# Patient Record
Sex: Female | Born: 1940
Health system: Southern US, Community
[De-identification: ages and names within clinical notes are randomized; demographics above are authoritative.]

## PROBLEM LIST (undated history)

## (undated) DIAGNOSIS — Z87440 Personal history of urinary (tract) infections: Secondary | ICD-10-CM

## (undated) DIAGNOSIS — T8859XA Other complications of anesthesia, initial encounter: Secondary | ICD-10-CM

## (undated) DIAGNOSIS — J189 Pneumonia, unspecified organism: Secondary | ICD-10-CM

## (undated) DIAGNOSIS — M810 Age-related osteoporosis without current pathological fracture: Secondary | ICD-10-CM

## (undated) DIAGNOSIS — K649 Unspecified hemorrhoids: Secondary | ICD-10-CM

## (undated) DIAGNOSIS — I10 Essential (primary) hypertension: Secondary | ICD-10-CM

## (undated) DIAGNOSIS — K6289 Other specified diseases of anus and rectum: Secondary | ICD-10-CM

## (undated) DIAGNOSIS — H269 Unspecified cataract: Secondary | ICD-10-CM

## (undated) DIAGNOSIS — Z8719 Personal history of other diseases of the digestive system: Secondary | ICD-10-CM

## (undated) DIAGNOSIS — R32 Unspecified urinary incontinence: Secondary | ICD-10-CM

## (undated) DIAGNOSIS — J45909 Unspecified asthma, uncomplicated: Secondary | ICD-10-CM

## (undated) DIAGNOSIS — Z860100 Personal history of colon polyps, unspecified: Secondary | ICD-10-CM

## (undated) DIAGNOSIS — Z87442 Personal history of urinary calculi: Secondary | ICD-10-CM

## (undated) DIAGNOSIS — K5790 Diverticulosis of intestine, part unspecified, without perforation or abscess without bleeding: Secondary | ICD-10-CM

## (undated) DIAGNOSIS — N200 Calculus of kidney: Secondary | ICD-10-CM

## (undated) DIAGNOSIS — K219 Gastro-esophageal reflux disease without esophagitis: Secondary | ICD-10-CM

## (undated) DIAGNOSIS — A630 Anogenital (venereal) warts: Secondary | ICD-10-CM

## (undated) DIAGNOSIS — T7840XA Allergy, unspecified, initial encounter: Secondary | ICD-10-CM

## (undated) DIAGNOSIS — J439 Emphysema, unspecified: Secondary | ICD-10-CM

## (undated) DIAGNOSIS — C4490 Unspecified malignant neoplasm of skin, unspecified: Secondary | ICD-10-CM

## (undated) DIAGNOSIS — J449 Chronic obstructive pulmonary disease, unspecified: Secondary | ICD-10-CM

## (undated) HISTORY — DX: Personal history of colon polyps, unspecified: Z86.0100

## (undated) HISTORY — PX: EYE SURGERY: SHX253

## (undated) HISTORY — DX: Unspecified hemorrhoids: K64.9

## (undated) HISTORY — DX: Anogenital (venereal) warts: A63.0

## (undated) HISTORY — DX: Personal history of colonic polyps: Z86.010

## (undated) HISTORY — DX: Unspecified urinary incontinence: R32

## (undated) HISTORY — PX: COLONOSCOPY: SHX174

## (undated) HISTORY — DX: Personal history of urinary (tract) infections: Z87.440

## (undated) HISTORY — DX: Other specified diseases of anus and rectum: K62.89

## (undated) HISTORY — DX: Allergy, unspecified, initial encounter: T78.40XA

## (undated) HISTORY — DX: Gastro-esophageal reflux disease without esophagitis: K21.9

## (undated) HISTORY — DX: Diverticulosis of intestine, part unspecified, without perforation or abscess without bleeding: K57.90

## (undated) HISTORY — DX: Unspecified asthma, uncomplicated: J45.909

## (undated) HISTORY — DX: Chronic obstructive pulmonary disease, unspecified: J44.9

## (undated) HISTORY — DX: Age-related osteoporosis without current pathological fracture: M81.0

## (undated) HISTORY — DX: Emphysema, unspecified: J43.9

## (undated) HISTORY — DX: Unspecified cataract: H26.9

## (undated) HISTORY — PX: DILATION AND CURETTAGE OF UTERUS: SHX78

---
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
## 1999-02-12 ENCOUNTER — Ambulatory Visit: Admission: RE | Admit: 1999-02-12 | Discharge: 1999-02-12 | Payer: Self-pay | Admitting: Pulmonary Disease

## 2000-11-25 ENCOUNTER — Encounter: Admission: RE | Admit: 2000-11-25 | Discharge: 2000-11-25 | Payer: Self-pay | Admitting: Family Medicine

## 2000-11-25 ENCOUNTER — Encounter: Payer: Self-pay | Admitting: Family Medicine

## 2001-07-08 ENCOUNTER — Other Ambulatory Visit: Admission: RE | Admit: 2001-07-08 | Discharge: 2001-07-08 | Payer: Self-pay | Admitting: Family Medicine

## 2002-08-30 ENCOUNTER — Other Ambulatory Visit: Admission: RE | Admit: 2002-08-30 | Discharge: 2002-08-30 | Payer: Self-pay | Admitting: Family Medicine

## 2003-09-01 ENCOUNTER — Other Ambulatory Visit: Admission: RE | Admit: 2003-09-01 | Discharge: 2003-09-01 | Payer: Self-pay | Admitting: Family Medicine

## 2004-05-06 HISTORY — PX: INNER EAR SURGERY: SHX679

## 2004-12-07 ENCOUNTER — Other Ambulatory Visit: Admission: RE | Admit: 2004-12-07 | Discharge: 2004-12-07 | Payer: Self-pay | Admitting: Family Medicine

## 2005-10-04 ENCOUNTER — Encounter: Admission: RE | Admit: 2005-10-04 | Discharge: 2005-10-04 | Payer: Self-pay | Admitting: Family Medicine

## 2006-07-21 ENCOUNTER — Encounter: Admission: RE | Admit: 2006-07-21 | Discharge: 2006-07-21 | Payer: Self-pay | Admitting: Family Medicine

## 2007-02-05 ENCOUNTER — Encounter: Admission: RE | Admit: 2007-02-05 | Discharge: 2007-02-05 | Payer: Self-pay | Admitting: Family Medicine

## 2007-06-08 ENCOUNTER — Encounter: Admission: RE | Admit: 2007-06-08 | Discharge: 2007-06-08 | Payer: Self-pay | Admitting: Family Medicine

## 2007-10-28 ENCOUNTER — Emergency Department (HOSPITAL_COMMUNITY): Admission: EM | Admit: 2007-10-28 | Discharge: 2007-10-28 | Payer: Self-pay | Admitting: Family Medicine

## 2007-11-10 ENCOUNTER — Encounter: Admission: RE | Admit: 2007-11-10 | Discharge: 2007-11-10 | Payer: Self-pay | Admitting: Family Medicine

## 2009-02-04 DIAGNOSIS — F17200 Nicotine dependence, unspecified, uncomplicated: Secondary | ICD-10-CM | POA: Insufficient documentation

## 2009-12-17 ENCOUNTER — Emergency Department (HOSPITAL_COMMUNITY): Admission: EM | Admit: 2009-12-17 | Discharge: 2009-12-17 | Payer: Self-pay | Admitting: Emergency Medicine

## 2010-11-19 ENCOUNTER — Encounter: Payer: Self-pay | Admitting: Gastroenterology

## 2011-01-31 LAB — POCT URINALYSIS DIP (DEVICE)
Glucose, UA: NEGATIVE
Nitrite: POSITIVE — AB
Operator id: 239701
Protein, ur: NEGATIVE
Specific Gravity, Urine: 1.02
Urobilinogen, UA: 0.2
pH: 5.5

## 2011-03-13 ENCOUNTER — Other Ambulatory Visit: Payer: Self-pay | Admitting: Neurosurgery

## 2011-03-13 DIAGNOSIS — E236 Other disorders of pituitary gland: Secondary | ICD-10-CM

## 2011-03-20 ENCOUNTER — Ambulatory Visit
Admission: RE | Admit: 2011-03-20 | Discharge: 2011-03-20 | Disposition: A | Discharge status: Home / Self Care | Payer: Medicare Other | Source: Ambulatory Visit | Attending: Neurosurgery | Admitting: Neurosurgery

## 2011-03-20 DIAGNOSIS — E236 Other disorders of pituitary gland: Secondary | ICD-10-CM

## 2011-03-20 MED ORDER — GADOBENATE DIMEGLUMINE 529 MG/ML IV SOLN
10.0000 mL | Freq: Once | INTRAVENOUS | Status: AC | PRN
  Administered 2011-03-20: 10 mL via INTRAVENOUS

## 2011-09-19 ENCOUNTER — Encounter: Payer: Self-pay | Admitting: Gastroenterology

## 2011-10-22 ENCOUNTER — Encounter: Payer: Self-pay | Admitting: Gastroenterology

## 2011-10-22 ENCOUNTER — Ambulatory Visit (INDEPENDENT_AMBULATORY_CARE_PROVIDER_SITE_OTHER): Payer: Medicare Other | Admitting: Gastroenterology

## 2011-10-22 VITALS — BP 120/64 | HR 80 | Ht 62.0 in | Wt 135.0 lb

## 2011-10-22 DIAGNOSIS — R194 Change in bowel habit: Secondary | ICD-10-CM

## 2011-10-22 DIAGNOSIS — K648 Other hemorrhoids: Secondary | ICD-10-CM

## 2011-10-22 DIAGNOSIS — Z860100 Personal history of colon polyps, unspecified: Secondary | ICD-10-CM

## 2011-10-22 DIAGNOSIS — R198 Other specified symptoms and signs involving the digestive system and abdomen: Secondary | ICD-10-CM

## 2011-10-22 MED ORDER — MOVIPREP 100 G PO SOLR: 1.0000 | Freq: Once | ORAL | Status: DC

## 2011-10-22 MED ORDER — HYDROCORTISONE ACETATE 25 MG RE SUPP: 25.0000 mg | Freq: Two times a day (BID) | RECTAL | Status: AC

## 2011-10-22 NOTE — Assessment & Plan Note (Signed)
Plan followup colonoscopy

## 2011-10-22 NOTE — Assessment & Plan Note (Signed)
Plan Anusol HC suppositories

## 2011-10-22 NOTE — Assessment & Plan Note (Addendum)
A structural abnormality of the colon should be ruled out.  Recommendations #1 colonoscopy

## 2011-10-22 NOTE — Progress Notes (Signed)
History of Present Illness: Ashley Rivera is a 71 year old white female referred at the request of Dr. Osborne Casco for evaluation of change in bowel habits. Over the last few months she's noted excess gas and lower abdominal bloating with discomfort. She may pass large amounts of gas and very small amounts of stool and mucus. She has a history of hemorrhoids and complains of rectal itching and minimal blood on the toilet tissue.  There has been no change in medications or diet. Last colonoscopy in 2005 demonstrated diverticulosis. Adenomatous polyps were removed in 2000.    Past Medical History  Diagnosis Date  . Diverticulosis   . Hx of colonic polyp   . COPD (chronic obstructive pulmonary disease)   . Hx: UTI (urinary tract infection)   . Hemorrhoids    Past Surgical History  Procedure Date  . Inner ear surgery     Tumor removed    family history includes Colon cancer in her paternal grandmother. Current Outpatient Prescriptions  Medication Sig Dispense Refill  . CALCIUM PO Take 1 capsule by mouth 2 (two) times daily.       . cetirizine (ZYRTEC) 10 MG tablet Take 10 mg by mouth daily.      . Cyanocobalamin (VITAMIN B-12 CR PO) Take 1 capsule by mouth daily.      . fish oil-omega-3 fatty acids 1000 MG capsule Take by mouth 2 (two) times daily.       Marland Kitchen LIDOCAINE, ANORECTAL, EX Apply topically as directed.      . nicotine polacrilex (NICORETTE) 2 MG lozenge Place 2 mg inside cheek as directed.      . Tiotropium Bromide Monohydrate (SPIRIVA HANDIHALER IN) Inhale into the lungs as needed.      Marland Kitchen VITAMIN D, CHOLECALCIFEROL, PO Take 1 capsule by mouth daily.       Allergies as of 10/22/2011  . (No Known Allergies)    reports that she has quit smoking. She has never used smokeless tobacco. She reports that she drinks alcohol. She reports that she does not use illicit drugs.     Review of Systems: Pertinent positive and negative review of systems were noted in the above HPI section. All other  review of systems were otherwise negative.  Vital signs were reviewed in today's medical record Physical Exam: General: Well developed , well nourished, no acute distress Head: Normocephalic and atraumatic Eyes:  sclerae anicteric, EOMI Ears: Normal auditory acuity Mouth: No deformity or lesions Neck: Supple, no masses or thyromegaly Lungs: Clear throughout to auscultation Heart: Regular rate and rhythm; no murmurs, rubs or bruits Abdomen: Soft, non tender and non distended. No masses, hepatosplenomegaly or hernias noted. Normal Bowel sounds Rectal: No abnormalities on external exam Musculoskeletal: Symmetrical with no gross deformities  Skin: No lesions on visible extremities Pulses:  Normal pulses noted Extremities: No clubbing, cyanosis, edema or deformities noted Neurological: Alert oriented x 4, grossly nonfocal Cervical Nodes:  No significant cervical adenopathy Inguinal Nodes: No significant inguinal adenopathy Psychological:  Alert and cooperative. Normal mood and affect

## 2011-10-22 NOTE — Patient Instructions (Addendum)
Colonoscopy A colonoscopy is an exam to evaluate your entire colon. In this exam, your colon is cleansed. A long fiberoptic tube is inserted through your rectum and into your colon. The fiberoptic scope (endoscope) is a long bundle of enclosed and very flexible fibers. These fibers transmit light to the area examined and send images from that area to your caregiver. Discomfort is usually minimal. You may be given a drug to help you sleep (sedative) during or prior to the procedure. This exam helps to detect lumps (tumors), polyps, inflammation, and areas of bleeding. Your caregiver may also take a small piece of tissue (biopsy) that will be examined under a microscope. LET YOUR CAREGIVER KNOW ABOUT:   Allergies to food or medicine.   Medicines taken, including vitamins, herbs, eyedrops, over-the-counter medicines, and creams.   Use of steroids (by mouth or creams).   Previous problems with anesthetics or numbing medicines.   History of bleeding problems or blood clots.   Previous surgery.   Other health problems, including diabetes and kidney problems.   Possibility of pregnancy, if this applies.  BEFORE THE PROCEDURE   A clear liquid diet may be required for 2 days before the exam.   Ask your caregiver about changing or stopping your regular medications.   Liquid injections (enemas) or laxatives may be required.   A large amount of electrolyte solution may be given to you to drink over a short period of time. This solution is used to clean out your colon.   You should be present 60 minutes prior to your procedure or as directed by your caregiver.  AFTER THE PROCEDURE   If you received a sedative or pain relieving medication, you will need to arrange for someone to drive you home.   Occasionally, there is a little blood passed with the first bowel movement. Do not be concerned.  FINDING OUT THE RESULTS OF YOUR TEST Not all test results are available during your visit. If your test  results are not back during the visit, make an appointment with your caregiver to find out the results. Do not assume everything is normal if you have not heard from your caregiver or the medical facility. It is important for you to follow up on all of your test results. HOME CARE INSTRUCTIONS   It is not unusual to pass moderate amounts of gas and experience mild abdominal cramping following the procedure. This is due to air being used to inflate your colon during the exam. Walking or a warm pack on your belly (abdomen) may help.   You may resume all normal meals and activities after sedatives and medicines have worn off.   Only take over-the-counter or prescription medicines for pain, discomfort, or fever as directed by your caregiver. Do not use aspirin or blood thinners if a biopsy was taken. Consult your caregiver for medicine usage if biopsies were taken.  SEEK IMMEDIATE MEDICAL CARE IF:   You have a fever.   You pass large blood clots or fill a toilet with blood following the procedure. This may also occur 10 to 14 days following the procedure. This is more likely if a biopsy was taken.   You develop abdominal pain that keeps getting worse and cannot be relieved with medicine.  Document Released: 04/19/2000 Document Revised: 04/11/2011 Document Reviewed: 12/03/2007 [facility] Patient Information 2012 [place].

## 2011-11-12 ENCOUNTER — Encounter: Payer: Self-pay | Admitting: Gastroenterology

## 2011-11-12 ENCOUNTER — Ambulatory Visit (AMBULATORY_SURGERY_CENTER): Payer: Medicare Other | Admitting: Gastroenterology

## 2011-11-12 VITALS — BP 122/71 | HR 92 | Temp 97.4°F | Resp 17 | Ht 62.0 in | Wt 135.0 lb

## 2011-11-12 DIAGNOSIS — K6289 Other specified diseases of anus and rectum: Secondary | ICD-10-CM

## 2011-11-12 DIAGNOSIS — Z86010 Personal history of colonic polyps: Secondary | ICD-10-CM

## 2011-11-12 DIAGNOSIS — R198 Other specified symptoms and signs involving the digestive system and abdomen: Secondary | ICD-10-CM

## 2011-11-12 DIAGNOSIS — K5120 Ulcerative (chronic) proctitis without complications: Secondary | ICD-10-CM

## 2011-11-12 DIAGNOSIS — K648 Other hemorrhoids: Secondary | ICD-10-CM

## 2011-11-12 HISTORY — DX: Other specified diseases of anus and rectum: K62.89

## 2011-11-12 MED ORDER — MESALAMINE 1.2 G PO TBEC: 2.4000 g | DELAYED_RELEASE_TABLET | Freq: Every day | ORAL | Status: DC

## 2011-11-12 MED ORDER — PRAMOXINE HCL 1 % RE FOAM: Freq: Every day | RECTAL | Status: AC

## 2011-11-12 MED ORDER — SODIUM CHLORIDE 0.9 % IV SOLN: 500.0000 mL | INTRAVENOUS | Status: DC

## 2011-11-12 NOTE — Progress Notes (Signed)
Patient did not experience any of the following events: a burn prior to discharge; a fall within the facility; wrong site/side/patient/procedure/implant event; or a hospital transfer or hospital admission upon discharge from the facility. (G8907) Patient did not have preoperative order for IV antibiotic SSI prophylaxis. (G8918)  

## 2011-11-12 NOTE — Progress Notes (Signed)
The pt tolerated the colonoscopy very well. Maw   

## 2011-11-12 NOTE — Op Note (Signed)
[facility] [address] [facility] [address] [place], [place]  [id number]  COLONOSCOPY PROCEDURE REPORT  PATIENT:  Ashley Rivera, Ashley Rivera  MR#:  [id number] BIRTHDATE:  [birth year], 71 yrs. old  GENDER:  female ENDOSCOPIST:  Sandy Salaam. Deatra Ina, MD REF. BY:  Domenick Gong, M.D. PROCEDURE DATE:  11/12/2011 PROCEDURE:  Colonoscopy with biopsy ASA CLASS:  Class II INDICATIONS:  change in bowel habits MEDICATIONS:   MAC sedation, administered by CRNA propofol 238mIV  DESCRIPTION OF PROCEDURE:   After the risks benefits and alternatives of the procedure were thoroughly explained, informed consent was obtained.  Digital rectal exam was performed and revealed no abnormalities.   The LB CF-[id number] was introduced through the anus and advanced to the cecum, which was identified by both the appendix and ileocecal valve, without limitations.  The quality of the prep was excellent, using MoviPrep.  The instrument was then slowly withdrawn as the colon was fully examined. <<PROCEDUREIMAGES>>  FINDINGS:  Proctitis was identified. Moderately severe inflammation with friable, edematous mucosa extending 3cm into rectal vault. Biopsies taken (see image7, image8, and image9). Scattered diverticula were found (see image5). sigmoid to ascending colon  This was otherwise a normal examination of the colon (see image3).   Retroflexed views in the rectum revealed proctitis. The time to cecum =  1) 4.0  minutes. The scope was then withdrawn in 1) 7.0  minutes from the cecum and the procedure completed. COMPLICATIONS:  None ENDOSCOPIC IMPRESSION: 1) Diverticula, scattered 2) Otherwise normal examination 3) Proctitis RECOMMENDATIONS:Begin lialda and proctofoam OV 2 weeks  REPEAT EXAM:  No  ______________________________ RSandy Salaam KDeatra Ina MD  CC:  n. eSIGNED:   RSandy Salaam Kasai Beltran at 11/12/2011 03:21 PM  DDorathy Kinsman [phone]

## 2011-11-12 NOTE — Patient Instructions (Addendum)
YOU HAD AN ENDOSCOPIC PROCEDURE TODAY AT [facility]: Refer to the procedure report that was given to you for any specific questions about what was found during the examination.  If the procedure report does not answer your questions, please call your gastroenterologist to clarify.  If you requested that your care partner not be given the details of your procedure findings, then the procedure report has been included in a sealed envelope for you to review at your convenience later.  YOU SHOULD EXPECT: Some feelings of bloating in the abdomen. Passage of more gas than usual.  Walking can help get rid of the air that was put into your GI tract during the procedure and reduce the bloating. If you had a lower endoscopy (such as a colonoscopy or flexible sigmoidoscopy) you may notice spotting of blood in your stool or on the toilet paper. If you underwent a bowel prep for your procedure, then you may not have a normal bowel movement for a few days.  DIET: Your first meal following the procedure should be a light meal and then it is ok to progress to your normal diet.  A half-sandwich or bowl of soup is an example of a good first meal.  Heavy or fried foods are harder to digest and may make you feel nauseous or bloated.  Likewise meals heavy in dairy and vegetables can cause extra gas to form and this can also increase the bloating.  Drink plenty of fluids but you should avoid alcoholic beverages for 24 hours.  ACTIVITY: Your care partner should take you home directly after the procedure.  You should plan to take it easy, moving slowly for the rest of the day.  You can resume normal activity the day after the procedure however you should NOT DRIVE or use heavy machinery for 24 hours (because of the sedation medicines used during the test).    SYMPTOMS TO REPORT IMMEDIATELY: A gastroenterologist can be reached at any hour.  During normal business hours, 8:30 AM to 5:00 PM Monday through Friday,  call [phone].  After hours and on weekends, please call the GI answering service at [phone] who will take a message and have the physician on call contact you.   Following lower endoscopy (colonoscopy or flexible sigmoidoscopy):  Excessive amounts of blood in the stool  Significant tenderness or worsening of abdominal pains  Swelling of the abdomen that is new, acute  Fever of 100F or higher  FOLLOW UP: If any biopsies were taken you will be contacted by phone or by letter within the next 1-3 weeks.  Call your gastroenterologist if you have not heard about the biopsies in 3 weeks.  Our staff will call the home number listed on your records the next business day following your procedure to check on you and address any questions or concerns that you may have at that time regarding the information given to you following your procedure. This is a courtesy call and so if there is no answer at the home number and we have not heard from you through the emergency physician on call, we will assume that you have returned to your regular daily activities without incident.  SIGNATURES/CONFIDENTIALITY: You and/or your care partner have signed paperwork which will be entered into your electronic medical record.  These signatures attest to the fact that that the information above on your After Visit Summary has been reviewed and is understood.  Full responsibility of the confidentiality of this  discharge information lies with you and/or your care-partner.   Resume medications. Information given on proctitis and diverticulosis with discharge instructions. CALL OFFICE ON THIRD FLOOR AND SCHEDULE FOLLOW APPOINTMENT.

## 2011-11-13 ENCOUNTER — Telehealth: Payer: Self-pay

## 2011-11-13 NOTE — Telephone Encounter (Signed)
Left a message on the pt's answering machine to call if she has any questions or concern. Maw

## 2011-11-19 ENCOUNTER — Encounter: Payer: Self-pay | Admitting: Gastroenterology

## 2011-12-04 ENCOUNTER — Encounter: Payer: Self-pay | Admitting: Gastroenterology

## 2011-12-04 ENCOUNTER — Ambulatory Visit (INDEPENDENT_AMBULATORY_CARE_PROVIDER_SITE_OTHER): Payer: Medicare Other | Admitting: Gastroenterology

## 2011-12-04 ENCOUNTER — Other Ambulatory Visit: Payer: Self-pay

## 2011-12-04 ENCOUNTER — Telehealth: Payer: Self-pay | Admitting: Gastroenterology

## 2011-12-04 VITALS — BP 120/70 | HR 76 | Ht 61.5 in | Wt 134.4 lb

## 2011-12-04 DIAGNOSIS — K6289 Other specified diseases of anus and rectum: Secondary | ICD-10-CM

## 2011-12-04 DIAGNOSIS — Z8719 Personal history of other diseases of the digestive system: Secondary | ICD-10-CM | POA: Insufficient documentation

## 2011-12-04 DIAGNOSIS — L290 Pruritus ani: Secondary | ICD-10-CM

## 2011-12-04 MED ORDER — MEBENDAZOLE 100 MG PO CHEW: CHEWABLE_TABLET | ORAL | Status: DC

## 2011-12-04 MED ORDER — HYDROCORTISONE 2.5 % RE CREA: TOPICAL_CREAM | Freq: Two times a day (BID) | RECTAL | Status: AC

## 2011-12-04 NOTE — Assessment & Plan Note (Addendum)
This may be secondary to hemorrhoidal disease. In view of her history of  possible exposure to pinworm I think it is worthwhile treating.  Recommendations #1 mebendazole 100 mg day one and day 14 #2 Anusol a.c. cream to be used if pruritus continues

## 2011-12-04 NOTE — Patient Instructions (Addendum)
Follow up in 3 months Your medication is being sent to your pharmacy

## 2011-12-04 NOTE — Assessment & Plan Note (Signed)
This is responsible for her change of bowel habits. She has had a good response to lialda. Plan to continue for about 3 months.

## 2011-12-04 NOTE — Telephone Encounter (Signed)
Pt was notified that the medication was sent to the pharmacy I advised her to check with the pharmacy and see if they needed to order it for her because it is an unusual medication.  The pt thanked me for following up so quickly and will call back with any further problems

## 2011-12-04 NOTE — Progress Notes (Signed)
History of Present Illness:  Mrs. Kitchen returns following colonoscopy. This demonstrated proctitis. On Lialda symptoms have improved. She has less gas and more solid stools. ProctoFoam was prescribed but she took that topically rather than by enema.  The patient complains of severe intermittent pruritus at night. She claims that her grandchildren have had pinworm. This is been intermittent problem for several years.    Review of Systems: Pertinent positive and negative review of systems were noted in the above HPI section. All other review of systems were otherwise negative.    Current Medications, Allergies, Past Medical History, Past Surgical History, Family History and Social History were reviewed in [facility] record  Vital signs were reviewed in today's medical record. Physical Exam: General: Well developed , well nourished, no acute distress On rectal exam there are no external abnormalities

## 2011-12-06 ENCOUNTER — Telehealth: Payer: Self-pay | Admitting: Gastroenterology

## 2011-12-06 MED ORDER — MEBENDAZOLE 100 MG PO CHEW: CHEWABLE_TABLET | ORAL | Status: DC

## 2011-12-06 NOTE — Telephone Encounter (Signed)
Resent medication left message for the patient

## 2011-12-12 ENCOUNTER — Other Ambulatory Visit: Payer: Self-pay

## 2012-03-04 ENCOUNTER — Encounter: Payer: Self-pay | Admitting: Gastroenterology

## 2012-03-04 ENCOUNTER — Ambulatory Visit (INDEPENDENT_AMBULATORY_CARE_PROVIDER_SITE_OTHER): Payer: Medicare Other | Admitting: Gastroenterology

## 2012-03-04 VITALS — BP 160/72 | HR 96 | Ht 62.0 in | Wt 134.0 lb

## 2012-03-04 DIAGNOSIS — K6289 Other specified diseases of anus and rectum: Secondary | ICD-10-CM

## 2012-03-04 NOTE — Progress Notes (Signed)
History of Present Illness:  This is a followup visit for Ashley Rivera for  proctitis. She took lialda for 2 months and then discontinue it because of the cost. She had had severe pruritus  which subsided with her medicines. She received a prescription for pinworm but never used the medicine. When she was symptomatic she was having constipation, severe pruritus and mucousy discharge with bleeding.  Currently she is symptom-free.     Review of Systems: Pertinent positive and negative review of systems were noted in the above HPI section. All other review of systems were otherwise negative.    Current Medications, Allergies, Past Medical History, Past Surgical History, Family History and Social History were reviewed in [facility] record  Vital signs were reviewed in today's medical record. Physical Exam: General: Well developed , well nourished, no acute distress

## 2012-03-04 NOTE — Patient Instructions (Addendum)
Follow up as needed with Dr Deatra Ina. CC:  Domenick Gong MD

## 2012-03-04 NOTE — Assessment & Plan Note (Addendum)
Idiopathic proctitis which is in remission. Plan no further therapy. Should he have recurrence then I would try ProctoFoam and/or 5-ASA enemas. Patient was concerned about the high price of lialda.

## 2012-03-05 ENCOUNTER — Ambulatory Visit: Payer: Medicare Other | Admitting: Gastroenterology

## 2012-07-02 ENCOUNTER — Encounter: Payer: Self-pay | Admitting: Gastroenterology

## 2012-07-02 ENCOUNTER — Ambulatory Visit (INDEPENDENT_AMBULATORY_CARE_PROVIDER_SITE_OTHER): Payer: Medicare Other | Admitting: Gastroenterology

## 2012-07-02 VITALS — BP 130/62 | HR 76 | Ht 62.0 in | Wt 141.1 lb

## 2012-07-02 DIAGNOSIS — K6289 Other specified diseases of anus and rectum: Secondary | ICD-10-CM

## 2012-07-02 MED ORDER — PRAMOXINE HCL 1 % RE FOAM: RECTAL | Status: DC

## 2012-07-02 MED ORDER — MESALAMINE 4 G RE ENEM: 4.0000 g | ENEMA | Freq: Every day | RECTAL | Status: DC

## 2012-07-02 NOTE — Patient Instructions (Addendum)
Follow-up in 3 weeks

## 2012-07-02 NOTE — Progress Notes (Signed)
History of Present Illness: Ashley Rivera has returned for reevaluation of diarrhea. Since her last visit she has done well until the last 2 weeks when she is developed recurrent diarrhea with urgency. She's also complaining of lower abdominal and lower back discomfort.  She is on no medications. She indicated that she's had several urinary tract infections in the past year. She has a limited proctitis determined by colonoscopy. While she did well with lialda she found the cost unacceptable.    Past Medical History  Diagnosis Date  . Diverticulosis   . Hx of colonic polyp   . COPD (chronic obstructive pulmonary disease)   . Hx: UTI (urinary tract infection)   . Hemorrhoids   . 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 Inner ear surgery  2006    Tumor removed   . Tonsillectomy and adenoidectomy  1950   family history includes Colon cancer in her paternal grandmother. Current Outpatient Prescriptions  Medication Sig Dispense Refill  . aspirin 81 MG tablet Take 81 mg by mouth daily.      . Budesonide-Formoterol Fumarate (SYMBICORT IN) Inhale into the lungs as needed.      Marland Kitchen CALCIUM PO Take 1 capsule by mouth 2 (two) times daily.       . cetirizine (ZYRTEC) 10 MG tablet Take 10 mg by mouth as needed.       . Cyanocobalamin (VITAMIN B-12 CR PO) Take 1 capsule by mouth daily.      . fish oil-omega-3 fatty acids 1000 MG capsule Take by mouth 2 (two) times daily.       . magnesium gluconate (MAGONATE) 500 MG tablet Take 500 mg by mouth daily.      Marland Kitchen MELATONIN PO Take 1 tablet by mouth at bedtime as needed.      . nicotine polacrilex (NICORETTE) 2 MG lozenge Place 2 mg inside cheek as directed.      . Tiotropium Bromide Monohydrate (SPIRIVA HANDIHALER IN) Inhale into the lungs as needed.      Marland Kitchen VITAMIN D, CHOLECALCIFEROL, PO Take 2,000 mg by mouth daily.        No current facility-administered medications for this visit.   Allergies as of 07/02/2012  . (No Known  Allergies)    reports that she has quit smoking. She has never used smokeless tobacco. She reports that  drinks alcohol. She reports that she does not use illicit drugs.     Review of Systems: Pertinent positive and negative review of systems were noted in the above HPI section. All other review of systems were otherwise negative.  Vital signs were reviewed in today's medical record Physical Exam: General: Well developed , well nourished, no acute distress Skin: anicteric Head: Normocephalic and atraumatic Eyes:  sclerae anicteric, EOMI Ears: Normal auditory acuity Mouth: No deformity or lesions Neck: Supple, no masses or thyromegaly Lungs: Clear throughout to auscultation Heart: Regular rate and rhythm; no murmurs, rubs or bruits Abdomen: Soft, non tender and non distended. No masses, hepatosplenomegaly or hernias noted. Normal Bowel sounds Rectal: There is minimal skin excoriation in the perineal area Musculoskeletal: Symmetrical with no gross deformities  Skin: No lesions on visible extremities Pulses:  Normal pulses noted Extremities: No clubbing, cyanosis, edema or deformities noted Neurological: Alert oriented x 4, grossly nonfocal Cervical Nodes:  No significant cervical adenopathy Inguinal Nodes: No significant inguinal adenopathy Psychological:  Alert and cooperative. Normal mood and affect

## 2012-07-02 NOTE — Assessment & Plan Note (Signed)
Patient has recurrent symptoms of proctitis. I think it is unlikely that this is related to her recurrent urinary tract infections since she is very compulsive about hygiene and contamination.  Recommendations #1 begin Rowasa enemas each bedtime and ProctoFoam every morning for 2 weeks #2 should she not be able to administer these medications properly or she does not have a complete response I will add another mesalamine products orally

## 2012-07-16 ENCOUNTER — Encounter: Payer: Self-pay | Admitting: Gastroenterology

## 2012-07-20 ENCOUNTER — Ambulatory Visit (INDEPENDENT_AMBULATORY_CARE_PROVIDER_SITE_OTHER): Payer: Medicare Other | Admitting: Gastroenterology

## 2012-07-20 ENCOUNTER — Encounter: Payer: Self-pay | Admitting: Gastroenterology

## 2012-07-20 VITALS — BP 136/78 | HR 64 | Ht 62.0 in | Wt 139.4 lb

## 2012-07-20 DIAGNOSIS — K6289 Other specified diseases of anus and rectum: Secondary | ICD-10-CM

## 2012-07-20 MED ORDER — MESALAMINE ER 0.375 G PO CP24: ORAL_CAPSULE | ORAL | Status: DC

## 2012-07-20 NOTE — Progress Notes (Signed)
History of Present Illness:   Patient has returned for followup of proctitis. Symptoms have improved on combination ProctoFoam and Rowasa enemas although they remain. Stools are poorly formed. Frequency has decreased. She still has mild urgency.    Review of Systems: Pertinent positive and negative review of systems were noted in the above HPI section. All other review of systems were otherwise negative.    Current Medications, Allergies, Past Medical History, Past Surgical History, Family History and Social History were reviewed in [facility] record  Vital signs were reviewed in today's medical record. Physical Exam: General: Well developed , well nourished, no acute distress Skin: anicteric

## 2012-07-20 NOTE — Assessment & Plan Note (Addendum)
Idiopathic proctitis which has had a partial response to topical therapy. Will try  apriso 1.5 g daily (lialda too expensive) and requested that the patient call back in 7-10 days to report her progress.

## 2012-08-06 ENCOUNTER — Encounter: Payer: Self-pay | Admitting: Gastroenterology

## 2012-08-26 ENCOUNTER — Encounter: Payer: Self-pay | Admitting: Gastroenterology

## 2012-08-27 ENCOUNTER — Other Ambulatory Visit: Payer: Self-pay | Admitting: Gastroenterology

## 2012-08-27 MED ORDER — SULFASALAZINE 500 MG PO TBEC: 1000.0000 mg | DELAYED_RELEASE_TABLET | Freq: Two times a day (BID) | ORAL | Status: DC

## 2012-10-26 ENCOUNTER — Encounter: Payer: Self-pay | Admitting: Gastroenterology

## 2012-10-26 ENCOUNTER — Ambulatory Visit (INDEPENDENT_AMBULATORY_CARE_PROVIDER_SITE_OTHER): Payer: Medicare Other | Admitting: Gastroenterology

## 2012-10-26 VITALS — BP 110/78 | HR 60 | Ht 62.0 in | Wt 144.2 lb

## 2012-10-26 DIAGNOSIS — K6289 Other specified diseases of anus and rectum: Secondary | ICD-10-CM

## 2012-10-26 NOTE — Assessment & Plan Note (Addendum)
She remains in clinical remission on Azulfidine 500 mg once a day. Plan on continuing with the same. I will check her recent blood work to make sure her renal function is stable

## 2012-10-26 NOTE — Progress Notes (Signed)
History of Present Illness:  The patient has returned for followup of proctitis. On a regimen of Azulfidine 500 mg twice a day she has remained in remission. She lowered the dose from 1 g twice a day to once daily because it caused some fatigue. She has not had occasion  to take ProctoFoam.     Review of Systems: Pertinent positive and negative review of systems were noted in the above HPI section. All other review of systems were otherwise negative.    Current Medications, Allergies, Past Medical History, Past Surgical History, Family History and Social History were reviewed in [place] [place] record  Vital signs were reviewed in today's medical record. Physical Exam: General: Well developed , well nourished, no acute distress

## 2012-10-26 NOTE — Patient Instructions (Addendum)
Follow up in one year  We are having Dr Osborne Casco fax your most recent labs

## 2012-12-09 ENCOUNTER — Other Ambulatory Visit: Payer: Self-pay

## 2013-02-14 ENCOUNTER — Other Ambulatory Visit: Payer: Self-pay | Admitting: Gastroenterology

## 2013-03-11 ENCOUNTER — Other Ambulatory Visit: Payer: Self-pay

## 2013-04-04 ENCOUNTER — Other Ambulatory Visit: Payer: Self-pay | Admitting: Gastroenterology

## 2013-05-02 ENCOUNTER — Emergency Department (HOSPITAL_COMMUNITY): Payer: Medicare Other

## 2013-05-02 ENCOUNTER — Emergency Department (HOSPITAL_COMMUNITY)
Admission: EM | Admit: 2013-05-02 | Discharge: 2013-05-02 | Disposition: A | Discharge status: Home / Self Care | Payer: Medicare Other | Attending: Emergency Medicine | Admitting: Emergency Medicine

## 2013-05-02 ENCOUNTER — Encounter (HOSPITAL_COMMUNITY): Payer: Self-pay | Admitting: Emergency Medicine

## 2013-05-02 DIAGNOSIS — Z87891 Personal history of nicotine dependence: Secondary | ICD-10-CM | POA: Insufficient documentation

## 2013-05-02 DIAGNOSIS — Z87440 Personal history of urinary (tract) infections: Secondary | ICD-10-CM | POA: Insufficient documentation

## 2013-05-02 DIAGNOSIS — Z860100 Personal history of colon polyps, unspecified: Secondary | ICD-10-CM | POA: Insufficient documentation

## 2013-05-02 DIAGNOSIS — J449 Chronic obstructive pulmonary disease, unspecified: Secondary | ICD-10-CM | POA: Insufficient documentation

## 2013-05-02 DIAGNOSIS — W108XXA Fall (on) (from) other stairs and steps, initial encounter: Secondary | ICD-10-CM | POA: Insufficient documentation

## 2013-05-02 DIAGNOSIS — Z8719 Personal history of other diseases of the digestive system: Secondary | ICD-10-CM | POA: Insufficient documentation

## 2013-05-02 DIAGNOSIS — Z87448 Personal history of other diseases of urinary system: Secondary | ICD-10-CM | POA: Insufficient documentation

## 2013-05-02 DIAGNOSIS — S0990XA Unspecified injury of head, initial encounter: Secondary | ICD-10-CM | POA: Insufficient documentation

## 2013-05-02 DIAGNOSIS — Z79899 Other long term (current) drug therapy: Secondary | ICD-10-CM | POA: Insufficient documentation

## 2013-05-02 DIAGNOSIS — R51 Headache: Secondary | ICD-10-CM | POA: Insufficient documentation

## 2013-05-02 DIAGNOSIS — J4489 Other specified chronic obstructive pulmonary disease: Secondary | ICD-10-CM | POA: Insufficient documentation

## 2013-05-02 DIAGNOSIS — Y9301 Activity, walking, marching and hiking: Secondary | ICD-10-CM | POA: Insufficient documentation

## 2013-05-02 DIAGNOSIS — Y929 Unspecified place or not applicable: Secondary | ICD-10-CM | POA: Insufficient documentation

## 2013-05-02 DIAGNOSIS — Z7982 Long term (current) use of aspirin: Secondary | ICD-10-CM | POA: Insufficient documentation

## 2013-05-02 NOTE — ED Notes (Signed)
Pt reports falling when coming out of her back door, fell backwards and hit back of head. Denies loc. Has hematoma to back of head. Reports when she stood up, she had clear liquid like water run out of her nose x 2 episodes.

## 2013-05-02 NOTE — ED Notes (Signed)
Pt rec'd room 31.  CT ready to scan pt.  Mini assessment completed; no distress noted; will fully assess when returned from CT.

## 2013-05-02 NOTE — ED Notes (Signed)
Called ct, pt will be next

## 2013-05-02 NOTE — ED Notes (Signed)
Small reddened area to posterior skull, no break in skin. Pt reports she missed a little step, fell back and struck her head on the brick wall.  Denies LOC.  Denies N/V.  Pupils equal, reactive to light.  Neuro checks WNL.

## 2013-05-02 NOTE — ED Notes (Signed)
Returned from CT.

## 2013-05-03 NOTE — ED Provider Notes (Signed)
CSN: [id number]     Arrival date & time 05/02/13  1623 History   First MD Initiated Contact with Patient 05/02/13 1909     Chief Complaint  Patient presents with  . Fall  . Head Injury    Patient is a 72 y.o. female presenting with fall and head injury. The history is provided by the patient and a significant other.  Fall This is a new problem. The current episode started 3 to 5 hours ago. The problem occurs constantly. The problem has not changed since onset.Associated symptoms include headaches. Pertinent negatives include no chest pain, no abdominal pain and no shortness of breath. Nothing aggravates the symptoms. Nothing relieves the symptoms.  Head Injury Associated symptoms: headache   pt reports missed a step, lost her footing and fell backwards hitting her posterior scalp.  She hit her head on a brick wall.  No LOC.  She reports headache.  No dizziness or weakness.  She reports soon after she had episode of clear fluid draining from her nose.  This has resolved.  No nasal/facial injury reported She looked this up on the internet and became concerned. She takes daily ASA, no other anticoagulants    Past Medical History  Diagnosis Date  . Diverticulosis   . Hx of colonic polyp   . COPD (chronic obstructive pulmonary disease)   . Hx: UTI (urinary tract infection)   . Hemorrhoids   . 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 Inner ear surgery  2006    Tumor removed   . Tonsillectomy and adenoidectomy  1950   Family History  Problem Relation Age of Onset  . Colon cancer Paternal Grandmother    History  Substance Use Topics  . Smoking status: Former Research scientist (life sciences)  . Smokeless tobacco: Never Used  . Alcohol Use: Yes     Comment: 1 or 2 weekly    OB History   Grav Para Term Preterm Abortions TAB SAB Ect Mult Living                 Review of Systems  Eyes: Negative for visual disturbance.  Respiratory: Negative for shortness of breath.    Cardiovascular: Negative for chest pain.  Gastrointestinal: Negative for abdominal pain.  Neurological: Positive for headaches. Negative for syncope and weakness.  All other systems reviewed and are negative.    Allergies  Review of patient's allergies indicates no known allergies.  Home Medications   Current Outpatient Rx  Name  Route  Sig  Dispense  Refill  . aspirin 81 MG tablet   Oral   Take 81 mg by mouth daily.         Marland Kitchen CALCIUM PO   Oral   Take 1 capsule by mouth 2 (two) times daily.          . cetirizine (ZYRTEC) 10 MG tablet   Oral   Take 10 mg by mouth as needed.          . fish oil-omega-3 fatty acids 1000 MG capsule   Oral   Take by mouth 2 (two) times daily.          . magnesium gluconate (MAGONATE) 500 MG tablet   Oral   Take 500 mg by mouth daily.         Marland Kitchen MELATONIN PO   Oral   Take 1 tablet by mouth at bedtime as needed.         . pramoxine (PROCTOFOAM)  1 % foam      Administered every morning   15 g   2   . sulfaSALAzine (AZULFIDINE) 500 MG EC tablet      TAKE 2 TABLETS BY MOUTH TWICE DAILY   60 tablet   0   . tiotropium (SPIRIVA) 18 MCG inhalation capsule   Inhalation   Place 18 mcg into inhaler and inhale daily.         Marland Kitchen trimethoprim (TRIMPEX) 100 MG tablet   Oral   Take 100 mg by mouth daily.          Marland Kitchen VITAMIN D, CHOLECALCIFEROL, PO   Oral   Take 2,000 mg by mouth daily.          . Budesonide-Formoterol Fumarate (SYMBICORT IN)   Inhalation   Inhale 2 puffs into the lungs daily as needed (shortness of breath).           BP 148/79  Pulse 92  Temp(Src) 97.5 F (36.4 C) (Oral)  Resp 16  Ht 5' 2"  (1.575 m)  Wt 148 lb (67.132 kg)  BMI 27.06 kg/m2  SpO2 98% Physical Exam CONSTITUTIONAL: Well developed/well nourished HEAD: soft tissue swelling posterior scalp, no active bleeding or laceration EYES: EOMI/PERRL ENMT: Mucous membranes moist, no nasal/dental trauma, no nasal deformity noted.  No facial  tenderness.   No blood noted in either ear canal.   NECK: supple no meningeal signs SPINE:entire spine nontender CV: S1/S2 noted, no murmurs/rubs/gallops noted LUNGS: Lungs are clear to auscultation bilaterally, no apparent distress ABDOMEN: soft, nontender, no rebound or guarding GU:no cva tenderness NEURO: Pt is awake/alert, moves all extremitiesx4, no arm/leg drift, no facial droop.  GCS 15 EXTREMITIES: pulses normal, full ROM SKIN: warm, color normal PSYCH: no abnormalities of mood noted  ED Course  Procedures (including critical care time) Labs Review Labs Reviewed - No data to display Imaging Review Ct Cervical Spine Wo Contrast  05/02/2013   CLINICAL DATA:  Fall.  EXAM: CT HEAD WITHOUT CONTRAST  CT CERVICAL SPINE WITHOUT CONTRAST  TECHNIQUE: Multidetector CT imaging of the head and cervical spine was performed following the standard protocol without intravenous contrast. Multiplanar CT image reconstructions of the cervical spine were also generated.  COMPARISON:  03/20/2011  FINDINGS: CT HEAD FINDINGS  Mild prominence of the sulci and ventricles noted. The cerebral and cerebellar hemispheres are otherwise normal in attenuation and morphology. The ventricular stress set there is no evidence for acute brain infarct, hemorrhage or mass. There is mild stress set fluid levels identified within the maxillary sinuses noted. The mastoid air cells are clear. The calvarium is intact. Small scalp hematoma is overlying the posterior skull measuring 9 mm in thickness. No underlying fracture identified.  CT CERVICAL SPINE FINDINGS  Normal alignment of the cervical spine. The vertebral body heights are well preserved. There is fusion of the C2 and C3 vertebra. Multi level disc space narrowing and ventral endplate spurring is noted. The facet joints are all well aligned. No fractures or subluxations identified. Biapical scarring and pleural thickening noted.  IMPRESSION: CT head:  1. No acute intracranial  abnormalities. 2. Posterior scalp hematoma  CT cervical spine:  1. Cervical spondylosis. 2. No acute findings identified.   Electronically Signed   By: Kerby Moors M.D.   On: 05/02/2013 19:37    EKG Interpretation   None       MDM   1. Minor head injury without loss of consciousness, initial encounter    Nursing notes including past  medical history and social history reviewed and considered in documentation  Imaging ordered by nursing prior to assessment Ct imaging negative of head.  D/w radiology and there is no signs of intracranial injury.  No signs of previous noted meningioma.  I doubt her issues are CSF rhinorrhea given negative CT imaging and no facial trauma Pt stable/agreeable for d/c home   Sharyon Cable, MD 05/03/13 0020

## 2013-06-28 ENCOUNTER — Other Ambulatory Visit: Payer: Self-pay | Admitting: Gastroenterology

## 2013-07-27 ENCOUNTER — Encounter: Payer: Self-pay | Admitting: Gastroenterology

## 2013-08-02 ENCOUNTER — Other Ambulatory Visit: Payer: Self-pay | Admitting: Gastroenterology

## 2013-09-23 ENCOUNTER — Other Ambulatory Visit: Payer: Self-pay | Admitting: Gastroenterology

## 2013-10-29 ENCOUNTER — Other Ambulatory Visit: Payer: Self-pay | Admitting: Gastroenterology

## 2013-11-08 ENCOUNTER — Encounter: Payer: Self-pay | Admitting: Gastroenterology

## 2013-11-08 ENCOUNTER — Ambulatory Visit (INDEPENDENT_AMBULATORY_CARE_PROVIDER_SITE_OTHER): Payer: Managed Care, Other (non HMO) | Admitting: Gastroenterology

## 2013-11-08 VITALS — BP 130/64 | HR 68 | Ht 62.0 in | Wt 152.0 lb

## 2013-11-08 DIAGNOSIS — K648 Other hemorrhoids: Secondary | ICD-10-CM

## 2013-11-08 DIAGNOSIS — K6289 Other specified diseases of anus and rectum: Secondary | ICD-10-CM

## 2013-11-08 NOTE — Assessment & Plan Note (Addendum)
Rectal leakage and pruritus probably I do to symptomatic hemorrhoids.  Recommendations #1 Anusol HC suppositories #2 the patient has any degree of proctitis I would not do band ligation

## 2013-11-08 NOTE — Progress Notes (Signed)
          History of Present Illness:  The patient has returned for followup of proctitis.  She takes Azulfidine 500 mg twice a day.  She denies rectal bleeding or diarrhea.  She has occasional crampy lower abdominal pain and mild aching.  She reports some rectal itching and occasional weak leakage of stool.    Review of Systems: Pertinent positive and negative review of systems were noted in the above HPI section. All other review of systems were otherwise negative.    Current Medications, Allergies, Past Medical History, Past Surgical History, Family History and Social History were reviewed in [facility] record  Vital signs were reviewed in today's medical record. Physical Exam: General: Well developed , well nourished, no acute distress Skin: anicteric Head: Normocephalic and atraumatic Eyes:  sclerae anicteric, EOMI Ears: Normal auditory acuity Mouth: No deformity or lesions Lungs: Clear throughout to auscultation Heart: Regular rate and rhythm; no murmurs, rubs or bruits Abdomen: Soft, non tender and non distended. No masses, hepatosplenomegaly or hernias noted. Normal Bowel sounds Rectal:deferred Musculoskeletal: Symmetrical with no gross deformities  Pulses:  Normal pulses noted Extremities: No clubbing, cyanosis, edema or deformities noted Neurological: Alert oriented x 4, grossly nonfocal Psychological:  Alert and cooperative. Normal mood and affect  See Assessment and Plan under Problem List

## 2013-11-08 NOTE — Assessment & Plan Note (Addendum)
Patient's very mild symptoms may be independent of proctitis or do to low-grade inflammation.  Recommendations #1 decrease Azulfidine to 1 g twice a day; if improved she will continue at that dose.  If not improved I would consider sigmoidoscopy to evaluate for mucosal healing. #2 review lab work done at PCP office

## 2013-11-08 NOTE — Patient Instructions (Addendum)
Increase sulfasalazine to 1gm twice a day Call back in 2-3 weeks to report progress Your prescription will be sent to your pharmacy

## 2013-11-10 ENCOUNTER — Encounter: Payer: Self-pay | Admitting: Gastroenterology

## 2013-11-10 MED ORDER — HYDROCORTISONE ACETATE 25 MG RE SUPP: 25.0000 mg | Freq: Every day | RECTAL | Status: DC

## 2013-11-11 ENCOUNTER — Other Ambulatory Visit: Payer: Self-pay | Admitting: Gastroenterology

## 2013-11-11 MED ORDER — HYDROCORTISONE ACETATE 25 MG RE SUPP: 25.0000 mg | Freq: Two times a day (BID) | RECTAL | Status: DC

## 2013-11-20 ENCOUNTER — Other Ambulatory Visit: Payer: Self-pay | Admitting: Gastroenterology

## 2013-12-09 ENCOUNTER — Other Ambulatory Visit: Payer: Self-pay | Admitting: Gastroenterology

## 2013-12-28 ENCOUNTER — Other Ambulatory Visit: Payer: Self-pay | Admitting: Gastroenterology

## 2014-01-14 ENCOUNTER — Other Ambulatory Visit: Payer: Self-pay | Admitting: Gastroenterology

## 2014-02-02 ENCOUNTER — Other Ambulatory Visit: Payer: Self-pay | Admitting: Gastroenterology

## 2014-02-22 ENCOUNTER — Other Ambulatory Visit: Payer: Self-pay | Admitting: Gastroenterology

## 2014-03-10 ENCOUNTER — Other Ambulatory Visit: Payer: Self-pay | Admitting: Gastroenterology

## 2014-04-11 ENCOUNTER — Telehealth: Payer: Self-pay | Admitting: Gastroenterology

## 2014-04-11 ENCOUNTER — Encounter: Payer: Self-pay | Admitting: Gastroenterology

## 2014-04-11 ENCOUNTER — Other Ambulatory Visit: Payer: Self-pay | Admitting: Gastroenterology

## 2014-04-11 MED ORDER — SULFASALAZINE 500 MG PO TBEC: 1000.0000 mg | DELAYED_RELEASE_TABLET | Freq: Two times a day (BID) | ORAL | Status: DC

## 2014-04-11 NOTE — Telephone Encounter (Signed)
Med changed to 11 refills

## 2014-05-01 ENCOUNTER — Other Ambulatory Visit: Payer: Self-pay | Admitting: Gastroenterology

## 2014-10-31 ENCOUNTER — Other Ambulatory Visit: Payer: Self-pay

## 2014-10-31 DIAGNOSIS — E559 Vitamin D deficiency, unspecified: Secondary | ICD-10-CM | POA: Insufficient documentation

## 2015-01-13 ENCOUNTER — Other Ambulatory Visit: Payer: Self-pay | Admitting: Gastroenterology

## 2015-01-17 ENCOUNTER — Ambulatory Visit (INDEPENDENT_AMBULATORY_CARE_PROVIDER_SITE_OTHER): Payer: Managed Care, Other (non HMO) | Admitting: Gastroenterology

## 2015-01-17 ENCOUNTER — Encounter: Payer: Self-pay | Admitting: Gastroenterology

## 2015-01-17 VITALS — BP 124/64 | HR 70 | Ht 62.0 in | Wt 152.0 lb

## 2015-01-17 DIAGNOSIS — K6289 Other specified diseases of anus and rectum: Secondary | ICD-10-CM | POA: Diagnosis not present

## 2015-01-17 DIAGNOSIS — K219 Gastro-esophageal reflux disease without esophagitis: Secondary | ICD-10-CM | POA: Insufficient documentation

## 2015-01-17 MED ORDER — SULFASALAZINE 500 MG PO TBEC: 1000.0000 mg | DELAYED_RELEASE_TABLET | Freq: Two times a day (BID) | ORAL | Status: DC

## 2015-01-17 MED ORDER — DEXLANSOPRAZOLE 60 MG PO CPDR: DELAYED_RELEASE_CAPSULE | ORAL | Status: DC

## 2015-01-17 NOTE — Patient Instructions (Signed)
Gastroesophageal Reflux Disease, Adult Gastroesophageal reflux disease (GERD) happens when acid from your stomach flows up into the esophagus. When acid comes in contact with the esophagus, the acid causes soreness (inflammation) in the esophagus. Over time, GERD may create small holes (ulcers) in the lining of the esophagus. CAUSES   Increased body weight. This puts pressure on the stomach, making acid rise from the stomach into the esophagus.  Smoking. This increases acid production in the stomach.  Drinking alcohol. This causes decreased pressure in the lower esophageal sphincter (valve or ring of muscle between the esophagus and stomach), allowing acid from the stomach into the esophagus.  Late evening meals and a full stomach. This increases pressure and acid production in the stomach.  A malformed lower esophageal sphincter. Sometimes, no cause is found. SYMPTOMS   Burning pain in the lower part of the mid-chest behind the breastbone and in the mid-stomach area. This may occur twice a week or more often.  Trouble swallowing.  Sore throat.  Dry cough.  Asthma-like symptoms including chest tightness, shortness of breath, or wheezing. DIAGNOSIS  Your caregiver may be able to diagnose GERD based on your symptoms. In some cases, X-rays and other tests may be done to check for complications or to check the condition of your stomach and esophagus. TREATMENT  Your caregiver may recommend over-the-counter or prescription medicines to help decrease acid production. Ask your caregiver before starting or adding any new medicines.  HOME CARE INSTRUCTIONS   Change the factors that you can control. Ask your caregiver for guidance concerning weight loss, quitting smoking, and alcohol consumption.  Avoid foods and drinks that make your symptoms worse, such as:  Caffeine or alcoholic drinks.  Chocolate.  Peppermint or mint flavorings.  Garlic and onions.  Spicy foods.  Citrus fruits,  such as oranges, lemons, or limes.  Tomato-based foods such as sauce, chili, salsa, and pizza.  Fried and fatty foods.  Avoid lying down for the 3 hours prior to your bedtime or prior to taking a nap.  Eat small, frequent meals instead of large meals.  Wear loose-fitting clothing. Do not wear anything tight around your waist that causes pressure on your stomach.  Raise the head of your bed 6 to 8 inches with wood blocks to help you sleep. Extra pillows will not help.  Only take over-the-counter or prescription medicines for pain, discomfort, or fever as directed by your caregiver.  Do not take aspirin, ibuprofen, or other nonsteroidal anti-inflammatory drugs (NSAIDs). SEEK IMMEDIATE MEDICAL CARE IF:   You have pain in your arms, neck, jaw, teeth, or back.  Your pain increases or changes in intensity or duration.  You develop nausea, vomiting, or sweating (diaphoresis).  You develop shortness of breath, or you faint.  Your vomit is green, yellow, black, or looks like coffee grounds or blood.  Your stool is red, bloody, or black. These symptoms could be signs of other problems, such as heart disease, gastric bleeding, or esophageal bleeding. MAKE SURE YOU:   Understand these instructions.  Will watch your condition.  Will get help right away if you are not doing well or get worse. Document Released: 01/30/2005 Document Revised: 07/15/2011 Document Reviewed: 11/09/2010 [facility] Patient Information 2015 [place], [place]. This information is not intended to replace advice given to you by your health care provider. Make sure you discuss any questions you have with your health care provider.    You have been scheduled for an endoscopy. Please follow written instructions given to  you at your visit today. If you use inhalers (even only as needed), please bring them with you on the day of your procedure. Your physician has requested that you go to www.startemmi.com and enter the  access code given to you at your visit today. This web site gives a general overview about your procedure. However, you should still follow specific instructions given to you by our office regarding your preparation for the procedure.

## 2015-01-17 NOTE — Assessment & Plan Note (Signed)
She is doing well on maintenance Azulfidine.  Plan to continue with the same.

## 2015-01-17 NOTE — Progress Notes (Signed)
      History of Present Illness:  Ms. Cropley as proctitis diagnosed approximately 2013.  It has been well-controlled with Azulfidine.  Her main GI complaint is reflux.  Several months ago she developed severe pyrosis for which she was started on omeprazole.  This helped the burning but she develops worse since about 2 months ago which has not improved.  She has a sense of fullness in her chest between meals.  She denies dysphagia, cough or nausea.  She has occasional sore throat.    Review of Systems: Pertinent positive and negative review of systems were noted in the above HPI section. All other review of systems were otherwise negative.    Current Medications, Allergies, Past Medical History, Past Surgical History, Family History and Social History were reviewed in [facility] record  Vital signs were reviewed in today's medical record. Physical Exam: General: Well developed , well nourished, no acute distress Skin: anicteric Head: Normocephalic and atraumatic Eyes:  sclerae anicteric, EOMI Ears: Normal auditory acuity Mouth: No deformity or lesions Lymph Nodes: no lymphadenopathy Lungs: Clear throughout to auscultation Heart: Regular rate and rhythm; no murmurs, rubs or brui: Gastroinestinal:  Soft, non tender and non distended. No masses, hepatosplenomegaly or hernias noted. Normal Bowel sounds.  There is no succussion splash Rectal:deferred Musculoskeletal: Symmetrical with no gross deformities  Pulses:  Normal pulses noted Extremities: No clubbing, cyanosis, edema or deformities noted Neurological: Alert oriented x 4, grossly nonfocal Psychological:  Alert and cooperative. Normal mood and affect  See Assessment and Plan under Problem List

## 2015-01-17 NOTE — Assessment & Plan Note (Signed)
This is a is a relatively new problem.  Symptoms are only partially improved with omeprazole.  Recommendations #1 upper endoscopy #2 trial of dexilant 60 mg before breakfast

## 2015-01-19 ENCOUNTER — Encounter: Payer: Self-pay | Admitting: Gastroenterology

## 2015-01-23 MED ORDER — SULFASALAZINE 500 MG PO TBEC: 1000.0000 mg | DELAYED_RELEASE_TABLET | Freq: Two times a day (BID) | ORAL | Status: DC

## 2015-01-26 ENCOUNTER — Ambulatory Visit (AMBULATORY_SURGERY_CENTER): Payer: Managed Care, Other (non HMO) | Admitting: Gastroenterology

## 2015-01-26 ENCOUNTER — Encounter: Payer: Self-pay | Admitting: Gastroenterology

## 2015-01-26 VITALS — BP 132/68 | HR 73 | Temp 97.8°F | Resp 19 | Ht 62.0 in | Wt 152.0 lb

## 2015-01-26 DIAGNOSIS — K222 Esophageal obstruction: Secondary | ICD-10-CM

## 2015-01-26 DIAGNOSIS — K219 Gastro-esophageal reflux disease without esophagitis: Secondary | ICD-10-CM | POA: Diagnosis present

## 2015-01-26 MED ORDER — SODIUM CHLORIDE 0.9 % IV SOLN: 500.0000 mL | INTRAVENOUS | Status: DC

## 2015-01-26 MED ORDER — PANTOPRAZOLE SODIUM 40 MG PO TBEC: 40.0000 mg | DELAYED_RELEASE_TABLET | Freq: Every day | ORAL | Status: DC

## 2015-01-26 NOTE — Op Note (Addendum)
[facility]  [address] [place], [id number]   ENDOSCOPY PROCEDURE REPORT  PATIENT: Ashley Rivera, Ashley Rivera  MR#: [id number] BIRTHDATE: 09-26-1940 , 74  yrs. old GENDER: female ENDOSCOPIST: Inda Castle, MD REFERRED BY:  Domenick Gong, M.D. PROCEDURE DATE:  01/26/2015 PROCEDURE:  EGD, diagnostic ASA CLASS:     Class II INDICATIONS:  heartburn. MEDICATIONS: Monitored anesthesia care and Propofol 100 mg IV TOPICAL ANESTHETIC:  DESCRIPTION OF PROCEDURE: After the risks benefits and alternatives of the procedure were thoroughly explained, informed consent was obtained.  The LB [id number] endoscope was introduced through the mouth and advanced to the second portion of the duodenum , Without limitations.  The instrument was slowly withdrawn as the mucosa was fully examined.    ESOPHAGUS: There was a peptic stricture at the gastroesophageal junction.  The stricture was easily traversable.   Except for the findings listed, the EGD was otherwise normal.   STOMACH: A 4 cm hiatal hernia was noted.  Retroflexed views revealed no abnormalities.     The scope was then withdrawn from the patient and the procedure completed.  COMPLICATIONS: There were no immediate complications.  ENDOSCOPIC IMPRESSION: 1.   There was a stricture at the gastroesophageal junction 2.   4 cm hiatal hernia  RECOMMENDATIONS: begin Protonix 40 mg daily Antacids as needed [place] specific 8 weeks  REPEAT EXAM:  eSigned:  Inda Castle, MD 01/26/2015 2:10 PM Revised: 01/26/2015 2:10 PM   CC:

## 2015-01-26 NOTE — Progress Notes (Signed)
Transferred to recovery room. A/O x3, pleased with MAC.  VSS.  Report to [place], Therapist, sports.

## 2015-01-26 NOTE — Patient Instructions (Signed)
YOU HAD AN ENDOSCOPIC PROCEDURE TODAY AT [facility]:   Refer to the procedure report that was given to you for any specific questions about what was found during the examination.  If the procedure report does not answer your questions, please call your gastroenterologist to clarify.  If you requested that your care partner not be given the details of your procedure findings, then the procedure report has been included in a sealed envelope for you to review at your convenience later.  YOU SHOULD EXPECT: Some feelings of bloating in the abdomen. Passage of more gas than usual.  Walking can help get rid of the air that was put into your GI tract during the procedure and reduce the bloating. If you had a lower endoscopy (such as a colonoscopy or flexible sigmoidoscopy) you may notice spotting of blood in your stool or on the toilet paper. If you underwent a bowel prep for your procedure, you may not have a normal bowel movement for a few days.  Please Note:  You might notice some irritation and congestion in your nose or some drainage.  This is from the oxygen used during your procedure.  There is no need for concern and it should clear up in a day or so.  SYMPTOMS TO REPORT IMMEDIATELY:   Following upper endoscopy (EGD)  Vomiting of blood or coffee ground material  New chest pain or pain under the shoulder blades  Painful or persistently difficult swallowing  New shortness of breath  Fever of 100F or higher  Black, tarry-looking stools  For urgent or emergent issues, a gastroenterologist can be reached at any hour by calling [phone].   DIET: Your first meal following the procedure should be a small meal and then it is ok to progress to your normal diet. Heavy or fried foods are harder to digest and may make you feel nauseous or bloated.  Likewise, meals heavy in dairy and vegetables can increase bloating.  Drink plenty of fluids but you should avoid alcoholic beverages for  24 hours.  ACTIVITY:  You should plan to take it easy for the rest of today and you should NOT DRIVE or use heavy machinery until tomorrow (because of the sedation medicines used during the test).    FOLLOW UP: Our staff will call the number listed on your records the next business day following your procedure to check on you and address any questions or concerns that you may have regarding the information given to you following your procedure. If we do not reach you, we will leave a message.  However, if you are feeling well and you are not experiencing any problems, there is no need to return our call.  We will assume that you have returned to your regular daily activities without incident.  If any biopsies were taken you will be contacted by phone or by letter within the next 1-3 weeks.  Please call us at [phone] if you have not heard about the biopsies in 3 weeks.    SIGNATURES/CONFIDENTIALITY: You and/or your care partner have signed paperwork which will be entered into your electronic medical record.  These signatures attest to the fact that that the information above on your After Visit Summary has been reviewed and is understood.  Full responsibility of the confidentiality of this discharge information lies with you and/or your care-partner.  Continue Omeprazole 1-2 times a day Antiacid as needed

## 2015-01-27 ENCOUNTER — Telehealth: Payer: Self-pay | Admitting: *Deleted

## 2015-01-27 NOTE — Telephone Encounter (Signed)
No answer. Number identifier. Message left to call if questions or concerns.

## 2015-03-03 DIAGNOSIS — K449 Diaphragmatic hernia without obstruction or gangrene: Secondary | ICD-10-CM | POA: Insufficient documentation

## 2015-03-27 ENCOUNTER — Ambulatory Visit: Payer: Managed Care, Other (non HMO) | Admitting: Gastroenterology

## 2015-05-03 ENCOUNTER — Telehealth: Payer: Self-pay | Admitting: Gastroenterology

## 2015-05-03 NOTE — Telephone Encounter (Signed)
Patient has a history of proctitis. She has off and on rectal bleeding. New symptoms of a "mucus" bowel movement with rectal blood. She states things have never been really comfortable "down there" since her first case of proctitis. She is on a PPI but still has hoarseness and a full feeling.  Appointment made with extender. And also future appointment with Dr Silverio Decamp.

## 2015-05-11 ENCOUNTER — Ambulatory Visit (INDEPENDENT_AMBULATORY_CARE_PROVIDER_SITE_OTHER): Payer: PPO | Admitting: Physician Assistant

## 2015-05-11 ENCOUNTER — Other Ambulatory Visit (INDEPENDENT_AMBULATORY_CARE_PROVIDER_SITE_OTHER): Payer: PPO

## 2015-05-11 ENCOUNTER — Encounter: Payer: Self-pay | Admitting: Physician Assistant

## 2015-05-11 VITALS — BP 144/64 | HR 84 | Ht 62.0 in | Wt 157.5 lb

## 2015-05-11 DIAGNOSIS — K6289 Other specified diseases of anus and rectum: Secondary | ICD-10-CM

## 2015-05-11 DIAGNOSIS — K219 Gastro-esophageal reflux disease without esophagitis: Secondary | ICD-10-CM

## 2015-05-11 DIAGNOSIS — R1013 Epigastric pain: Secondary | ICD-10-CM

## 2015-05-11 LAB — CBC WITH DIFFERENTIAL/PLATELET
Basophils Absolute: 0.1 10*3/uL (ref 0.0–0.1)
Basophils Relative: 1 % (ref 0.0–3.0)
Eosinophils Absolute: 0.2 10*3/uL (ref 0.0–0.7)
Eosinophils Relative: 3.6 % (ref 0.0–5.0)
HCT: 39.9 % (ref 36.0–46.0)
Hemoglobin: 13.1 g/dL (ref 12.0–15.0)
Lymphocytes Relative: 21 % (ref 12.0–46.0)
Lymphs Abs: 1.2 10*3/uL (ref 0.7–4.0)
MCHC: 32.8 g/dL (ref 30.0–36.0)
MCV: 89.6 fl (ref 78.0–100.0)
Monocytes Absolute: 0.8 10*3/uL (ref 0.1–1.0)
Monocytes Relative: 14.2 % — ABNORMAL HIGH (ref 3.0–12.0)
Neutro Abs: 3.5 10*3/uL (ref 1.4–7.7)
Neutrophils Relative %: 60.2 % (ref 43.0–77.0)
Platelets: 176 10*3/uL (ref 150.0–400.0)
RBC: 4.45 Mil/uL (ref 3.87–5.11)
RDW: 13.9 % (ref 11.5–15.5)
WBC: 5.8 10*3/uL (ref 4.0–10.5)

## 2015-05-11 LAB — COMPREHENSIVE METABOLIC PANEL
ALT: 20 U/L (ref 0–35)
AST: 18 U/L (ref 0–37)
Albumin: 4.1 g/dL (ref 3.5–5.2)
Alkaline Phosphatase: 51 U/L (ref 39–117)
BUN: 17 mg/dL (ref 6–23)
CO2: 31 mEq/L (ref 19–32)
Calcium: 9.7 mg/dL (ref 8.4–10.5)
Chloride: 106 mEq/L (ref 96–112)
Creatinine, Ser: 0.72 mg/dL (ref 0.40–1.20)
GFR: 83.95 mL/min (ref >60.00)
Glucose, Bld: 84 mg/dL (ref 70–99)
Potassium: 3.6 mEq/L (ref 3.5–5.1)
Sodium: 142 mEq/L (ref 135–145)
Total Bilirubin: 0.5 mg/dL (ref 0.2–1.2)
Total Protein: 6.5 g/dL (ref 6.0–8.3)

## 2015-05-11 LAB — C-REACTIVE PROTEIN: CRP: 0.6 mg/dL (ref 0.5–20.0)

## 2015-05-11 MED ORDER — SULFASALAZINE 500 MG PO TBEC: DELAYED_RELEASE_TABLET | ORAL | Status: DC

## 2015-05-11 MED ORDER — MESALAMINE 1000 MG RE SUPP: RECTAL | Status: DC

## 2015-05-11 MED ORDER — FAMOTIDINE 40 MG PO TABS: ORAL_TABLET | ORAL | Status: DC

## 2015-05-11 NOTE — Patient Instructions (Signed)
Please go to the basement level to have your labs drawn.  You have been scheduled for an abdominal ultrasound at [facility] (1st floor of hospital) on 05-17-2015 at 8:30 am . Please arrive 15 minutes prior to your appointment for registration. Make certain not to have anything to eat or drink anything after midnight  prior to your appointment. Should you need to reschedule your appointment, please contact radiology at [phone]. This test typically takes about 30 minutes to perform.  We sent prescriptions to [facility], [address] 1. Pepcid 40 mg 2. Canasa suppositories 3. Azulfidine   Keep your appointment with Dr. Silverio Decamp. 07-03-2015 at 1:30 PM.

## 2015-05-11 NOTE — Progress Notes (Signed)
Patient ID: Ashley Rivera, female   DOB: 11/30/1940, 75 y.o.   MRN: [id number]   Subjective:    Patient ID: Ashley Rivera, female    DOB: Jan 19, 1941, 75 y.o.   MRN: [id number]  HPI [place]  Is a pleasant 75 year old white female former patient of Dr. Kelby Fam with history of GERD, history of adenomatous colon polyp on colonoscopy in 2000 and diagnosis of proctitis made in 2013. She has been maintained on Azulfidine 1000 mg twice a day. Her last colonoscopy was done in July 2013 which showed moderate to severe proctitis , 3 cm into the rectal vault. Also noted scattered diverticulosis from the sigmoid to the ascending colon. Biopsies were taken from the rectum and showed mildly active chronic proctitis.  She also more recently underwent upper endoscopy in September 2016 for complaints of heartburn. She was noted to have a distal esophageal stricture however this was easily traversable with the scope and was not dilated and also noted a 4 cm hiatal hernia.  Patient comes in today because she has been noticing softer stools , mucus and bright red blood on the tissue with bowel movements over the past couple of weeks.. She denies any real abdominal pain or cramping , says she does have some mild rectal  itching at times, and has noticed some increase in  Gas.  she has not been on any recent antibiotics and has not had any new medications etc. She also has multiple questions about acid reflux. Currently using proton X milligrams by mouth every morning which she does feel helps she occasionally takes Tums. In she says still rarely frequently has a feeling of fullness in her chest. She wonders if this may be her gallbladder as her husband had similar symptoms. She says occasionally she gets a mild epigastric pain as well. She is following an antireflux diet.  Review of Systems Pertinent positive and negative review of systems were noted in the above HPI section.  All other review of systems was otherwise  negative.  Outpatient Encounter Prescriptions as of 05/11/2015  Medication Sig  . aspirin 81 MG tablet Take 81 mg by mouth as needed. Reported on 05/11/2015  . Budesonide-Formoterol Fumarate (SYMBICORT IN) Inhale 2 puffs into the lungs daily as needed (shortness of breath).   . CALCIUM PO Take 1 capsule by mouth daily.   . cetirizine (ZYRTEC) 10 MG tablet Take 10 mg by mouth as needed.   . desonide (DESOWEN) 0.05 % cream   . fish oil-omega-3 fatty acids 1000 MG capsule Take by mouth 2 (two) times daily.   Marland Kitchen MELATONIN PO Take 1 tablet by mouth at bedtime as needed.  . pantoprazole (PROTONIX) 40 MG tablet Take 1 tablet (40 mg total) by mouth daily.  Marland Kitchen sulfaSALAzine (AZULFIDINE) 500 MG EC tablet Take 3 tablets twice daily by mouth.  . tiotropium (SPIRIVA) 18 MCG inhalation capsule Place 18 mcg into inhaler and inhale daily.  Marland Kitchen VITAMIN D, CHOLECALCIFEROL, PO Take 2,000 mg by mouth daily.   . [DISCONTINUED] sulfaSALAzine (AZULFIDINE) 500 MG EC tablet Take 2 tablets (1,000 mg total) by mouth 2 (two) times daily.  . famotidine (PEPCID) 40 MG tablet Take 1 tablet by mouth at bedtime daily.  . mesalamine (CANASA) 1000 MG suppository Use 1 suppository twice daily for 30 days then once daily at bedtime only every day.  . VENTOLIN HFA 108 (90 BASE) MCG/ACT inhaler Reported on 05/11/2015  . [DISCONTINUED] trimethoprim (TRIMPEX) 100 MG tablet Take 100 mg by mouth  daily.    No facility-administered encounter medications on file as of 05/11/2015.   No Known Allergies Patient Active Problem List   Diagnosis Date Noted  . GERD (gastroesophageal reflux disease) 01/17/2015  . Pruritus ani 12/04/2011  . Proctitis 12/04/2011  . Personal history of colonic polyps 10/22/2011  . Change in bowel habits 10/22/2011  . Internal hemorrhoids without mention of complication 89/16/9450   Social History   Social History  . Marital Status: Married    Spouse Name: N/A  . Number of Children: 2  . Years of Education: N/A    Occupational History  . Retired     Social History Main Topics  . Smoking status: Former Smoker    Types: Cigarettes  . Smokeless tobacco: Never Used  . Alcohol Use: 0.0 oz/week    0 Standard drinks or equivalent per week     Comment: 1 or 2 weekly   . Drug Use: No  . Sexual Activity: Not on file   Other Topics Concern  . Not on file   Social History Narrative   Daily caffeine     Ms. Lagoy's family history includes Colon cancer in her paternal grandmother. There is no history of Esophageal cancer, Rectal cancer, or Stomach cancer.      Objective:    Filed Vitals:   05/11/15 1354  BP: 144/64  Pulse: 84    Physical Exam   Well-developed older white female in no acute distress, pleasant blood pressure 144/64 pulse 84 height 5 foot 2 weight 157. HEENT; nontraumatic, cephalic EOMI PERRLA sclera anicteric , Cardiovascular; regular rate and rhythm with S1-S2 no murmur or gallop, Pulmonary ;clear bilaterally, Abdomen; soft , basically, nontender there is no palpable mass or hepatosplenomegaly bowel sounds are present, Rectal; exam not done, Extremities; no clubbing cyanosis or edema skin warm and dry, Neuropsych; mood and affect appropriate       Assessment & Plan:    #98  75 year old female with chronic proctitis , maintained on Azulfidine presenting with mild exacerbation noting mucus and bright red blood 2 weeks   #2 chronic GERD  #3 intermittent mild epigastric discomfort rule out gallbladder disease  #4 remote history of adenomatous colon polyp   Plan; Increase Azulfidine to 3 tablets by mouth twice a day for a total of 3000 mg daily   start Canasa suppositories twice a day 1 month then decrease to daily at bedtime  If symptoms improved  strict anti-reflux diet   continue Protonix 40 mg by mouth every morning and add Pepcid 40 mg by mouth daily at bedtime  Schedule upper abdominal ultrasound  Check CBC and CMET  Patient will keep her previously scheduled follow-up  with Dr. Ancil Linsey PA-C 05/11/2015   Cc: Haywood Pao, MD

## 2015-05-15 NOTE — Progress Notes (Signed)
Reviewed and agree with documentation and assessment and plan. K. Veena Kodey Xue , MD   

## 2015-05-17 ENCOUNTER — Ambulatory Visit (HOSPITAL_COMMUNITY)
Admission: RE | Admit: 2015-05-17 | Discharge: 2015-05-17 | Disposition: A | Discharge status: Home / Self Care | Payer: PPO | Source: Ambulatory Visit | Attending: Physician Assistant | Admitting: Physician Assistant

## 2015-05-17 DIAGNOSIS — R1013 Epigastric pain: Secondary | ICD-10-CM

## 2015-05-17 DIAGNOSIS — N200 Calculus of kidney: Secondary | ICD-10-CM | POA: Insufficient documentation

## 2015-05-17 DIAGNOSIS — R109 Unspecified abdominal pain: Secondary | ICD-10-CM | POA: Diagnosis not present

## 2015-07-03 ENCOUNTER — Encounter: Payer: Self-pay | Admitting: Gastroenterology

## 2015-07-03 ENCOUNTER — Ambulatory Visit (INDEPENDENT_AMBULATORY_CARE_PROVIDER_SITE_OTHER): Payer: PPO | Admitting: Gastroenterology

## 2015-07-03 ENCOUNTER — Other Ambulatory Visit: Payer: PPO

## 2015-07-03 VITALS — BP 124/60 | HR 100 | Ht 62.0 in | Wt 156.4 lb

## 2015-07-03 DIAGNOSIS — R197 Diarrhea, unspecified: Secondary | ICD-10-CM | POA: Diagnosis not present

## 2015-07-03 DIAGNOSIS — K6289 Other specified diseases of anus and rectum: Secondary | ICD-10-CM

## 2015-07-03 DIAGNOSIS — K625 Hemorrhage of anus and rectum: Secondary | ICD-10-CM

## 2015-07-03 MED ORDER — NA SULFATE-K SULFATE-MG SULF 17.5-3.13-1.6 GM/177ML PO SOLN: 1.0000 | Freq: Once | ORAL | Status: DC

## 2015-07-03 MED ORDER — HYDROCORTISONE 2.5 % RE CREA: 1.0000 "application " | TOPICAL_CREAM | Freq: Every day | RECTAL | Status: DC

## 2015-07-03 NOTE — Patient Instructions (Signed)
Go to the basement for your lab kit today Follow up in 3 months Take Folate and a multivitamin daily over the counter Use Anusol Rectal Cream as needed daily for 1 week  We will send Anusol to your pharmacy   You have been scheduled for a colonoscopy. Please follow written instructions given to you at your visit today.  Please pick up your prep supplies at the pharmacy within the next 1-3 days. If you use inhalers (even only as needed), please bring them with you on the day of your procedure. Your physician has requested that you go to www.startemmi.com and enter the access code given to you at your visit today. This web site gives a general overview about your procedure. However, you should still follow specific instructions given to you by our office regarding your preparation for the procedure.

## 2015-07-03 NOTE — Progress Notes (Signed)
Ashley Rivera    [id number]    01-Dec-1940  Primary Care Physician:Ashley Sandrea Hughs, MD  Referring Physician: Haywood Pao, MD [address] [place], [place] [address]  Chief complaint:  Blood per rectum, diarrhea  HPI: 10 yr F previously followed by Ashley Rivera, diagnosed with ulcerative proctitis in 2013. Patient here with c/o increased bowel frequency and also blood per rectum, was seen by Ashley Rivera in Jan 2017. She was started on canasa suppository and also increased the dose of sulfasalazine. She continues to have small volume blood per rectum and diarrhea though slightly better. Denies any nausea, vomiting, abdominal pain, weight loss or melena. Last colonoscopy in 2013.   Outpatient Encounter Prescriptions as of 07/03/2015  Medication Sig  . aspirin 81 MG tablet Take 81 mg by mouth as needed. Reported on 05/11/2015  . Budesonide-Formoterol Fumarate (SYMBICORT IN) Inhale 2 puffs into the lungs daily as needed (shortness of breath).   . CALCIUM PO Take 1 capsule by mouth daily.   . cetirizine (ZYRTEC) 10 MG tablet Take 10 mg by mouth as needed.   . Cyanocobalamin (VITAMIN B-12 PO) Take 1 tablet by mouth daily.  Marland Kitchen desonide (DESOWEN) 0.05 % cream   . famotidine (PEPCID) 40 MG tablet Take 1 tablet by mouth at bedtime daily.  . fish oil-omega-3 fatty acids 1000 MG capsule Take by mouth 2 (two) times daily.   Marland Kitchen MELATONIN PO Take 1 tablet by mouth at bedtime as needed.  . mesalamine (CANASA) 1000 MG suppository Use 1 suppository twice daily for 30 days then once daily at bedtime only every day.  . pantoprazole (PROTONIX) 40 MG tablet Take 1 tablet (40 mg total) by mouth daily.  . Probiotic Product (PROBIOTIC PO) Take 1 tablet by mouth every other day.  . sulfaSALAzine (AZULFIDINE) 500 MG EC tablet Take 3 tablets twice daily by mouth.  . tiotropium (SPIRIVA) 18 MCG inhalation capsule Place 18 mcg into inhaler and inhale daily.  . VENTOLIN HFA 108 (90 BASE) MCG/ACT  inhaler Reported on 05/11/2015  . VITAMIN D, CHOLECALCIFEROL, PO Take 2,000 mg by mouth daily.   . hydrocortisone (ANUSOL-HC) 2.5 % rectal cream Place 1 application rectally daily. As Needed for 1 week  . Na Sulfate-K Sulfate-Mg Sulf (SUPREP BOWEL PREP) SOLN Take 1 kit by mouth once.   No facility-administered encounter medications on file as of 07/03/2015.    Allergies as of 07/03/2015  . (No Known Allergies)    Past Medical History  Diagnosis Date  . Diverticulosis   . Hx of colonic polyp   . COPD (chronic obstructive pulmonary disease) ([place])   . Hx: UTI (urinary tract infection)   . Hemorrhoids   . Proctitis 11-12-11  . Cataract     left eye  . Allergy   . Asthma     bronchial asthma  . Emphysema of lung ([place])   . GERD (gastroesophageal reflux disease)   . Osteoporosis     Past Surgical History  Procedure Laterality Date  . Inner ear surgery Right 2006    Tumor removed   . Tonsillectomy and adenoidectomy  1950  . Colonoscopy      Family History  Problem Relation Age of Onset  . Colon cancer Paternal Grandmother   . Esophageal cancer Neg Hx   . Rectal cancer Neg Hx   . Stomach cancer Neg Hx     Social History   Social History  . Marital  Status: Married    Spouse Name: N/A  . Number of Children: 2  . Years of Education: N/A   Occupational History  . Retired     Social History Main Topics  . Smoking status: Former Smoker    Types: Cigarettes  . Smokeless tobacco: Never Used  . Alcohol Use: 0.0 oz/week    0 Standard drinks or equivalent per week     Comment: 1 or 2 weekly   . Drug Use: No  . Sexual Activity: Not on file   Other Topics Concern  . Not on file   Social History Narrative   Daily caffeine       Review of systems: Review of Systems  Constitutional: Negative for fever and chills.  HENT: Negative.   Eyes: Negative for blurred vision.  Respiratory: Negative for cough, shortness of breath and wheezing.   Cardiovascular: Negative for  chest pain and palpitations.  Gastrointestinal: as per HPI Genitourinary: Negative for dysuria, urgency, frequency and hematuria.  Musculoskeletal: Negative for myalgias, back pain and joint pain.  Skin: Negative for itching and rash.  Neurological: Negative for dizziness, tremors, focal weakness, seizures and loss of consciousness.  Endo/Heme/Allergies: Negative for environmental allergies.  Psychiatric/Behavioral: Negative for depression, suicidal ideas and hallucinations.  All other systems reviewed and are negative.   Physical Exam: Filed Vitals:   07/03/15 1318  BP: 124/60  Pulse: 100   Gen:      No acute distress HEENT:  EOMI, sclera anicteric Neck:     No masses; no thyromegaly Lungs:    Clear to auscultation bilaterally; normal respiratory effort CV:         Regular rate and rhythm; no murmurs Abd:      + bowel sounds; soft, non-tender; no palpable masses, no distension Ext:    No edema; adequate peripheral perfusion Skin:      Warm and dry; no rash Neuro: alert and oriented x 3 Psych: normal mood and affect  Data Reviewed:  Reviewed past relevant GI work up   Assessment and Plan/Recommendations: 18 yr F with h/o chronic GERD, ulcerative proctitis with small volume blood per rectum likely due persistent active inflammation with proctitis vs hemorrhoidal hemorrhage vs malignancy Cont Sulfasalazine and canasa suppository at bedtime Will add Anusol suppository daily Schedule for colonoscopy for evaluation Return after procedure  Ashley Rivera , MD [phone] Mon-Fri 8a-5p [phone] after 5p, weekends, holidays

## 2015-07-05 ENCOUNTER — Other Ambulatory Visit: Payer: PPO

## 2015-07-05 DIAGNOSIS — R197 Diarrhea, unspecified: Secondary | ICD-10-CM

## 2015-07-06 LAB — CLOSTRIDIUM DIFFICILE BY PCR: Toxigenic C. Difficile by PCR: NOT DETECTED

## 2015-08-22 ENCOUNTER — Encounter: Payer: PPO | Admitting: Gastroenterology

## 2015-10-04 DIAGNOSIS — D1809 Hemangioma of other sites: Secondary | ICD-10-CM | POA: Insufficient documentation

## 2015-10-04 DIAGNOSIS — Z6827 Body mass index (BMI) 27.0-27.9, adult: Secondary | ICD-10-CM | POA: Diagnosis not present

## 2015-10-04 DIAGNOSIS — D329 Benign neoplasm of meninges, unspecified: Secondary | ICD-10-CM | POA: Diagnosis not present

## 2015-10-20 ENCOUNTER — Other Ambulatory Visit: Payer: Self-pay | Admitting: Neurosurgery

## 2015-10-20 DIAGNOSIS — D329 Benign neoplasm of meninges, unspecified: Secondary | ICD-10-CM

## 2015-10-26 DIAGNOSIS — R3129 Other microscopic hematuria: Secondary | ICD-10-CM | POA: Diagnosis not present

## 2015-10-26 DIAGNOSIS — Z6827 Body mass index (BMI) 27.0-27.9, adult: Secondary | ICD-10-CM | POA: Diagnosis not present

## 2015-10-26 DIAGNOSIS — J449 Chronic obstructive pulmonary disease, unspecified: Secondary | ICD-10-CM | POA: Diagnosis not present

## 2015-10-26 DIAGNOSIS — R300 Dysuria: Secondary | ICD-10-CM | POA: Diagnosis not present

## 2015-10-26 DIAGNOSIS — N390 Urinary tract infection, site not specified: Secondary | ICD-10-CM | POA: Diagnosis not present

## 2015-10-26 DIAGNOSIS — J209 Acute bronchitis, unspecified: Secondary | ICD-10-CM | POA: Diagnosis not present

## 2015-11-06 ENCOUNTER — Ambulatory Visit
Admission: RE | Admit: 2015-11-06 | Discharge: 2015-11-06 | Disposition: A | Discharge status: Home / Self Care | Payer: PPO | Source: Ambulatory Visit | Attending: Neurosurgery | Admitting: Neurosurgery

## 2015-11-06 DIAGNOSIS — D329 Benign neoplasm of meninges, unspecified: Secondary | ICD-10-CM

## 2015-11-06 MED ORDER — GADOBENATE DIMEGLUMINE 529 MG/ML IV SOLN
14.0000 mL | Freq: Once | INTRAVENOUS | Status: AC | PRN
  Administered 2015-11-06: 14 mL via INTRAVENOUS

## 2015-11-08 DIAGNOSIS — E559 Vitamin D deficiency, unspecified: Secondary | ICD-10-CM | POA: Diagnosis not present

## 2015-11-08 DIAGNOSIS — E784 Other hyperlipidemia: Secondary | ICD-10-CM | POA: Diagnosis not present

## 2015-11-11 DIAGNOSIS — D692 Other nonthrombocytopenic purpura: Secondary | ICD-10-CM | POA: Insufficient documentation

## 2015-11-13 DIAGNOSIS — D329 Benign neoplasm of meninges, unspecified: Secondary | ICD-10-CM | POA: Diagnosis not present

## 2015-11-13 DIAGNOSIS — Z6827 Body mass index (BMI) 27.0-27.9, adult: Secondary | ICD-10-CM | POA: Diagnosis not present

## 2015-11-13 DIAGNOSIS — D1809 Hemangioma of other sites: Secondary | ICD-10-CM | POA: Diagnosis not present

## 2015-11-13 DIAGNOSIS — R030 Elevated blood-pressure reading, without diagnosis of hypertension: Secondary | ICD-10-CM | POA: Diagnosis not present

## 2015-11-14 DIAGNOSIS — I872 Venous insufficiency (chronic) (peripheral): Secondary | ICD-10-CM | POA: Diagnosis not present

## 2015-11-14 DIAGNOSIS — R3129 Other microscopic hematuria: Secondary | ICD-10-CM | POA: Diagnosis not present

## 2015-11-14 DIAGNOSIS — K222 Esophageal obstruction: Secondary | ICD-10-CM | POA: Diagnosis not present

## 2015-11-14 DIAGNOSIS — D692 Other nonthrombocytopenic purpura: Secondary | ICD-10-CM | POA: Diagnosis not present

## 2015-11-14 DIAGNOSIS — Z6828 Body mass index (BMI) 28.0-28.9, adult: Secondary | ICD-10-CM | POA: Diagnosis not present

## 2015-11-14 DIAGNOSIS — E559 Vitamin D deficiency, unspecified: Secondary | ICD-10-CM | POA: Diagnosis not present

## 2015-11-14 DIAGNOSIS — D329 Benign neoplasm of meninges, unspecified: Secondary | ICD-10-CM | POA: Diagnosis not present

## 2015-11-14 DIAGNOSIS — K449 Diaphragmatic hernia without obstruction or gangrene: Secondary | ICD-10-CM | POA: Diagnosis not present

## 2015-11-14 DIAGNOSIS — Z1389 Encounter for screening for other disorder: Secondary | ICD-10-CM | POA: Diagnosis not present

## 2015-11-14 DIAGNOSIS — N390 Urinary tract infection, site not specified: Secondary | ICD-10-CM | POA: Diagnosis not present

## 2015-11-14 DIAGNOSIS — Z0000 Encounter for general adult medical examination without abnormal findings: Secondary | ICD-10-CM | POA: Diagnosis not present

## 2015-11-14 DIAGNOSIS — E441 Mild protein-calorie malnutrition: Secondary | ICD-10-CM | POA: Diagnosis not present

## 2015-11-30 DIAGNOSIS — Z6827 Body mass index (BMI) 27.0-27.9, adult: Secondary | ICD-10-CM | POA: Diagnosis not present

## 2015-11-30 DIAGNOSIS — Z1151 Encounter for screening for human papillomavirus (HPV): Secondary | ICD-10-CM | POA: Diagnosis not present

## 2015-11-30 DIAGNOSIS — Z01419 Encounter for gynecological examination (general) (routine) without abnormal findings: Secondary | ICD-10-CM | POA: Diagnosis not present

## 2015-11-30 DIAGNOSIS — N841 Polyp of cervix uteri: Secondary | ICD-10-CM | POA: Diagnosis not present

## 2015-12-25 ENCOUNTER — Ambulatory Visit (INDEPENDENT_AMBULATORY_CARE_PROVIDER_SITE_OTHER): Payer: PPO | Admitting: Obstetrics and Gynecology

## 2015-12-25 ENCOUNTER — Encounter: Payer: Self-pay | Admitting: Obstetrics and Gynecology

## 2015-12-25 VITALS — BP 160/78 | HR 80 | Resp 14 | Ht 62.0 in | Wt 152.6 lb

## 2015-12-25 DIAGNOSIS — R1030 Lower abdominal pain, unspecified: Secondary | ICD-10-CM | POA: Diagnosis not present

## 2015-12-25 DIAGNOSIS — M545 Low back pain: Secondary | ICD-10-CM

## 2015-12-25 DIAGNOSIS — R030 Elevated blood-pressure reading, without diagnosis of hypertension: Secondary | ICD-10-CM | POA: Diagnosis not present

## 2015-12-25 DIAGNOSIS — N841 Polyp of cervix uteri: Secondary | ICD-10-CM

## 2015-12-25 DIAGNOSIS — R102 Pelvic and perineal pain: Secondary | ICD-10-CM

## 2015-12-25 LAB — POCT URINALYSIS DIPSTICK
Bilirubin, UA: NEGATIVE
Glucose, UA: NEGATIVE
Ketones, UA: NEGATIVE
Leukocytes, UA: NEGATIVE
Nitrite, UA: NEGATIVE
Urobilinogen, UA: NEGATIVE
pH, UA: 6

## 2015-12-25 NOTE — Progress Notes (Signed)
75 y.o. [id number] MarriedCaucasianF here for consult of cervical polyp at the request of Dr. Rosana Hoes. The poly was noted at her annual exam in July. She had a normal pap smear at that visit.  No vaginal bleeding. No post coital bleeding. Some entry dyspareunia, helped with a lubricant. She denies a h/o hypertension.  She c/o an intermittent h/o BLQ aching and some lower back pain. It has been going on for at least a year. She mainly notices it when she is still, it can last for hours. It feels like pre-menstrual pain. Pain is a 3/10 in severity. She has a h/o proctitis, chronic, but no current symptoms.  No diarrhea or constipation. No urinary frequency, urgency or pain.     No LMP recorded. Patient is postmenopausal.          Sexually active: Yes.    The current method of family planning is post menopausal status.    Exercising: No.  The patient does not participate in regular exercise at present. Smoker:  no  Health Maintenance: Pap: 11/2015 WNL per patient History of abnormal Pap:  no MMG:  2016 WNL per patient Colonoscopy:  11/12/2011 Proctitis. Repeat 10 years. BMD:   2015 Osteoporosis per patient TDaP: Unsure Gardasil: No   reports that she has quit smoking. Her smoking use included Cigarettes. She has never used smokeless tobacco. She reports that she drinks about 1.2 - 1.8 oz of alcohol per week . She reports that she does not use drugs.  Past Medical History:  Diagnosis Date  . Allergy   . Asthma    bronchial asthma  . Cataract    left eye  . COPD (chronic obstructive pulmonary disease) ([place])   . Diverticulosis   . Emphysema of lung ([place])   . Genital warts    Dx about 30 years ago   . GERD (gastroesophageal reflux disease)   . Hemorrhoids   . Hx of colonic polyp   . Hx: UTI (urinary tract infection)   . Osteoporosis   . Proctitis 11-12-11  . Urinary incontinence    On occasion    Past Surgical History:  Procedure Laterality Date  . COLONOSCOPY    . DILATION AND  CURETTAGE OF UTERUS     50 years ago. Removed rest of placenta from birth  . INNER EAR SURGERY Right 2006   Tumor removed   . TONSILLECTOMY AND ADENOIDECTOMY  1950    Current Outpatient Prescriptions  Medication Sig Dispense Refill  . Budesonide-Formoterol Fumarate (SYMBICORT IN) Inhale 2 puffs into the lungs daily as needed (shortness of breath).     . CALCIUM PO Take 1 capsule by mouth daily.     . cetirizine (ZYRTEC) 10 MG tablet Take 10 mg by mouth as needed.     . Cyanocobalamin (VITAMIN B-12 PO) Take 1 tablet by mouth daily.    . fish oil-omega-3 fatty acids 1000 MG capsule Take by mouth 2 (two) times daily.     . pantoprazole (PROTONIX) 40 MG tablet Take 1 tablet (40 mg total) by mouth daily. 90 tablet 3  . sulfaSALAzine (AZULFIDINE) 500 MG EC tablet Take 3 tablets twice daily by mouth. 180 tablet 11  . tiotropium (SPIRIVA) 18 MCG inhalation capsule Place 18 mcg into inhaler and inhale daily.    . VENTOLIN HFA 108 (90 BASE) MCG/ACT inhaler Reported on 05/11/2015  2  . VITAMIN D, CHOLECALCIFEROL, PO Take 2,000 mg by mouth daily.      No current facility-administered medications  for this visit.     Family History  Problem Relation Age of Onset  . Lung cancer Mother   . Colon cancer Paternal Grandmother   . Breast cancer Maternal Aunt   . Esophageal cancer Neg Hx   . Rectal cancer Neg Hx   . Stomach cancer Neg Hx     Review of Systems  Constitutional: Negative.   HENT: Negative.   Eyes: Negative.   Respiratory: Negative.   Cardiovascular: Negative.   Gastrointestinal: Negative.   Endocrine: Negative.   Genitourinary:       Cervical Polyp   Musculoskeletal: Negative.   Skin: Negative.   Allergic/Immunologic: Negative.   Neurological: Negative.   Hematological: Negative.   Psychiatric/Behavioral: Negative.     Exam:   BP (!) 160/78 (BP Location: Right Arm, Patient Position: Sitting, Cuff Size: Normal)   Pulse 80   Resp 14   Ht 5' 2"  (1.575 m)   Wt 152 lb 9.6  oz (69.2 kg)   BMI 27.91 kg/m   Weight change: @WEIGHTCHANGE @ Height:   Height: 5' 2"  (157.5 cm)  Ht Readings from Last 3 Encounters:  12/25/15 5' 2"  (1.575 m)  07/03/15 5' 2"  (1.575 m)  05/11/15 5' 2"  (1.575 m)    General appearance: alert, cooperative and appears stated age Head: Normocephalic, without obvious abnormality, atraumatic Neck: no adenopathy, supple, symmetrical, trachea midline and thyroid normal to inspection and palpation Lungs: clear to auscultation bilaterally Heart: regular rate and rhythm Abdomen: soft, mildly tender in the left lower quadrant in the area of the descending colon, no masses,  no organomegaly Extremities: extremities normal, atraumatic, no cyanosis or edema Skin: Skin color, texture, turgor normal. No rashes or lesions Lymph nodes: Cervical, supraclavicular, and axillary nodes normal. No abnormal inguinal nodes palpated Neurologic: Grossly normal   Pelvic: External genitalia:  no lesions              Urethra:  normal appearing urethra with no masses, tenderness or lesions              Bartholins and Skenes: normal                 Vagina: normal appearing vagina with normal color and discharge, no lesions              Cervix: small endocervical polyp, within in cervix, unable to remove with a ringed forcep. Biopsy taken with the cervical biopsy forceps. Only the most distal portion of the polyp was removed. Proximal portion to deep in the cervix. Biopsy site treated with silver nitrate               Bimanual Exam:  Uterus:  normal size, contour, position, consistency, mobility, non-tender              Adnexa: no masses, mildly tender in the right adnexa.                Rectovaginal: Confirms               Anus:  normal sphincter tone, no lesions  Chaperone was present for exam.  A:  Cervical polyp  BLQ abdominal pain  Elevated BP without the diagnosis of HTN, she reports "white coat HTN"  P:   Portion of polyp removed (superior portion too  high in the cervix  Return for a GYN ultrasound  Recommended she f/u with GI (colonoscopy was recommended earlier this year)  Will recheck her BP (patient left prior to getting BP,  will check at her f/u visit)  CC: Dr. Rosana Hoes (letter sent)

## 2015-12-26 ENCOUNTER — Telehealth: Payer: Self-pay | Admitting: Obstetrics and Gynecology

## 2015-12-26 ENCOUNTER — Encounter: Payer: Self-pay | Admitting: Obstetrics and Gynecology

## 2015-12-26 NOTE — Telephone Encounter (Signed)
Patient returned call.Spoke with patient regarding benefit for ultrasound. Patient understood and agreeable. Patient ready to schedule. Patient scheduled 01/02/16 with Dr Ashley Rivera. Patient is aware of arrival date and time. As well as cancellation policy. No further questions. Ok to close

## 2015-12-26 NOTE — Telephone Encounter (Signed)
Called patient to review benefits for a recommended procedure. Left Voicemail requesting a call back. °

## 2016-01-02 ENCOUNTER — Ambulatory Visit (INDEPENDENT_AMBULATORY_CARE_PROVIDER_SITE_OTHER): Payer: PPO

## 2016-01-02 ENCOUNTER — Ambulatory Visit (INDEPENDENT_AMBULATORY_CARE_PROVIDER_SITE_OTHER): Payer: PPO | Admitting: Obstetrics and Gynecology

## 2016-01-02 VITALS — BP 150/70 | HR 84 | Resp 16 | Ht 62.0 in | Wt 150.0 lb

## 2016-01-02 DIAGNOSIS — R030 Elevated blood-pressure reading, without diagnosis of hypertension: Secondary | ICD-10-CM | POA: Diagnosis not present

## 2016-01-02 DIAGNOSIS — N200 Calculus of kidney: Secondary | ICD-10-CM

## 2016-01-02 DIAGNOSIS — M545 Low back pain: Secondary | ICD-10-CM

## 2016-01-02 DIAGNOSIS — R1030 Lower abdominal pain, unspecified: Secondary | ICD-10-CM | POA: Diagnosis not present

## 2016-01-02 DIAGNOSIS — R102 Pelvic and perineal pain: Secondary | ICD-10-CM

## 2016-01-02 DIAGNOSIS — N281 Cyst of kidney, acquired: Secondary | ICD-10-CM

## 2016-01-02 DIAGNOSIS — Q6100 Congenital renal cyst, unspecified: Secondary | ICD-10-CM | POA: Diagnosis not present

## 2016-01-02 NOTE — Progress Notes (Signed)
GYNECOLOGY  VISIT   HPI: 75 y.o.   Married  Caucasian  female   G2P2002 with No LMP recorded. Patient is postmenopausal.   here for further evaluation of abdominal discomfort.    GYNECOLOGIC HISTORY: No LMP recorded. Patient is postmenopausal. Contraception: post menopausal  Menopausal hormone therapy: None        OB History    Gravida Para Term Preterm AB Living   2 2 2     2    SAB TAB Ectopic Multiple Live Births           2         Patient Active Problem List   Diagnosis Date Noted  . GERD (gastroesophageal reflux disease) 01/17/2015  . Pruritus ani 12/04/2011  . Proctitis 12/04/2011  . Personal history of colonic polyps 10/22/2011  . Change in bowel habits 10/22/2011  . Internal hemorrhoids without mention of complication 29/47/6546    Past Medical History:  Diagnosis Date  . Allergy   . Asthma    bronchial asthma  . Cataract    left eye  . COPD (chronic obstructive pulmonary disease) ([place])   . Diverticulosis   . Emphysema of lung ([place])   . Genital warts    Dx about 30 years ago   . GERD (gastroesophageal reflux disease)   . Hemorrhoids   . Hx of colonic polyp   . Hx: UTI (urinary tract infection)   . Osteoporosis   . Proctitis 11-12-11  . Urinary incontinence    On occasion    Past Surgical History:  Procedure Laterality Date  . COLONOSCOPY    . DILATION AND CURETTAGE OF UTERUS     50 years ago. Removed rest of placenta from birth  . INNER EAR SURGERY Right 2006   Tumor removed   . TONSILLECTOMY AND ADENOIDECTOMY  1950    Current Outpatient Prescriptions  Medication Sig Dispense Refill  . Budesonide-Formoterol Fumarate (SYMBICORT IN) Inhale 2 puffs into the lungs daily as needed (shortness of breath).     . CALCIUM PO Take 1 capsule by mouth daily.     . cetirizine (ZYRTEC) 10 MG tablet Take 10 mg by mouth as needed.     . Cyanocobalamin (VITAMIN B-12 PO) Take 1 tablet by mouth daily.    . fish oil-omega-3 fatty acids 1000 MG capsule Take by  mouth 2 (two) times daily.     . pantoprazole (PROTONIX) 40 MG tablet Take 1 tablet (40 mg total) by mouth daily. 90 tablet 3  . sulfaSALAzine (AZULFIDINE) 500 MG EC tablet Take 3 tablets twice daily by mouth. 180 tablet 11  . tiotropium (SPIRIVA) 18 MCG inhalation capsule Place 18 mcg into inhaler and inhale daily.    . VENTOLIN HFA 108 (90 BASE) MCG/ACT inhaler Reported on 05/11/2015  2  . VITAMIN D, CHOLECALCIFEROL, PO Take 2,000 mg by mouth daily.      No current facility-administered medications for this visit.      ALLERGIES: Review of patient's allergies indicates no known allergies.  Family History  Problem Relation Age of Onset  . Lung cancer Mother   . Colon cancer Paternal Grandmother   . Breast cancer Maternal Aunt   . Esophageal cancer Neg Hx   . Rectal cancer Neg Hx   . Stomach cancer Neg Hx     Social History   Social History  . Marital status: Married    Spouse name: N/A  . Number of children: 2  . Years of education:  N/A   Occupational History  . Retired     Social History Main Topics  . Smoking status: Former Smoker    Types: Cigarettes  . Smokeless tobacco: Never Used  . Alcohol use 1.2 - 1.8 oz/week    2 - 3 Standard drinks or equivalent per week     Comment: 1 or 2 weekly   . Drug use: No  . Sexual activity: Yes    Partners: Male    Birth control/ protection: Post-menopausal   Other Topics Concern  . Not on file   Social History Narrative   Daily caffeine     Review of Systems  Constitutional: Negative.   HENT: Negative.   Eyes: Negative.   Respiratory: Negative.   Cardiovascular: Negative.   Gastrointestinal: Negative.   Genitourinary: Negative.   Musculoskeletal: Negative.   Skin: Negative.   Neurological: Negative.   Endo/Heme/Allergies: Negative.   Psychiatric/Behavioral: Negative.     PHYSICAL EXAMINATION:    BP (!) 150/70 (BP Location: Right Arm, Patient Position: Sitting, Cuff Size: Normal)   Pulse 84   Resp 16   Ht 5'  2" (1.575 m)   Wt 150 lb (68 kg)   BMI 27.44 kg/m     General appearance: alert, cooperative and appears stated age Breast: no lumps, dimpling or adenopathy Lymph: no cervical or axillary adenopathy   Ultrasound images were reviewed with the patient  ASSESSMENT Abdominal discomfort, no findings on GYN ultrasound to explain her pain Elevated blood pressure, today and at her last visit Limited ultrasound of her kidneys showed a small right kidney stone, a dilated right renal pelvic of 11 mm, left kidney with a 9 mm cyst (patient states cyst and stone are known) The lining of her uterus was slightly thickened for a postmenopausal female, but not to the level to recommend further evaluation without vaginal bleeding Cervical polyp biopsy was benign from her last visit    PLAN F/u with primary for abdominal discomfort, GI also recommended colonoscopy, ? Need for urology referral (will defer to primary) She will check her BP at home a couple of times a day for the next week, then have her BP checked with her primary, she will take her cuff to her primary's to make sure it is working She will call with any vaginal bleeding   An After Visit Summary was printed and given to the patient.  15 minutes face to face time of which over 50% was spent in counseling.   CC: Dr Rosana Hoes

## 2016-01-03 ENCOUNTER — Encounter: Payer: Self-pay | Admitting: Obstetrics and Gynecology

## 2016-01-06 DIAGNOSIS — J189 Pneumonia, unspecified organism: Secondary | ICD-10-CM | POA: Diagnosis not present

## 2016-01-09 DIAGNOSIS — Z6826 Body mass index (BMI) 26.0-26.9, adult: Secondary | ICD-10-CM | POA: Diagnosis not present

## 2016-01-09 DIAGNOSIS — J189 Pneumonia, unspecified organism: Secondary | ICD-10-CM | POA: Diagnosis not present

## 2016-02-02 DIAGNOSIS — Z1231 Encounter for screening mammogram for malignant neoplasm of breast: Secondary | ICD-10-CM | POA: Diagnosis not present

## 2016-02-05 DIAGNOSIS — J449 Chronic obstructive pulmonary disease, unspecified: Secondary | ICD-10-CM | POA: Diagnosis not present

## 2016-03-20 ENCOUNTER — Telehealth: Payer: Self-pay | Admitting: *Deleted

## 2016-03-20 MED ORDER — PANTOPRAZOLE SODIUM 40 MG PO TBEC: 40.0000 mg | DELAYED_RELEASE_TABLET | Freq: Every day | ORAL | 3 refills | Status: DC

## 2016-03-20 NOTE — Telephone Encounter (Signed)
protonix sent to pharmacy per fax refill request

## 2016-04-15 DIAGNOSIS — H0014 Chalazion left upper eyelid: Secondary | ICD-10-CM | POA: Diagnosis not present

## 2016-04-15 DIAGNOSIS — H04123 Dry eye syndrome of bilateral lacrimal glands: Secondary | ICD-10-CM | POA: Diagnosis not present

## 2016-04-15 DIAGNOSIS — H0012 Chalazion right lower eyelid: Secondary | ICD-10-CM | POA: Diagnosis not present

## 2016-04-15 DIAGNOSIS — H16103 Unspecified superficial keratitis, bilateral: Secondary | ICD-10-CM | POA: Diagnosis not present

## 2016-05-01 DIAGNOSIS — H0014 Chalazion left upper eyelid: Secondary | ICD-10-CM | POA: Diagnosis not present

## 2016-05-01 DIAGNOSIS — H52203 Unspecified astigmatism, bilateral: Secondary | ICD-10-CM | POA: Diagnosis not present

## 2016-05-01 DIAGNOSIS — H25813 Combined forms of age-related cataract, bilateral: Secondary | ICD-10-CM | POA: Diagnosis not present

## 2016-05-01 DIAGNOSIS — H1789 Other corneal scars and opacities: Secondary | ICD-10-CM | POA: Diagnosis not present

## 2016-05-08 DIAGNOSIS — H0014 Chalazion left upper eyelid: Secondary | ICD-10-CM | POA: Diagnosis not present

## 2016-06-12 DIAGNOSIS — H1789 Other corneal scars and opacities: Secondary | ICD-10-CM | POA: Diagnosis not present

## 2016-06-12 DIAGNOSIS — H43813 Vitreous degeneration, bilateral: Secondary | ICD-10-CM | POA: Diagnosis not present

## 2016-06-12 DIAGNOSIS — H52203 Unspecified astigmatism, bilateral: Secondary | ICD-10-CM | POA: Diagnosis not present

## 2016-06-12 DIAGNOSIS — H25813 Combined forms of age-related cataract, bilateral: Secondary | ICD-10-CM | POA: Diagnosis not present

## 2016-06-24 ENCOUNTER — Other Ambulatory Visit: Payer: Self-pay | Admitting: Physician Assistant

## 2016-06-25 DIAGNOSIS — H25812 Combined forms of age-related cataract, left eye: Secondary | ICD-10-CM | POA: Diagnosis not present

## 2016-09-05 ENCOUNTER — Other Ambulatory Visit: Payer: Self-pay | Admitting: Gastroenterology

## 2016-09-20 DIAGNOSIS — H04122 Dry eye syndrome of left lacrimal gland: Secondary | ICD-10-CM | POA: Diagnosis not present

## 2016-11-08 DIAGNOSIS — E7800 Pure hypercholesterolemia, unspecified: Secondary | ICD-10-CM | POA: Diagnosis not present

## 2016-11-08 DIAGNOSIS — R8299 Other abnormal findings in urine: Secondary | ICD-10-CM | POA: Diagnosis not present

## 2016-11-08 DIAGNOSIS — E559 Vitamin D deficiency, unspecified: Secondary | ICD-10-CM | POA: Diagnosis not present

## 2016-11-15 ENCOUNTER — Other Ambulatory Visit: Payer: Self-pay | Admitting: Gastroenterology

## 2016-11-15 DIAGNOSIS — K449 Diaphragmatic hernia without obstruction or gangrene: Secondary | ICD-10-CM | POA: Diagnosis not present

## 2016-11-15 DIAGNOSIS — E441 Mild protein-calorie malnutrition: Secondary | ICD-10-CM | POA: Diagnosis not present

## 2016-11-15 DIAGNOSIS — K219 Gastro-esophageal reflux disease without esophagitis: Secondary | ICD-10-CM | POA: Diagnosis not present

## 2016-11-15 DIAGNOSIS — Z6827 Body mass index (BMI) 27.0-27.9, adult: Secondary | ICD-10-CM | POA: Diagnosis not present

## 2016-11-15 DIAGNOSIS — E559 Vitamin D deficiency, unspecified: Secondary | ICD-10-CM | POA: Diagnosis not present

## 2016-11-15 DIAGNOSIS — Z1389 Encounter for screening for other disorder: Secondary | ICD-10-CM | POA: Diagnosis not present

## 2016-11-15 DIAGNOSIS — D329 Benign neoplasm of meninges, unspecified: Secondary | ICD-10-CM | POA: Diagnosis not present

## 2016-11-15 DIAGNOSIS — N390 Urinary tract infection, site not specified: Secondary | ICD-10-CM | POA: Diagnosis not present

## 2016-11-15 DIAGNOSIS — Z0000 Encounter for general adult medical examination without abnormal findings: Secondary | ICD-10-CM | POA: Diagnosis not present

## 2016-11-15 DIAGNOSIS — J449 Chronic obstructive pulmonary disease, unspecified: Secondary | ICD-10-CM | POA: Diagnosis not present

## 2016-11-15 DIAGNOSIS — N841 Polyp of cervix uteri: Secondary | ICD-10-CM | POA: Diagnosis not present

## 2016-11-15 DIAGNOSIS — K222 Esophageal obstruction: Secondary | ICD-10-CM | POA: Diagnosis not present

## 2016-11-21 DIAGNOSIS — H01111 Allergic dermatitis of right upper eyelid: Secondary | ICD-10-CM | POA: Diagnosis not present

## 2016-11-27 DIAGNOSIS — H26492 Other secondary cataract, left eye: Secondary | ICD-10-CM | POA: Diagnosis not present

## 2016-11-27 DIAGNOSIS — H1789 Other corneal scars and opacities: Secondary | ICD-10-CM | POA: Diagnosis not present

## 2016-11-27 DIAGNOSIS — H16232 Neurotrophic keratoconjunctivitis, left eye: Secondary | ICD-10-CM | POA: Diagnosis not present

## 2016-11-27 DIAGNOSIS — H43812 Vitreous degeneration, left eye: Secondary | ICD-10-CM | POA: Diagnosis not present

## 2017-02-06 DIAGNOSIS — Z1231 Encounter for screening mammogram for malignant neoplasm of breast: Secondary | ICD-10-CM | POA: Diagnosis not present

## 2017-02-13 ENCOUNTER — Other Ambulatory Visit: Payer: Self-pay | Admitting: Gastroenterology

## 2017-02-13 DIAGNOSIS — H26492 Other secondary cataract, left eye: Secondary | ICD-10-CM | POA: Diagnosis not present

## 2017-02-13 DIAGNOSIS — H16232 Neurotrophic keratoconjunctivitis, left eye: Secondary | ICD-10-CM | POA: Diagnosis not present

## 2017-02-13 DIAGNOSIS — H04123 Dry eye syndrome of bilateral lacrimal glands: Secondary | ICD-10-CM | POA: Diagnosis not present

## 2017-02-14 NOTE — Telephone Encounter (Signed)
Dr Silverio Decamp This patient has not been seen since 2017 in feb   Can she have sulfasalzine refills?

## 2017-02-21 NOTE — Telephone Encounter (Signed)
Ok to send refill, please schedule follow up appointment

## 2017-02-24 MED ORDER — SULFASALAZINE 500 MG PO TBEC: 1500.0000 mg | DELAYED_RELEASE_TABLET | Freq: Two times a day (BID) | ORAL | 2 refills | Status: DC

## 2017-02-24 NOTE — Addendum Note (Signed)
Addended by: Oda Kilts on: 02/24/2017 04:39 PM   Modules accepted: Orders

## 2017-02-24 NOTE — Telephone Encounter (Signed)
Med ok to send  Made patient a follow up appointment in Dec  Letter mailed today

## 2017-04-30 ENCOUNTER — Ambulatory Visit: Payer: PPO | Admitting: Gastroenterology

## 2017-06-06 ENCOUNTER — Other Ambulatory Visit (INDEPENDENT_AMBULATORY_CARE_PROVIDER_SITE_OTHER): Payer: PPO

## 2017-06-06 ENCOUNTER — Ambulatory Visit (INDEPENDENT_AMBULATORY_CARE_PROVIDER_SITE_OTHER): Payer: PPO | Admitting: Gastroenterology

## 2017-06-06 ENCOUNTER — Encounter: Payer: Self-pay | Admitting: Gastroenterology

## 2017-06-06 VITALS — BP 118/70 | HR 72 | Ht 62.0 in | Wt 153.5 lb

## 2017-06-06 DIAGNOSIS — R140 Abdominal distension (gaseous): Secondary | ICD-10-CM | POA: Diagnosis not present

## 2017-06-06 DIAGNOSIS — K5120 Ulcerative (chronic) proctitis without complications: Secondary | ICD-10-CM

## 2017-06-06 DIAGNOSIS — K449 Diaphragmatic hernia without obstruction or gangrene: Secondary | ICD-10-CM

## 2017-06-06 DIAGNOSIS — R109 Unspecified abdominal pain: Secondary | ICD-10-CM | POA: Diagnosis not present

## 2017-06-06 DIAGNOSIS — K219 Gastro-esophageal reflux disease without esophagitis: Secondary | ICD-10-CM | POA: Diagnosis not present

## 2017-06-06 LAB — VITAMIN B12: Vitamin B-12: 492 pg/mL (ref 211–911)

## 2017-06-06 LAB — COMPREHENSIVE METABOLIC PANEL
ALT: 16 U/L (ref 0–35)
AST: 15 U/L (ref 0–37)
Albumin: 4.2 g/dL (ref 3.5–5.2)
Alkaline Phosphatase: 60 U/L (ref 39–117)
BUN: 19 mg/dL (ref 6–23)
CO2: 28 mEq/L (ref 19–32)
Calcium: 9.6 mg/dL (ref 8.4–10.5)
Chloride: 107 mEq/L (ref 96–112)
Creatinine, Ser: 0.75 mg/dL (ref 0.40–1.20)
GFR: 79.64 mL/min (ref >60.00)
Glucose, Bld: 106 mg/dL — ABNORMAL HIGH (ref 70–99)
Potassium: 3.9 mEq/L (ref 3.5–5.1)
Sodium: 141 mEq/L (ref 135–145)
Total Bilirubin: 0.4 mg/dL (ref 0.2–1.2)
Total Protein: 6.9 g/dL (ref 6.0–8.3)

## 2017-06-06 LAB — CBC WITH DIFFERENTIAL/PLATELET
Basophils Absolute: 0.1 10*3/uL (ref 0.0–0.1)
Basophils Relative: 0.7 % (ref 0.0–3.0)
Eosinophils Absolute: 0.1 10*3/uL (ref 0.0–0.7)
Eosinophils Relative: 1.8 % (ref 0.0–5.0)
HCT: 39 % (ref 36.0–46.0)
Hemoglobin: 13 g/dL (ref 12.0–15.0)
Lymphocytes Relative: 19 % (ref 12.0–46.0)
Lymphs Abs: 1.6 10*3/uL (ref 0.7–4.0)
MCHC: 33.3 g/dL (ref 30.0–36.0)
MCV: 91.1 fl (ref 78.0–100.0)
Monocytes Absolute: 1 10*3/uL (ref 0.1–1.0)
Monocytes Relative: 11.9 % (ref 3.0–12.0)
Neutro Abs: 5.5 10*3/uL (ref 1.4–7.7)
Neutrophils Relative %: 66.6 % (ref 43.0–77.0)
Platelets: 171 10*3/uL (ref 150.0–400.0)
RBC: 4.29 Mil/uL (ref 3.87–5.11)
RDW: 13.1 % (ref 11.5–15.5)
WBC: 8.2 10*3/uL (ref 4.0–10.5)

## 2017-06-06 LAB — IBC PANEL
Iron: 46 ug/dL (ref 42–145)
Saturation Ratios: 12.4 % — ABNORMAL LOW (ref 20.0–50.0)
Transferrin: 264 mg/dL (ref 212.0–360.0)

## 2017-06-06 LAB — C-REACTIVE PROTEIN: CRP: 1 mg/dL (ref 0.5–20.0)

## 2017-06-06 LAB — FOLATE: Folate: >23.6 ng/mL (ref >5.9)

## 2017-06-06 LAB — FERRITIN: Ferritin: 19.6 ng/mL (ref 10.0–291.0)

## 2017-06-06 LAB — SEDIMENTATION RATE: Sed Rate: 5 mm/hr (ref 0–30)

## 2017-06-06 MED ORDER — RANITIDINE HCL 300 MG PO TABS: 300.0000 mg | ORAL_TABLET | Freq: Every day | ORAL | 3 refills | Status: DC

## 2017-06-06 NOTE — Progress Notes (Signed)
Ashley Rivera    [id number]    07-02-1940  Primary Care Physician:Tisovec, Fransico Him, MD  Referring Physician: Haywood Pao, MD [address] [place], [place] [address]  Chief complaint:  Abdominal pain  HPI: 77 year old female with history of chronic GERD, ulcerative proctitis diagnosed in 2013 on chronic sulfasalazine here for follow-up visit.  She is currently taking 500 mg twice daily of sulfasalazine, patient self decreased the dose because she is not having symptoms.  Previously she was taking thousand milligrams twice daily. She started taking OTC hernal medication for Leaky Gut syndrome, is having dark stool not black or marroon.  Occasional heartburn worse at bedtime and she is bothered more by hoarseness of voice when she wakes up, currently not on PPI.  Last EGD September 2016 for evaluation of worsening heartburn, noted to have wide patent distal esophageal stricture, and a 4 cm hiatal hernia.  No esophageal dilation was performed. Patient is worried about potential side effects with chronic PPI use and self discontinued it.  Denies any dysphagia, odynophagia, , nausea or vomiting.  She has abdominal bloating, excessive gas and abdominal discomfort.  Denies any diarrhea or constipation.  No rectal bleeding. Last colonoscopy in 2013.  She had adenomatous polyp removed in 2000.  She is reluctant to undergo surveillance colonoscopy.   Outpatient Encounter Medications as of 06/06/2017  Medication Sig  . Biotin 5000 MCG CAPS Take by mouth daily.  . Budesonide-Formoterol Fumarate (SYMBICORT IN) Inhale 2 puffs into the lungs daily as needed (shortness of breath).   . CALCIUM PO Take 1 capsule by mouth daily.   . cetirizine (ZYRTEC) 10 MG tablet Take 10 mg by mouth as needed.   . Cyanocobalamin (VITAMIN B-12 PO) Take 1 tablet by mouth daily.  . fish oil-omega-3 fatty acids 1000 MG capsule Take by mouth 2 (two) times daily.   . pantoprazole (PROTONIX) 40 MG tablet Take  1 tablet (40 mg total) by mouth daily.  Marland Kitchen sulfaSALAzine (AZULFIDINE) 500 MG EC tablet Take 3 tablets (1,500 mg total) by mouth 2 (two) times daily. (Patient taking differently: Take 1,000 mg by mouth 2 (two) times daily. )  . tiotropium (SPIRIVA) 18 MCG inhalation capsule Place 18 mcg into inhaler and inhale daily.  . VENTOLIN HFA 108 (90 BASE) MCG/ACT inhaler Reported on 05/11/2015  . VITAMIN D, CHOLECALCIFEROL, PO Take 2,000 mg by mouth 2 (two) times daily.    No facility-administered encounter medications on file as of 06/06/2017.     Allergies as of 06/06/2017  . (No Known Allergies)    Past Medical History:  Diagnosis Date  . Allergy   . Asthma    bronchial asthma  . Cataract    left eye  . COPD (chronic obstructive pulmonary disease) ([place])   . Diverticulosis   . Emphysema of lung ([place])   . Genital warts    Dx about 30 years ago   . GERD (gastroesophageal reflux disease)   . Hemorrhoids   . Hx of colonic polyp   . Hx: UTI (urinary tract infection)   . Osteoporosis   . Proctitis 11-12-11  . Urinary incontinence    On occasion    Past Surgical History:  Procedure Laterality Date  . COLONOSCOPY    . DILATION AND CURETTAGE OF UTERUS     50 years ago. Removed rest of placenta from birth  . INNER EAR SURGERY Right 2006   Tumor removed   .  TONSILLECTOMY AND ADENOIDECTOMY  1950    Family History  Problem Relation Age of Onset  . Lung cancer Mother   . Colon cancer Paternal Grandmother   . Breast cancer Maternal Aunt   . Esophageal cancer Neg Hx   . Rectal cancer Neg Hx   . Stomach cancer Neg Hx     Social History   Socioeconomic History  . Marital status: Married    Spouse name: Not on file  . Number of children: 2  . Years of education: Not on file  . Highest education level: Not on file  Social Needs  . Financial resource strain: Not on file  . Food insecurity - worry: Not on file  . Food insecurity - inability: Not on file  . Transportation needs -  medical: Not on file  . Transportation needs - non-medical: Not on file  Occupational History  . Occupation: Retired   Tobacco Use  . Smoking status: Former Smoker    Types: Cigarettes  . Smokeless tobacco: Never Used  Substance and Sexual Activity  . Alcohol use: Yes    Alcohol/week: 1.2 - 1.8 oz    Types: 2 - 3 Standard drinks or equivalent per week    Comment: 1 or 2 weekly   . Drug use: No  . Sexual activity: Yes    Partners: Male    Birth control/protection: Post-menopausal  Other Topics Concern  . Not on file  Social History Narrative   Daily caffeine       Review of systems: Review of Systems  Constitutional: Negative for fever and chills.  HENT: Negative.   Eyes: Negative for blurred vision.  Respiratory: Negative for cough, shortness of breath and wheezing.  History of COPD Cardiovascular: Negative for chest pain and palpitations.  Gastrointestinal: as per HPI Genitourinary: Negative for dysuria, urgency, frequency and hematuria.  Musculoskeletal: Positive for myalgias, back pain and joint pain.  Skin: Negative for itching and rash.  Neurological: Negative for dizziness, tremors, focal weakness, seizures and loss of consciousness.  Endo/Heme/Allergies: Positive for seasonal allergies.  Psychiatric/Behavioral: Negative for depression, suicidal ideas and hallucinations.  All other systems reviewed and are negative.   Physical Exam: Vitals:   06/06/17 0852  BP: 118/70  Pulse: 72   Body mass index is 28.08 kg/m. Gen:      No acute distress HEENT:  EOMI, sclera anicteric Neck:     No masses; no thyromegaly Lungs:    Clear to auscultation bilaterally; normal respiratory effort CV:         Regular rate and rhythm; no murmurs Abd:      + bowel sounds; soft, non-tender; no palpable masses, no distension Ext:    No edema; adequate peripheral perfusion Skin:      Warm and dry; no rash Neuro: alert and oriented x 3 Psych: normal mood and affect  Data  Reviewed:  Reviewed labs, radiology imaging, old records and pertinent past GI work up   Assessment and Plan/Recommendations:  77 year old female with history of hiatal hernia, chronic GERD, ulcerative proctitis here for follow-up visit Advised patient to continue sulfasalazine thousand milligrams twice daily along with folic acid 1 mg daily Follow-up CBC, CMP, B12, folate, ferritin and CRP Patient is reluctant to undergo surveillance colonoscopy, she is worried about potential complications and risks associated with the procedure and anesthesia. I discussed in detail the benefits and risks, call back once she thinks through.  Excessive bloating, gas and abdominal discomfort: IB Gard 1 capsule 3 times  daily as needed We will do a trial of probiotics VSL#3 for 4 weeks  Heartburn and hoarseness of voice: Secondary to GERD EGD 2016 showed hiatal hernia and wide patent distal sebaceous stricture Start ranitidine 300 mg at bedtime as needed Discussed antireflux measures and lifestyle modifications in detail  Return in 6 months or sooner if needed  25 minutes was spent face-to-face with the patient. Greater than 50% of the time used for counseling as well as treatment plan and follow-up. She had multiple questions which were answered to her satisfaction  K. Denzil Magnuson , MD [phone] Mon-Fri 8a-5p [phone] after 5p, weekends, holidays  CC: Tisovec, Fransico Him, MD

## 2017-06-06 NOTE — Patient Instructions (Addendum)
Go to the basement for labs today  Take VSL #3 112 BU twice a day for 4 weeks  Take Over the counter IB Gard 1 capsule three times a day as needed  We will send Zantac to your pharmacy to take at bedtime   Follow up in 6 months   If you are age 77 or older, your body mass index should be between 23-30. Your Body mass index is 28.08 kg/m. If this is out of the aforementioned range listed, please consider follow up with your Primary Care Provider.  If you are age 62 or younger, your body mass index should be between 19-25. Your Body mass index is 28.08 kg/m. If this is out of the aformentioned range listed, please consider follow up with your Primary Care Provider.

## 2017-06-13 ENCOUNTER — Other Ambulatory Visit: Payer: Self-pay | Admitting: Gastroenterology

## 2017-08-14 DIAGNOSIS — N390 Urinary tract infection, site not specified: Secondary | ICD-10-CM | POA: Diagnosis not present

## 2017-08-14 DIAGNOSIS — R350 Frequency of micturition: Secondary | ICD-10-CM | POA: Diagnosis not present

## 2017-09-03 DIAGNOSIS — H25811 Combined forms of age-related cataract, right eye: Secondary | ICD-10-CM | POA: Diagnosis not present

## 2017-09-03 DIAGNOSIS — H26492 Other secondary cataract, left eye: Secondary | ICD-10-CM | POA: Diagnosis not present

## 2017-09-03 DIAGNOSIS — H16232 Neurotrophic keratoconjunctivitis, left eye: Secondary | ICD-10-CM | POA: Diagnosis not present

## 2017-09-03 DIAGNOSIS — Z961 Presence of intraocular lens: Secondary | ICD-10-CM | POA: Diagnosis not present

## 2017-09-16 ENCOUNTER — Encounter: Payer: Self-pay | Admitting: Obstetrics and Gynecology

## 2017-09-16 ENCOUNTER — Ambulatory Visit (INDEPENDENT_AMBULATORY_CARE_PROVIDER_SITE_OTHER): Payer: PPO | Admitting: Obstetrics and Gynecology

## 2017-09-16 ENCOUNTER — Other Ambulatory Visit: Payer: Self-pay

## 2017-09-16 VITALS — BP 122/64 | HR 84 | Resp 16 | Ht 61.5 in | Wt 145.0 lb

## 2017-09-16 DIAGNOSIS — Z01419 Encounter for gynecological examination (general) (routine) without abnormal findings: Secondary | ICD-10-CM

## 2017-09-16 DIAGNOSIS — N3946 Mixed incontinence: Secondary | ICD-10-CM

## 2017-09-16 DIAGNOSIS — M810 Age-related osteoporosis without current pathological fracture: Secondary | ICD-10-CM

## 2017-09-16 DIAGNOSIS — Z124 Encounter for screening for malignant neoplasm of cervix: Secondary | ICD-10-CM | POA: Diagnosis not present

## 2017-09-16 DIAGNOSIS — N764 Abscess of vulva: Secondary | ICD-10-CM

## 2017-09-16 NOTE — Patient Instructions (Addendum)
EXERCISE AND DIET:  We recommended that you start or continue a regular exercise program for good health. Regular exercise means any activity that makes your heart beat faster and makes you sweat.  We recommend exercising at least 30 minutes per day at least 3 days a week, preferably 4 or 5.  We also recommend a diet low in fat and sugar.  Inactivity, poor dietary choices and obesity can cause diabetes, heart attack, stroke, and kidney damage, among others.    ALCOHOL AND SMOKING:  Women should limit their alcohol intake to no more than 7 drinks/beers/glasses of wine (combined, not each!) per week. Moderation of alcohol intake to this level decreases your risk of breast cancer and liver damage. And of course, no recreational drugs are part of a healthy lifestyle.  And absolutely no smoking or even second hand smoke. Most people know smoking can cause heart and lung diseases, but did you know it also contributes to weakening of your bones? Aging of your skin?  Yellowing of your teeth and nails?  CALCIUM AND VITAMIN D:  Adequate intake of calcium and Vitamin D are recommended.  The recommendations for exact amounts of these supplements seem to change often, but generally speaking 600 mg of calcium (either carbonate or citrate) and 800 units of Vitamin D per day seems prudent. Certain women may benefit from higher intake of Vitamin D.  If you are among these women, your doctor will have told you during your visit.    PAP SMEARS:  Pap smears, to check for cervical cancer or precancers,  have traditionally been done yearly, although recent scientific advances have shown that most women can have pap smears less often.  However, every woman still should have a physical exam from her gynecologist every year. It will include a breast check, inspection of the vulva and vagina to check for abnormal growths or skin changes, a visual exam of the cervix, and then an exam to evaluate the size and shape of the uterus and  ovaries.  And after 77 years of age, a rectal exam is indicated to check for rectal cancers. We will also provide age appropriate advice regarding health maintenance, like when you should have certain vaccines, screening for sexually transmitted diseases, bone density testing, colonoscopy, mammograms, etc.   MAMMOGRAMS:  All women over 48 years old should have a yearly mammogram. Many facilities now offer a "3D" mammogram, which may cost around $50 extra out of pocket. If possible,  we recommend you accept the option to have the 3D mammogram performed.  It both reduces the number of women who will be called back for extra views which then turn out to be normal, and it is better than the routine mammogram at detecting truly abnormal areas.    COLONOSCOPY:  Colonoscopy to screen for colon cancer is recommended for all women at age 60.  We know, you hate the idea of the prep.  We agree, BUT, having colon cancer and not knowing it is worse!!  Colon cancer so often starts as a polyp that can be seen and removed at colonscopy, which can quite literally save your life!  And if your first colonoscopy is normal and you have no family history of colon cancer, most women don't have to have it again for 10 years.  Once every ten years, you can do something that may end up saving your life, right?  We will be happy to help you get it scheduled when you are ready.  Be sure to check your insurance coverage so you understand how much it will cost.  It may be covered as a preventative service at no cost, but you should check your particular policy.      Kegel Exercises Kegel exercises help strengthen the muscles that support the rectum, vagina, small intestine, bladder, and uterus. Doing Kegel exercises can help:  Improve bladder and bowel control.  Improve sexual response.  Reduce problems and discomfort during pregnancy.  Kegel exercises involve squeezing your pelvic floor muscles, which are the same muscles you  squeeze when you try to stop the flow of urine. The exercises can be done while sitting, standing, or lying down, but it is best to vary your position. Phase 1 exercises 1. Squeeze your pelvic floor muscles tight. You should feel a tight lift in your rectal area. If you are a female, you should also feel a tightness in your vaginal area. Keep your stomach, buttocks, and legs relaxed. 2. Hold the muscles tight for up to 10 seconds. 3. Relax your muscles. Repeat this exercise 50 times a day or as many times as told by your health care provider. Continue to do this exercise for at least 4-6 weeks or for as long as told by your health care provider. This information is not intended to replace advice given to you by your health care provider. Make sure you discuss any questions you have with your health care provider. Document Released: 04/08/2012 Document Revised: 12/16/2015 Document Reviewed: 03/12/2015 Elsevier Interactive Patient Education  2018 [employer].  Osteoporosis Osteoporosis is the thinning and loss of density in the bones. Osteoporosis makes the bones more brittle, fragile, and likely to break (fracture). Over time, osteoporosis can cause the bones to become so weak that they fracture after a simple fall. The bones most likely to fracture are the bones in the hip, wrist, and spine. What are the causes? The exact cause is not known. What increases the risk? Anyone can develop osteoporosis. You may be at greater risk if you have a family history of the condition or have poor nutrition. You may also have a higher risk if you are:  Female.  35 years old or older.  A smoker.  Not physically active.  White or Asian.  Slender.  What are the signs or symptoms? A fracture might be the first sign of the disease, especially if it results from a fall or injury that would not usually cause a bone to break. Other signs and symptoms include:  Low back and neck pain.  Stooped  posture.  Height loss.  How is this diagnosed? To make a diagnosis, your health care provider may:  Take a medical history.  Perform a physical exam.  Order tests, such as: ? A bone mineral density test. ? A dual-energy X-ray absorptiometry test.  How is this treated? The goal of osteoporosis treatment is to strengthen your bones to reduce your risk of a fracture. Treatment may involve:  Making lifestyle changes, such as: ? Eating a diet rich in calcium. ? Doing weight-bearing and muscle-strengthening exercises. ? Stopping tobacco use. ? Limiting alcohol intake.  Taking medicine to slow the process of bone loss or to increase bone density.  Monitoring your levels of calcium and vitamin D.  Follow these instructions at home:  Include calcium and vitamin D in your diet. Calcium is important for bone health, and vitamin D helps the body absorb calcium.  Perform weight-bearing and muscle-strengthening exercises as directed by your  health care provider.  Do not use any tobacco products, including cigarettes, chewing tobacco, and electronic cigarettes. If you need help quitting, ask your health care provider.  Limit your alcohol intake.  Take medicines only as directed by your health care provider.  Keep all follow-up visits as directed by your health care provider. This is important.  Take precautions at home to lower your risk of falling, such as: ? Keeping rooms well lit and clutter free. ? Installing safety rails on stairs. ? Using rubber mats in the bathroom and other areas that are often wet or slippery. Get help right away if: You fall or injure yourself. This information is not intended to replace advice given to you by your health care provider. Make sure you discuss any questions you have with your health care provider. Document Released: 01/30/2005 Document Revised: 09/25/2015 Document Reviewed: 09/30/2013 Elsevier Interactive Patient Education  [employer].

## 2017-09-16 NOTE — Progress Notes (Signed)
77 y.o. [id number] MarriedCaucasianF here for annual exam.  She c/o a 2 week h/o vulvar lumps, they were tender, she squeezed them and they oozed. Feeling a little better today. Thinks there are 2 lumps, not as big or tender as they were.  No vaginal bleeding. Sexually active, no pain. She has proctitis, under control. She has just occasional abdominal discomfort and bloating. She has occasional urinary leakage with a bad cough, occasional urge incontinence. She can go at least a week without leaking. No change for a few years, tolerable.     No LMP recorded. Patient is postmenopausal.          Sexually active: Yes.    The current method of family planning is post menopausal status.    Exercising: No.  The patient does not participate in regular exercise at present. Smoker:  Former smoker   Health Maintenance: Pap:  2017 History of abnormal Pap:  h/o genital warts.  MMG:  2018 [facility] WNL per patient  Colonoscopy:  11-12-11 Normal  BMD:   Unsure, she will check with her primary, thinks she might have had osteoporosis.   TDaP:  Unsure, with primary  Gardasil: N/A   reports that she has quit smoking. Her smoking use included cigarettes. She has never used smokeless tobacco. She reports that she drinks about 1.2 oz of alcohol per week. She reports that she does not use drugs.  Past Medical History:  Diagnosis Date  . Allergy   . Asthma    bronchial asthma  . Cataract    left eye  . COPD (chronic obstructive pulmonary disease) ([place])   . Diverticulosis   . Emphysema of lung ([place])   . Genital warts    Dx about 30 years ago   . GERD (gastroesophageal reflux disease)   . Hemorrhoids   . Hx of colonic polyp   . Hx: UTI (urinary tract infection)   . Osteoporosis   . Proctitis 11-12-11  . Urinary incontinence    On occasion    Past Surgical History:  Procedure Laterality Date  . COLONOSCOPY    . DILATION AND CURETTAGE OF UTERUS     50 years ago. Removed rest of placenta from birth  .  INNER EAR SURGERY Right 2006   Tumor removed   . TONSILLECTOMY AND ADENOIDECTOMY  1950    Current Outpatient Medications  Medication Sig Dispense Refill  . Biotin 5000 MCG CAPS Take by mouth daily.    . Budesonide-Formoterol Fumarate (SYMBICORT IN) Inhale 2 puffs into the lungs daily as needed (shortness of breath).     . CALCIUM PO Take 1 capsule by mouth daily.     . cetirizine (ZYRTEC) 10 MG tablet Take 10 mg by mouth as needed.     . Cyanocobalamin (VITAMIN B-12 PO) Take 1 tablet by mouth daily.    . fish oil-omega-3 fatty acids 1000 MG capsule Take by mouth 2 (two) times daily.     . pantoprazole (PROTONIX) 40 MG tablet Take 1 tablet (40 mg total) by mouth daily. 90 tablet 3  . Probiotic Product (PROBIOTIC-10 PO) Take by mouth.    . ranitidine (ZANTAC) 300 MG tablet Take 1 tablet (300 mg total) by mouth at bedtime. 30 tablet 3  . sulfaSALAzine (AZULFIDINE) 500 MG EC tablet Take 2 tablets (1,000 mg total) by mouth 2 (two) times daily. 120 tablet 5  . tiotropium (SPIRIVA) 18 MCG inhalation capsule Place 18 mcg into inhaler and inhale daily.    Marland Kitchen  VENTOLIN HFA 108 (90 BASE) MCG/ACT inhaler Reported on 05/11/2015  2  . VITAMIN D, CHOLECALCIFEROL, PO Take 2,000 mg by mouth 2 (two) times daily.      No current facility-administered medications for this visit.     Family History  Problem Relation Age of Onset  . Lung cancer Mother   . Colon cancer Paternal Grandmother   . Breast cancer Maternal Aunt   . Esophageal cancer Neg Hx   . Rectal cancer Neg Hx   . Stomach cancer Neg Hx     Review of Systems  Constitutional: Negative.   HENT: Negative.   Eyes: Negative.   Respiratory: Negative.   Cardiovascular: Negative.   Gastrointestinal: Negative.   Endocrine: Negative.   Genitourinary:       Vaginal lump Night urination   Musculoskeletal: Negative.   Skin: Negative.   Allergic/Immunologic: Negative.   Neurological: Negative.   Hematological: Bruises/bleeds easily.   Psychiatric/Behavioral: Negative.   Nocturia x 1, tolerable  Exam:   BP 122/64 (BP Location: Right Arm, Patient Position: Sitting, Cuff Size: Normal)   Pulse 84   Resp 16   Ht 5' 1.5" (1.562 m)   Wt 145 lb (65.8 kg)   BMI 26.95 kg/m   Weight change: @WEIGHTCHANGE @ Height:   Height: 5' 1.5" (156.2 cm)  Ht Readings from Last 3 Encounters:  09/16/17 5' 1.5" (1.562 m)  06/06/17 5' 2"  (1.575 m)  01/02/16 5' 2"  (1.575 m)    General appearance: alert, cooperative and appears stated age Head: Normocephalic, without obvious abnormality, atraumatic Neck: no adenopathy, supple, symmetrical, trachea midline and thyroid normal to inspection and palpation Lungs: clear to auscultation bilaterally Cardiovascular: regular rate and rhythm Breasts: normal appearance, no masses or tenderness Abdomen: soft, non-tender; non distended,  no masses,  no organomegaly Extremities: extremities normal, atraumatic, no cyanosis or edema Skin: Skin color, texture, turgor normal. No rashes or lesions Lymph nodes: Cervical, supraclavicular, and axillary nodes normal. No abnormal inguinal nodes palpated Neurologic: Grossly normal   Pelvic: External genitalia:  Resolving boil on the left labia majora, excoriated (she squeezed it)              Urethra:  normal appearing urethra with no masses, tenderness or lesions              Bartholins and Skenes: normal                 Vagina: normal appearing atrophic vagina with normal color and discharge, no lesions              Cervix: no lesions               Bimanual Exam:  Uterus:  normal size, contour, position, consistency, mobility, non-tender              Adnexa: no mass, fullness, tenderness               Rectovaginal: Confirms               Anus:  normal sphincter tone, no lesions  Chaperone was present for exam.  A:  Well Woman with normal exam  Osteoporosis, she will f/u with her primary, had her last DEXA there  Mixed urinary incontinence,  tolerable  Resolving vulvar boil   P:   Pap with hpv, if normal will space out her pap's  Discussed pap guidlines  Mammogram yearly  She will discuss her osteoporosis with her primary, I talked to her about reconsidering  treatment  Colonoscopy  Discussed breast self exam  Discussed calcium and vit D intake  Labs and immunizations with primary  Warm compresses and hot soaks for the boil, can try a sitz bath

## 2017-09-18 ENCOUNTER — Other Ambulatory Visit (HOSPITAL_COMMUNITY)
Admission: RE | Admit: 2017-09-18 | Discharge: 2017-09-18 | Disposition: A | Discharge status: Home / Self Care | Payer: PPO | Source: Ambulatory Visit | Attending: Obstetrics and Gynecology | Admitting: Obstetrics and Gynecology

## 2017-09-18 DIAGNOSIS — L814 Other melanin hyperpigmentation: Secondary | ICD-10-CM | POA: Diagnosis not present

## 2017-09-18 DIAGNOSIS — Z124 Encounter for screening for malignant neoplasm of cervix: Secondary | ICD-10-CM | POA: Insufficient documentation

## 2017-09-18 DIAGNOSIS — D2272 Melanocytic nevi of left lower limb, including hip: Secondary | ICD-10-CM | POA: Diagnosis not present

## 2017-09-18 DIAGNOSIS — D2271 Melanocytic nevi of right lower limb, including hip: Secondary | ICD-10-CM | POA: Diagnosis not present

## 2017-09-18 DIAGNOSIS — D1801 Hemangioma of skin and subcutaneous tissue: Secondary | ICD-10-CM | POA: Diagnosis not present

## 2017-09-18 DIAGNOSIS — Z85828 Personal history of other malignant neoplasm of skin: Secondary | ICD-10-CM | POA: Diagnosis not present

## 2017-09-18 DIAGNOSIS — D692 Other nonthrombocytopenic purpura: Secondary | ICD-10-CM | POA: Diagnosis not present

## 2017-09-18 DIAGNOSIS — L570 Actinic keratosis: Secondary | ICD-10-CM | POA: Diagnosis not present

## 2017-09-18 DIAGNOSIS — L438 Other lichen planus: Secondary | ICD-10-CM | POA: Diagnosis not present

## 2017-09-18 DIAGNOSIS — L905 Scar conditions and fibrosis of skin: Secondary | ICD-10-CM | POA: Diagnosis not present

## 2017-09-18 DIAGNOSIS — L821 Other seborrheic keratosis: Secondary | ICD-10-CM | POA: Diagnosis not present

## 2017-09-18 NOTE — Addendum Note (Signed)
Addended by: Dorothy Spark on: 09/18/2017 08:12 AM   Modules accepted: Orders

## 2017-09-19 LAB — CYTOLOGY - PAP
Diagnosis: NEGATIVE
HPV: NOT DETECTED

## 2017-10-13 DIAGNOSIS — N390 Urinary tract infection, site not specified: Secondary | ICD-10-CM | POA: Diagnosis not present

## 2017-10-14 ENCOUNTER — Encounter: Payer: Self-pay | Admitting: Obstetrics and Gynecology

## 2017-10-14 DIAGNOSIS — H6123 Impacted cerumen, bilateral: Secondary | ICD-10-CM | POA: Diagnosis not present

## 2017-10-14 DIAGNOSIS — R05 Cough: Secondary | ICD-10-CM | POA: Diagnosis not present

## 2017-10-14 DIAGNOSIS — Z6827 Body mass index (BMI) 27.0-27.9, adult: Secondary | ICD-10-CM | POA: Diagnosis not present

## 2017-10-14 DIAGNOSIS — N390 Urinary tract infection, site not specified: Secondary | ICD-10-CM | POA: Diagnosis not present

## 2017-10-14 DIAGNOSIS — J069 Acute upper respiratory infection, unspecified: Secondary | ICD-10-CM | POA: Diagnosis not present

## 2017-11-12 DIAGNOSIS — E7800 Pure hypercholesterolemia, unspecified: Secondary | ICD-10-CM | POA: Diagnosis not present

## 2017-11-12 DIAGNOSIS — E559 Vitamin D deficiency, unspecified: Secondary | ICD-10-CM | POA: Diagnosis not present

## 2017-11-12 DIAGNOSIS — R82998 Other abnormal findings in urine: Secondary | ICD-10-CM | POA: Diagnosis not present

## 2017-11-19 DIAGNOSIS — D692 Other nonthrombocytopenic purpura: Secondary | ICD-10-CM | POA: Diagnosis not present

## 2017-11-19 DIAGNOSIS — Z6827 Body mass index (BMI) 27.0-27.9, adult: Secondary | ICD-10-CM | POA: Diagnosis not present

## 2017-11-19 DIAGNOSIS — E7800 Pure hypercholesterolemia, unspecified: Secondary | ICD-10-CM | POA: Diagnosis not present

## 2017-11-19 DIAGNOSIS — Z0000 Encounter for general adult medical examination without abnormal findings: Secondary | ICD-10-CM | POA: Diagnosis not present

## 2017-11-19 DIAGNOSIS — I872 Venous insufficiency (chronic) (peripheral): Secondary | ICD-10-CM | POA: Diagnosis not present

## 2017-11-19 DIAGNOSIS — Z1389 Encounter for screening for other disorder: Secondary | ICD-10-CM | POA: Diagnosis not present

## 2017-11-19 DIAGNOSIS — E559 Vitamin D deficiency, unspecified: Secondary | ICD-10-CM | POA: Diagnosis not present

## 2017-11-19 DIAGNOSIS — N3946 Mixed incontinence: Secondary | ICD-10-CM | POA: Diagnosis not present

## 2017-11-19 DIAGNOSIS — D329 Benign neoplasm of meninges, unspecified: Secondary | ICD-10-CM | POA: Diagnosis not present

## 2017-11-19 DIAGNOSIS — D126 Benign neoplasm of colon, unspecified: Secondary | ICD-10-CM | POA: Diagnosis not present

## 2017-11-19 DIAGNOSIS — M810 Age-related osteoporosis without current pathological fracture: Secondary | ICD-10-CM | POA: Diagnosis not present

## 2017-11-19 DIAGNOSIS — K222 Esophageal obstruction: Secondary | ICD-10-CM | POA: Diagnosis not present

## 2017-11-21 DIAGNOSIS — Z1212 Encounter for screening for malignant neoplasm of rectum: Secondary | ICD-10-CM | POA: Diagnosis not present

## 2017-12-07 ENCOUNTER — Other Ambulatory Visit: Payer: Self-pay | Admitting: Gastroenterology

## 2018-02-11 DIAGNOSIS — Z1231 Encounter for screening mammogram for malignant neoplasm of breast: Secondary | ICD-10-CM | POA: Diagnosis not present

## 2018-02-15 ENCOUNTER — Emergency Department (HOSPITAL_COMMUNITY): Payer: PPO

## 2018-02-15 ENCOUNTER — Encounter (HOSPITAL_COMMUNITY): Payer: Self-pay | Admitting: *Deleted

## 2018-02-15 ENCOUNTER — Other Ambulatory Visit: Payer: Self-pay

## 2018-02-15 ENCOUNTER — Emergency Department (HOSPITAL_COMMUNITY)
Admission: EM | Admit: 2018-02-15 | Discharge: 2018-02-15 | Disposition: A | Discharge status: Home / Self Care | Payer: PPO | Attending: Emergency Medicine | Admitting: Emergency Medicine

## 2018-02-15 DIAGNOSIS — G834 Cauda equina syndrome: Secondary | ICD-10-CM | POA: Insufficient documentation

## 2018-02-15 DIAGNOSIS — N390 Urinary tract infection, site not specified: Secondary | ICD-10-CM | POA: Insufficient documentation

## 2018-02-15 DIAGNOSIS — Z79899 Other long term (current) drug therapy: Secondary | ICD-10-CM | POA: Diagnosis not present

## 2018-02-15 DIAGNOSIS — M4185 Other forms of scoliosis, thoracolumbar region: Secondary | ICD-10-CM | POA: Diagnosis not present

## 2018-02-15 DIAGNOSIS — N200 Calculus of kidney: Secondary | ICD-10-CM | POA: Diagnosis not present

## 2018-02-15 DIAGNOSIS — M5136 Other intervertebral disc degeneration, lumbar region: Secondary | ICD-10-CM | POA: Insufficient documentation

## 2018-02-15 DIAGNOSIS — J449 Chronic obstructive pulmonary disease, unspecified: Secondary | ICD-10-CM | POA: Insufficient documentation

## 2018-02-15 DIAGNOSIS — R339 Retention of urine, unspecified: Secondary | ICD-10-CM

## 2018-02-15 DIAGNOSIS — Z87891 Personal history of nicotine dependence: Secondary | ICD-10-CM | POA: Diagnosis not present

## 2018-02-15 DIAGNOSIS — M47815 Spondylosis without myelopathy or radiculopathy, thoracolumbar region: Secondary | ICD-10-CM

## 2018-02-15 DIAGNOSIS — M47895 Other spondylosis, thoracolumbar region: Secondary | ICD-10-CM | POA: Diagnosis not present

## 2018-02-15 DIAGNOSIS — M545 Low back pain: Secondary | ICD-10-CM | POA: Diagnosis not present

## 2018-02-15 DIAGNOSIS — R109 Unspecified abdominal pain: Secondary | ICD-10-CM | POA: Diagnosis present

## 2018-02-15 LAB — URINALYSIS, ROUTINE W REFLEX MICROSCOPIC
Bilirubin Urine: NEGATIVE
Glucose, UA: NEGATIVE mg/dL
Ketones, ur: NEGATIVE mg/dL
Nitrite: POSITIVE — AB
Protein, ur: 30 mg/dL — AB
Specific Gravity, Urine: 1.005 (ref 1.005–1.030)
WBC, UA: >50 WBC/hpf — ABNORMAL HIGH (ref 0–5)
pH: 6 (ref 5.0–8.0)

## 2018-02-15 MED ORDER — ONDANSETRON HCL 4 MG/2ML IJ SOLN
4.0000 mg | Freq: Once | INTRAMUSCULAR | Status: AC
  Administered 2018-02-15: 4 mg via INTRAVENOUS
  Filled 2018-02-15: qty 2

## 2018-02-15 MED ORDER — SODIUM CHLORIDE 0.9 % IV SOLN
INTRAVENOUS | Status: DC
  Administered 2018-02-15: 16:00:00 via INTRAVENOUS

## 2018-02-15 MED ORDER — FENTANYL CITRATE (PF) 100 MCG/2ML IJ SOLN
50.0000 ug | Freq: Once | INTRAMUSCULAR | Status: AC
  Administered 2018-02-15: 50 ug via INTRAVENOUS
  Filled 2018-02-15: qty 2

## 2018-02-15 NOTE — ED Notes (Signed)
Patient transported to CT 

## 2018-02-15 NOTE — ED Notes (Signed)
Called MRI to check on PT status

## 2018-02-15 NOTE — Discharge Instructions (Addendum)
Keep the Foley catheter in place until seen by urology.  Call them on Monday for follow-up.  This would be for the urinary retention.  Also I would make an appointment follow-up with your regular doctor.  To make sure that the urinary tract infection is appropriately being treated by the antibiotics and that it gets better.  The MRI of the thoracic back and lumbar back had no acute findings.  And no explanation for the urinary retention.  Continue the current antibiotic that you are on for the urinary tract infection.

## 2018-02-15 NOTE — ED Provider Notes (Signed)
Patient's MRI of thoracic and lumbar spine without an explanation for the urinary retention.  Patient is on an antibiotic already started yesterday for the urinary tract infection.  Cultures pending.  Patient will need a leg bag and follow-up with urology she is been followed by [facility] in the past.  She will call tomorrow for appointment.  Also recommend that she follow back up with her primary care doctor for follow-up of the urinary tract infection but urology may be to do that as well.   Fredia Sorrow, MD 02/15/18 2237

## 2018-02-15 NOTE — ED Provider Notes (Signed)
[facility] EMERGENCY DEPARTMENT Provider Note   CSN: [id number] Arrival date & time: 02/15/18  1437     History   Chief Complaint Chief Complaint  Patient presents with  . Groin Pain    HPI Ashley Rivera is a 77 y.o. female.  HPI   She reports onset of intermittent appendectomy pain in the suprapubic region starting yesterday.  The pain is severe and relapses spontaneously.  She also has associated dysuria, urinary frequency and hesitancy.  She denies hematuria.  Yesterday, her PCP called in a prescription for Septra DS which she is taking.  She denies fever, chills, weakness or dizziness.  No prior history of kidney stone disease.  She came here by private vehicle for evaluation.  There are no other known modifying factors.  Past Medical History:  Diagnosis Date  . Allergy   . Asthma    bronchial asthma  . Cataract    left eye  . COPD (chronic obstructive pulmonary disease) ([place])   . Diverticulosis   . Emphysema of lung ([place])   . Genital warts    Dx about 30 years ago   . GERD (gastroesophageal reflux disease)   . Hemorrhoids   . Hx of colonic polyp   . Hx: UTI (urinary tract infection)   . Osteoporosis   . Proctitis 11-12-11  . Urinary incontinence    On occasion    Patient Active Problem List   Diagnosis Date Noted  . GERD (gastroesophageal reflux disease) 01/17/2015  . Pruritus ani 12/04/2011  . Proctitis 12/04/2011  . Personal history of colonic polyps 10/22/2011  . Change in bowel habits 10/22/2011  . Internal hemorrhoids without mention of complication 89/37/3428    Past Surgical History:  Procedure Laterality Date  . COLONOSCOPY    . DILATION AND CURETTAGE OF UTERUS     50 years ago. Removed rest of placenta from birth  . INNER EAR SURGERY Right 2006   Tumor removed   . TONSILLECTOMY AND ADENOIDECTOMY  1950     OB History    Gravida  2   Para  2   Term  2   Preterm      AB      Living  2     SAB      TAB      Ectopic      Multiple      Live Births  2            Home Medications    Prior to Admission medications   Medication Sig Start Date End Date Taking? Authorizing Provider  sulfamethoxazole-trimethoprim (BACTRIM DS,SEPTRA DS) 800-160 MG tablet Take 1 tablet by mouth 2 (two) times daily. 02/14/18  Yes [provider]  Biotin 5000 MCG CAPS Take by mouth daily.    [provider]  Budesonide-Formoterol Fumarate (SYMBICORT IN) Inhale 2 puffs into the lungs daily as needed (shortness of breath).     [provider]  CALCIUM PO Take 1 capsule by mouth daily.     [provider]  cetirizine (ZYRTEC) 10 MG tablet Take 10 mg by mouth as needed.     [provider]  Cyanocobalamin (VITAMIN B-12 PO) Take 1 tablet by mouth daily.    [provider]  fish oil-omega-3 fatty acids 1000 MG capsule Take by mouth 2 (two) times daily.     [provider]  pantoprazole (PROTONIX) 40 MG tablet Take 1 tablet (40 mg total) by mouth daily.  03/20/16   Mauri Pole, MD  Probiotic Product (PROBIOTIC-10 PO) Take by mouth.    [provider]  ranitidine (ZANTAC) 300 MG tablet Take 1 tablet (300 mg total) by mouth at bedtime. 06/06/17   Mauri Pole, MD  sulfaSALAzine (AZULFIDINE) 500 MG EC tablet Take 2 tablets (1,000 mg total) by mouth 2 (two) times daily. 06/13/17   Mauri Pole, MD  sulfaSALAzine (AZULFIDINE) 500 MG EC tablet TAKE 3 TABLETS(1500 MG) BY MOUTH TWICE DAILY 12/08/17   Mauri Pole, MD  tiotropium (SPIRIVA) 18 MCG inhalation capsule Place 18 mcg into inhaler and inhale daily.    [provider]  VENTOLIN HFA 108 (90 BASE) MCG/ACT inhaler Reported on 05/11/2015 11/04/14   [provider]  VITAMIN D, CHOLECALCIFEROL, PO Take 2,000 mg by mouth 2 (two) times daily.     [provider]    Family History Family History  Problem Relation Age of Onset  . Lung cancer Mother   . Colon  cancer Paternal Grandmother   . Breast cancer Maternal Aunt   . Esophageal cancer Neg Hx   . Rectal cancer Neg Hx   . Stomach cancer Neg Hx     Social History Social History   Tobacco Use  . Smoking status: Former Smoker    Types: Cigarettes  . Smokeless tobacco: Never Used  Substance Use Topics  . Alcohol use: Yes    Alcohol/week: 2.0 standard drinks    Types: 2 Glasses of wine per week  . Drug use: No     Allergies   Patient has no known allergies.   Review of Systems Review of Systems  All other systems reviewed and are negative.    Physical Exam Updated Vital Signs BP (!) 145/57   Pulse 96   Temp 97.7 F (36.5 C) (Oral)   Resp 15   Ht 5' 2"  (1.575 m)   Wt 65.8 kg   SpO2 (!) 88%   BMI 26.52 kg/m   Physical Exam  Constitutional: She is oriented to person, place, and time. She appears well-developed and well-nourished.  HENT:  Head: Normocephalic and atraumatic.  Eyes: Pupils are equal, round, and reactive to light. Conjunctivae and EOM are normal.  Neck: Normal range of motion and phonation normal. Neck supple.  Cardiovascular: Normal rate and regular rhythm.  Pulmonary/Chest: Effort normal and breath sounds normal. She exhibits no tenderness.  Abdominal: Soft. She exhibits no distension. There is tenderness (Suprapubic, moderate). There is no rebound and no guarding.  Genitourinary:  Genitourinary Comments: No costovertebral angle tenderness.  Musculoskeletal: Normal range of motion.  Neurological: She is alert and oriented to person, place, and time. She exhibits normal muscle tone.  Skin: Skin is warm and dry.  Psychiatric: She has a normal mood and affect. Her behavior is normal. Judgment and thought content normal.  Nursing note and vitals reviewed.    ED Treatments / Results  Labs (all labs ordered are listed, but only abnormal results are displayed) Labs Reviewed  URINALYSIS, ROUTINE W REFLEX MICROSCOPIC - Abnormal; Notable for the  following components:      Result Value   Color, Urine AMBER (*)    APPearance HAZY (*)    Hgb urine dipstick LARGE (*)    Protein, ur 30 (*)    Nitrite POSITIVE (*)    Leukocytes, UA LARGE (*)    WBC, UA >50 (*)    Bacteria, UA RARE (*)    All other components within normal  limits  URINE CULTURE    EKG None  Radiology Ct Renal Stone Study  Result Date: 02/15/2018 CLINICAL DATA:  Flank pain, onset yesterday, severe intermittent LEFT groin pain. EXAM: CT ABDOMEN AND PELVIS WITHOUT CONTRAST TECHNIQUE: Multidetector CT imaging of the abdomen and pelvis was performed following the standard protocol without IV contrast. COMPARISON:  CT abdomen dated 07/29/2012. FINDINGS: Lower chest: No acute abnormality.  Hiatal hernia, moderate size. Hepatobiliary: Small cyst within the LEFT liver lobe. Liver otherwise unremarkable. Gallbladder appears normal. No bile duct dilatation. Pancreas: Unremarkable. No pancreatic ductal dilatation or surrounding inflammatory changes. Spleen: Normal in size without focal abnormality. Adrenals/Urinary Tract: Adrenal glands appear normal. 12 mm and 9 mm RIGHT renal stones. No LEFT renal stone. Stable small hemorrhagic cyst exophytic to the lower pole of the LEFT kidney. No suspicious mass within either kidney. No ureteral or bladder calculi identified. Bladder appears normal. Stomach/Bowel: No dilated large or small bowel loops. Extensive diverticulosis, extending throughout the colon but most severe within the descending colon, but no focal inflammatory change to suggest acute diverticulitis. No evidence of bowel wall inflammation. Appendix is normal. Vascular/Lymphatic: Aortic atherosclerosis. No enlarged abdominal or pelvic lymph nodes. Reproductive: Uterus and bilateral adnexa are unremarkable. Other: No free fluid or abscess collection. No free intraperitoneal air. Musculoskeletal: No acute or suspicious osseous finding. Degenerative spondylitic changes throughout the  scoliotic thoracolumbar spine, moderate in degree. IMPRESSION: 1. No acute findings. No convincing source for LEFT flank or LEFT groin pain. 2. RIGHT nephrolithiasis.  No ureteral or bladder calculi. 3. Extensive colonic diverticulosis but no evidence of acute diverticulitis. No bowel obstruction or evidence of bowel wall inflammation. 4. Degenerative changes throughout the scoliotic thoracolumbar spine, at least moderate in degree. If symptoms could be radiculopathic in nature, would consider nonemergent lumbar spine MRI to exclude associated nerve root impingement. No acute appearing osseous abnormality. 5. Hiatal hernia, at least moderate in size, incompletely imaged. 6.  Aortic Atherosclerosis (ICD10-I70.0). 7. Additional chronic/incidental findings detailed above. Electronically Signed   By: Franki Cabot M.D.   On: 02/15/2018 15:39    Procedures .Critical Care Performed by: Daleen Bo, MD Authorized by: Daleen Bo, MD   Critical care provider statement:    Critical care time (minutes):  35   Critical care start time:  02/15/2018 2:50 PM   Critical care end time:  02/15/2018 4:33 PM   Critical care time was exclusive of:  Separately billable procedures and treating other patients   Critical care was necessary to treat or prevent imminent or life-threatening deterioration of the following conditions:  CNS failure or compromise   Critical care was time spent personally by me on the following activities:  Blood draw for specimens, development of treatment plan with patient or surrogate, discussions with consultants, evaluation of patient's response to treatment, examination of patient, obtaining history from patient or surrogate, ordering and performing treatments and interventions, ordering and review of laboratory studies, pulse oximetry, re-evaluation of patient's condition, review of old charts and ordering and review of radiographic studies   (including critical care  time)  Medications Ordered in ED Medications  0.9 %  sodium chloride infusion ( Intravenous New Bag/Given 02/15/18 1539)  fentaNYL (SUBLIMAZE) injection 50 mcg (50 mcg Intravenous Given 02/15/18 1542)  ondansetron (ZOFRAN) injection 4 mg (4 mg Intravenous Given 02/15/18 1541)     Initial Impression / Assessment and Plan / ED Course  I have reviewed the triage vital signs and the nursing notes.  Pertinent labs & imaging results  that were available during my care of the patient were reviewed by me and considered in my medical decision making (see chart for details).  Clinical Course as of Feb 16 1632  Sun Feb 15, 2018  1556 Bladder scan with retention, greater than 700 cc urine in the urinary bladder.   [EW]  1556 Consistent with UTI  Urinalysis, Routine w reflex microscopic- may I&O cath if menses(!) [EW]  1557 No evident obstructing urolithiasis.  Right renal stones, nonobstructing.  Moderate degenerative changes in the lower thoracic and lumbar spines.  Hiatal hernia is present.  Aortic atherosclerosis without aneurysm or dissection.  CT Renal Laren Everts [EW]    Clinical Course User Index [EW] Daleen Bo, MD     Patient Vitals for the past 24 hrs:  BP Temp Temp src Pulse Resp SpO2 Height Weight  02/15/18 1545 (!) 145/57 - - 96 15 (!) 88 % - -  02/15/18 1444 (!) 174/79 97.7 F (36.5 C) Oral (!) 106 16 95 % 5' 2"  (1.575 m) 65.8 kg    4:31 PM Reevaluation with update and discussion. After initial assessment and treatment, an updated evaluation reveals Foley catheter placed by nursing with 500 cc urine output and relief of pain from the patient's report.  Patient updated on findings and plan to get MRI, she is agreeable.Daleen Bo   Medical Decision Making: Suprapubic pressure with urinary retention and UTI.  Significant thoracolumbar scoliosis and degenerative changes concerning for source of cauda equina syndrome which appears to be causing urinary retention.  It is  possible that her UTI is contributing to the retention however this cannot be ruled out without advanced imaging.  CRITICAL CARE-yes Performed by: Daleen Bo  Nursing Notes Reviewed/ Care Coordinated Applicable Imaging Reviewed Interpretation of Laboratory Data incorporated into ED treatment   Plan-disposition as per Dr. Rogene Houston after MRI imaging to rule out spinal myelopathy causing urinary retention.  Final Clinical Impressions(s) / ED Diagnoses   Final diagnoses:  Urinary retention  Urinary tract infection without hematuria, site unspecified  Cauda equina syndrome ([place])  Osteoarthritis of thoracolumbar spine, unspecified spinal osteoarthritis complication status    ED Discharge Orders    None       Daleen Bo, MD 02/15/18 [phone]

## 2018-02-15 NOTE — ED Notes (Signed)
Bladder scanned 786m urine post void

## 2018-02-15 NOTE — ED Triage Notes (Signed)
Pt reports onset yesterday of severe, intermittent left groin pain. Reports having to strain to urinate, no hx of kidney stones.

## 2018-02-17 LAB — URINE CULTURE: Culture: >=100000 — AB

## 2018-02-18 ENCOUNTER — Other Ambulatory Visit: Payer: Self-pay | Admitting: Gastroenterology

## 2018-02-18 ENCOUNTER — Telehealth: Payer: Self-pay | Admitting: *Deleted

## 2018-02-18 DIAGNOSIS — N200 Calculus of kidney: Secondary | ICD-10-CM | POA: Diagnosis not present

## 2018-02-18 DIAGNOSIS — R330 Drug induced retention of urine: Secondary | ICD-10-CM | POA: Diagnosis not present

## 2018-02-18 DIAGNOSIS — N3000 Acute cystitis without hematuria: Secondary | ICD-10-CM | POA: Diagnosis not present

## 2018-02-18 NOTE — Telephone Encounter (Signed)
Post ED Visit - Positive Culture Follow-up: Successful Patient Follow-Up  Culture assessed and recommendations reviewed by:  []  Elenor Quinones, Pharm.D. []  Heide Guile, Pharm.D., BCPS AQ-ID []  Parks Neptune, Pharm.D., BCPS []  Alycia Rossetti, Pharm.D., BCPS []  [place], Pharm.D., BCPS, AAHIVP []  Legrand Como, Pharm.D., BCPS, AAHIVP []  Salome Arnt, PharmD, BCPS []  Johnnette Gourd, PharmD, BCPS []  Hughes Better, PharmD, BCPS []  Leeroy Cha, PharmD Elicia Lamp, PharmD  Positive urine culture  []  Patient discharged without antimicrobial prescription and treatment is now indicated [x]  Organism is resistant to prescribed ED discharge antimicrobial []  Patient with positive blood cultures  Changes discussed with ED provider Suella Broad, PA-C who recommends Nitrofurantoin 170m PO BID x 5 days.  Per pt she has been seen in FU with Urology who have also reviewed culture results and treated appropriately.  Contacted patient, date 02/18/2018, time [place]10/16/2019, 10:51 AM

## 2018-02-18 NOTE — Progress Notes (Signed)
ED Antimicrobial Stewardship Positive Culture Follow Up   Ashley Rivera is an 77 y.o. female who presented to [facility] on 02/15/2018 with a chief complaint of  Chief Complaint  Patient presents with  . Groin Pain    Recent Results (from the past 720 hour(s))  Urine culture     Status: Abnormal   Collection Time: 02/15/18  4:24 PM  Result Value Ref Range Status   Specimen Description URINE, CLEAN CATCH  Final   Special Requests   Final    NONE Performed at [facility], [place] [address] [place], [place] [address]    Culture >=100,000 COLONIES/mL CITROBACTER FREUNDII (A)  Final   Report Status 02/17/2018 FINAL  Final   Organism ID, Bacteria CITROBACTER FREUNDII (A)  Final      Susceptibility   Citrobacter freundii - MIC*    CEFAZOLIN >=64 RESISTANT Resistant     CEFTRIAXONE <=1 SENSITIVE Sensitive     CIPROFLOXACIN <=0.25 SENSITIVE Sensitive     GENTAMICIN <=1 SENSITIVE Sensitive     IMIPENEM <=0.25 SENSITIVE Sensitive     NITROFURANTOIN <=16 SENSITIVE Sensitive     TRIMETH/SULFA >=320 RESISTANT Resistant     PIP/TAZO <=4 SENSITIVE Sensitive     * >=100,000 COLONIES/mL CITROBACTER FREUNDII    [x]  Treated with Bactrim, organism resistant to prescribed antimicrobial  New antibiotic prescription: Stop Bactrim. Start Nitrofurantoin 169m PO BID x 5 days.  ED Provider: LSuella Broad PA-C   BElicia LampP 02/18/2018, 10:19 AM Clinical Pharmacist Monday - Friday phone -  [phone] - Sunday phone - [phone]

## 2018-03-18 DIAGNOSIS — N130 Hydronephrosis with ureteropelvic junction obstruction: Secondary | ICD-10-CM | POA: Diagnosis not present

## 2018-03-18 DIAGNOSIS — R339 Retention of urine, unspecified: Secondary | ICD-10-CM | POA: Diagnosis not present

## 2018-03-18 DIAGNOSIS — N200 Calculus of kidney: Secondary | ICD-10-CM | POA: Diagnosis not present

## 2018-03-18 DIAGNOSIS — R3915 Urgency of urination: Secondary | ICD-10-CM | POA: Diagnosis not present

## 2018-04-17 ENCOUNTER — Other Ambulatory Visit: Payer: Self-pay | Admitting: Gastroenterology

## 2018-06-05 ENCOUNTER — Other Ambulatory Visit: Payer: Self-pay | Admitting: Neurosurgery

## 2018-06-05 DIAGNOSIS — D329 Benign neoplasm of meninges, unspecified: Secondary | ICD-10-CM

## 2018-06-15 ENCOUNTER — Ambulatory Visit
Admission: RE | Admit: 2018-06-15 | Discharge: 2018-06-15 | Disposition: A | Discharge status: Home / Self Care | Payer: PPO | Source: Ambulatory Visit | Attending: Neurosurgery | Admitting: Neurosurgery

## 2018-06-15 DIAGNOSIS — D320 Benign neoplasm of cerebral meninges: Secondary | ICD-10-CM | POA: Diagnosis not present

## 2018-06-15 DIAGNOSIS — D329 Benign neoplasm of meninges, unspecified: Secondary | ICD-10-CM

## 2018-06-15 MED ORDER — GADOBENATE DIMEGLUMINE 529 MG/ML IV SOLN
13.0000 mL | Freq: Once | INTRAVENOUS | Status: AC | PRN
  Administered 2018-06-15: 13 mL via INTRAVENOUS

## 2018-06-18 ENCOUNTER — Other Ambulatory Visit: Payer: Self-pay | Admitting: Gastroenterology

## 2018-06-22 DIAGNOSIS — D329 Benign neoplasm of meninges, unspecified: Secondary | ICD-10-CM | POA: Diagnosis not present

## 2018-07-06 DIAGNOSIS — H04122 Dry eye syndrome of left lacrimal gland: Secondary | ICD-10-CM | POA: Diagnosis not present

## 2018-07-16 DIAGNOSIS — H26492 Other secondary cataract, left eye: Secondary | ICD-10-CM | POA: Diagnosis not present

## 2018-07-21 DIAGNOSIS — H268 Other specified cataract: Secondary | ICD-10-CM | POA: Diagnosis not present

## 2018-07-21 DIAGNOSIS — H21561 Pupillary abnormality, right eye: Secondary | ICD-10-CM | POA: Diagnosis not present

## 2018-07-21 DIAGNOSIS — H25811 Combined forms of age-related cataract, right eye: Secondary | ICD-10-CM | POA: Diagnosis not present

## 2018-08-11 ENCOUNTER — Other Ambulatory Visit: Payer: Self-pay | Admitting: Gastroenterology

## 2018-10-09 ENCOUNTER — Other Ambulatory Visit: Payer: Self-pay | Admitting: Gastroenterology

## 2018-11-13 DIAGNOSIS — E7800 Pure hypercholesterolemia, unspecified: Secondary | ICD-10-CM | POA: Diagnosis not present

## 2018-11-13 DIAGNOSIS — E559 Vitamin D deficiency, unspecified: Secondary | ICD-10-CM | POA: Diagnosis not present

## 2018-11-17 DIAGNOSIS — R82998 Other abnormal findings in urine: Secondary | ICD-10-CM | POA: Diagnosis not present

## 2018-11-23 DIAGNOSIS — I872 Venous insufficiency (chronic) (peripheral): Secondary | ICD-10-CM | POA: Diagnosis not present

## 2018-11-23 DIAGNOSIS — Z87891 Personal history of nicotine dependence: Secondary | ICD-10-CM | POA: Diagnosis not present

## 2018-11-23 DIAGNOSIS — D126 Benign neoplasm of colon, unspecified: Secondary | ICD-10-CM | POA: Diagnosis not present

## 2018-11-23 DIAGNOSIS — D329 Benign neoplasm of meninges, unspecified: Secondary | ICD-10-CM | POA: Diagnosis not present

## 2018-11-23 DIAGNOSIS — J449 Chronic obstructive pulmonary disease, unspecified: Secondary | ICD-10-CM | POA: Diagnosis not present

## 2018-11-23 DIAGNOSIS — E7800 Pure hypercholesterolemia, unspecified: Secondary | ICD-10-CM | POA: Diagnosis not present

## 2018-11-23 DIAGNOSIS — K222 Esophageal obstruction: Secondary | ICD-10-CM | POA: Diagnosis not present

## 2018-11-23 DIAGNOSIS — N3946 Mixed incontinence: Secondary | ICD-10-CM | POA: Diagnosis not present

## 2018-11-23 DIAGNOSIS — Z1331 Encounter for screening for depression: Secondary | ICD-10-CM | POA: Diagnosis not present

## 2018-11-23 DIAGNOSIS — Z0000 Encounter for general adult medical examination without abnormal findings: Secondary | ICD-10-CM | POA: Diagnosis not present

## 2018-11-23 DIAGNOSIS — K219 Gastro-esophageal reflux disease without esophagitis: Secondary | ICD-10-CM | POA: Diagnosis not present

## 2018-11-23 DIAGNOSIS — E559 Vitamin D deficiency, unspecified: Secondary | ICD-10-CM | POA: Diagnosis not present

## 2018-11-23 DIAGNOSIS — M810 Age-related osteoporosis without current pathological fracture: Secondary | ICD-10-CM | POA: Diagnosis not present

## 2018-12-16 ENCOUNTER — Other Ambulatory Visit: Payer: Self-pay | Admitting: Gastroenterology

## 2019-01-01 ENCOUNTER — Ambulatory Visit (HOSPITAL_COMMUNITY)
Admission: EM | Admit: 2019-01-01 | Discharge: 2019-01-01 | Disposition: A | Discharge status: Home / Self Care | Payer: PPO | Attending: Emergency Medicine | Admitting: Emergency Medicine

## 2019-01-01 ENCOUNTER — Other Ambulatory Visit: Payer: Self-pay

## 2019-01-01 DIAGNOSIS — R6883 Chills (without fever): Secondary | ICD-10-CM | POA: Insufficient documentation

## 2019-01-01 DIAGNOSIS — J029 Acute pharyngitis, unspecified: Secondary | ICD-10-CM | POA: Diagnosis not present

## 2019-01-01 DIAGNOSIS — J449 Chronic obstructive pulmonary disease, unspecified: Secondary | ICD-10-CM | POA: Diagnosis not present

## 2019-01-01 DIAGNOSIS — Z79899 Other long term (current) drug therapy: Secondary | ICD-10-CM | POA: Diagnosis not present

## 2019-01-01 DIAGNOSIS — R5383 Other fatigue: Secondary | ICD-10-CM | POA: Insufficient documentation

## 2019-01-01 DIAGNOSIS — M791 Myalgia, unspecified site: Secondary | ICD-10-CM | POA: Diagnosis not present

## 2019-01-01 DIAGNOSIS — Z20828 Contact with and (suspected) exposure to other viral communicable diseases: Secondary | ICD-10-CM | POA: Insufficient documentation

## 2019-01-01 DIAGNOSIS — Z87891 Personal history of nicotine dependence: Secondary | ICD-10-CM | POA: Diagnosis not present

## 2019-01-01 DIAGNOSIS — K219 Gastro-esophageal reflux disease without esophagitis: Secondary | ICD-10-CM | POA: Diagnosis not present

## 2019-01-01 DIAGNOSIS — Z20822 Contact with and (suspected) exposure to covid-19: Secondary | ICD-10-CM

## 2019-01-01 LAB — POCT RAPID STREP A: Streptococcus, Group A Screen (Direct): NEGATIVE

## 2019-01-01 NOTE — Discharge Instructions (Addendum)
Rapid strep test was negative; culture is pending.  Your COVID test is pending.  You should self quarantine until your test result is back and is negative.    Go to the emergency department if you develop difficulty swallowing, shortness of breath, high fever, severe diarrhea, or other concerning symptoms.

## 2019-01-01 NOTE — ED Triage Notes (Signed)
t reports a fore throat that started on Wednesday.  Yesterday she started feeling some fatigue, body aches, andchills.  She denies fever, loss of taste or smell, N, V, or D.  Pt has COPD and would like to be tested for Covid 19.

## 2019-01-01 NOTE — ED Provider Notes (Signed)
[facility]    CSN: [id number] Arrival date & time: 01/01/19  7829      History   Chief Complaint Chief Complaint  Patient presents with  . Sore Throat  . Fatigue  . Chills    HPI Ashley Rivera is a 78 y.o. female.   Patient presents with sore throat, fatigue, body aches, chills x2 days.  She request COVID testing.  Her past medical history is significant for COPD.  She denies fever, cough, shortness of breath, abdominal pain, vomiting, diarrhea, or other symptoms.     The history is provided by the patient.    Past Medical History:  Diagnosis Date  . Allergy   . Asthma    bronchial asthma  . Cataract    left eye  . COPD (chronic obstructive pulmonary disease) ([place])   . Diverticulosis   . Emphysema of lung ([place])   . Genital warts    Dx about 30 years ago   . GERD (gastroesophageal reflux disease)   . Hemorrhoids   . Hx of colonic polyp   . Hx: UTI (urinary tract infection)   . Osteoporosis   . Proctitis 11-12-11  . Urinary incontinence    On occasion    Patient Active Problem List   Diagnosis Date Noted  . GERD (gastroesophageal reflux disease) 01/17/2015  . Pruritus ani 12/04/2011  . Proctitis 12/04/2011  . Personal history of colonic polyps 10/22/2011  . Change in bowel habits 10/22/2011  . Internal hemorrhoids without mention of complication 56/21/3086    Past Surgical History:  Procedure Laterality Date  . COLONOSCOPY    . DILATION AND CURETTAGE OF UTERUS     50 years ago. Removed rest of placenta from birth  . INNER EAR SURGERY Right 2006   Tumor removed   . TONSILLECTOMY AND ADENOIDECTOMY  1950    OB History    Gravida  2   Para  2   Term  2   Preterm      AB      Living  2     SAB      TAB      Ectopic      Multiple      Live Births  2            Home Medications    Prior to Admission medications   Medication Sig Start Date End Date Taking? Authorizing Provider  budesonide-formoterol (SYMBICORT)  160-4.5 MCG/ACT inhaler Inhale 2 puffs into the lungs daily as needed (shortness of breath).    Yes [provider]  carboxymethylcellulose (REFRESH PLUS) 0.5 % SOLN Place 2 drops into both eyes 2 (two) times daily as needed.   Yes [provider]  cetirizine (ZYRTEC) 10 MG tablet Take 10 mg by mouth as needed.    Yes [provider]  Cyanocobalamin (VITAMIN B-12 PO) Take 1 tablet by mouth daily.   Yes [provider]  fish oil-omega-3 fatty acids 1000 MG capsule Take by mouth 2 (two) times daily.    Yes [provider]  Melatonin 5 MG TABS Take 5 mg by mouth at bedtime as needed.   Yes [provider]  pantoprazole (PROTONIX) 40 MG tablet Take 1 tablet (40 mg total) by mouth daily. 03/20/16  Yes Nandigam, Venia Minks, MD  Probiotic Product (PROBIOTIC-10 PO) Take 1 tablet by mouth daily. Align   Yes [provider]  ranitidine (ZANTAC) 300 MG tablet Take 1 tablet (300 mg total)  by mouth at bedtime. 06/06/17  Yes Nandigam, Venia Minks, MD  sulfamethoxazole-trimethoprim (BACTRIM DS,SEPTRA DS) 800-160 MG tablet Take 1 tablet by mouth 2 (two) times daily. 02/14/18  Yes [provider]  sulfaSALAzine (AZULFIDINE) 500 MG EC tablet Take 2 tablets (1,000 mg total) by mouth 2 (two) times daily. Patient taking differently: Take 1,500 mg by mouth 2 (two) times daily.  06/13/17  Yes Nandigam, Venia Minks, MD  sulfaSALAzine (AZULFIDINE) 500 MG EC tablet TAKE 3 TABLETS(1500 MG) BY MOUTH TWICE DAILY 12/17/18  Yes Nandigam, Venia Minks, MD  tiotropium (SPIRIVA) 18 MCG inhalation capsule Place 18 mcg into inhaler and inhale daily.   Yes [provider]  VENTOLIN HFA 108 (90 BASE) MCG/ACT inhaler Inhale 1 puff into the lungs as needed for shortness of breath.  11/04/14  Yes [provider]  VITAMIN D, CHOLECALCIFEROL, PO Take 2,000 mg by mouth as needed.    Yes [provider]    Family History Family History  Problem Relation Age  of Onset  . Lung cancer Mother   . Colon cancer Paternal Grandmother   . Breast cancer Maternal Aunt   . Esophageal cancer Neg Hx   . Rectal cancer Neg Hx   . Stomach cancer Neg Hx     Social History Social History   Tobacco Use  . Smoking status: Former Smoker    Types: Cigarettes  . Smokeless tobacco: Never Used  Substance Use Topics  . Alcohol use: Yes    Alcohol/week: 2.0 standard drinks    Types: 2 Glasses of wine per week  . Drug use: No     Allergies   Patient has no known allergies.   Review of Systems Review of Systems  Constitutional: Positive for chills and fatigue. Negative for fever.  HENT: Positive for sore throat. Negative for congestion, ear pain and rhinorrhea.   Eyes: Negative for pain and visual disturbance.  Respiratory: Negative for cough and shortness of breath.   Cardiovascular: Negative for chest pain and palpitations.  Gastrointestinal: Negative for abdominal pain, diarrhea and vomiting.  Genitourinary: Negative for dysuria and hematuria.  Musculoskeletal: Negative for arthralgias and back pain.  Skin: Negative for color change and rash.  Neurological: Negative for seizures and syncope.  All other systems reviewed and are negative.    Physical Exam Triage Vital Signs ED Triage Vitals  Enc Vitals Group     BP 01/01/19 0846 105/81     Pulse Rate 01/01/19 0846 84     Resp 01/01/19 0846 18     Temp 01/01/19 0846 97.6 F (36.4 C)     Temp Source 01/01/19 0846 Oral     SpO2 01/01/19 0846 96 %     Weight --      Height --      Head Circumference --      Peak Flow --      Pain Score 01/01/19 0841 6     Pain Loc --      Pain Edu? --      Excl. in [place]? --    No data found.  Updated Vital Signs BP 105/81 (BP Location: Right Arm)   Pulse 84   Temp 97.6 F (36.4 C) (Oral)   Resp 18   SpO2 96%   Visual Acuity Right Eye Distance:   Left Eye Distance:   Bilateral Distance:    Right Eye Near:   Left Eye Near:    Bilateral Near:      Physical Exam  Vitals signs and nursing note reviewed.  Constitutional:      General: She is not in acute distress.    Appearance: She is well-developed.  HENT:     Head: Normocephalic and atraumatic.     Right Ear: Tympanic membrane normal.     Left Ear: Tympanic membrane normal.     Nose: Nose normal.     Mouth/Throat:     Mouth: Mucous membranes are moist.     Pharynx: Posterior oropharyngeal erythema present. No oropharyngeal exudate.  Eyes:     Conjunctiva/sclera: Conjunctivae normal.  Neck:     Musculoskeletal: Neck supple.  Cardiovascular:     Rate and Rhythm: Normal rate and regular rhythm.     Heart sounds: No murmur.  Pulmonary:     Effort: Pulmonary effort is normal. No respiratory distress.     Breath sounds: Normal breath sounds.  Abdominal:     Palpations: Abdomen is soft.     Tenderness: There is no abdominal tenderness. There is no guarding or rebound.  Skin:    General: Skin is warm and dry.     Findings: No rash.  Neurological:     Mental Status: She is alert.      UC Treatments / Results  Labs (all labs ordered are listed, but only abnormal results are displayed) Labs Reviewed  NOVEL CORONAVIRUS, NAA (HOSP ORDER, SEND-OUT TO REF LAB; TAT 18-24 HRS)  CULTURE, GROUP A STREP [facility])  POCT RAPID STREP A    EKG   Radiology No results found.  Procedures Procedures (including critical care time)  Medications Ordered in UC Medications - No data to display  Initial Impression / Assessment and Plan / UC Course  I have reviewed the triage vital signs and the nursing notes.  Pertinent labs & imaging results that were available during my care of the patient were reviewed by me and considered in my medical decision making (see chart for details).   Sore throat, suspected COVID.  Rapid strep negative; culture pending.  COVID test performed here.  Instructed patient to self quarantine until her cover test is back.  Instructed patient to go to the  emergency department if she develops difficulty swallowing, shortness of breath, high fever, severe diarrhea, or other concerning symptoms.  Patient agrees with treatment plan.     Final Clinical Impressions(s) / UC Diagnoses   Final diagnoses:  Sore throat  Suspected Covid-19 Virus Infection     Discharge Instructions     Rapid strep test was negative; culture is pending.  Your COVID test is pending.  You should self quarantine until your test result is back and is negative.    Go to the emergency department if you develop difficulty swallowing, shortness of breath, high fever, severe diarrhea, or other concerning symptoms.         ED Prescriptions    None     Controlled Substance Prescriptions [place] Controlled Substance Registry consulted? Not Applicable   Sharion Balloon, NP 01/01/19 [phone]

## 2019-01-02 LAB — NOVEL CORONAVIRUS, NAA (HOSP ORDER, SEND-OUT TO REF LAB; TAT 18-24 HRS): SARS-CoV-2, NAA: NOT DETECTED

## 2019-01-03 LAB — CULTURE, GROUP A STREP (THRC)

## 2019-01-04 ENCOUNTER — Encounter (HOSPITAL_COMMUNITY): Payer: Self-pay

## 2019-01-05 DIAGNOSIS — J029 Acute pharyngitis, unspecified: Secondary | ICD-10-CM | POA: Diagnosis not present

## 2019-01-05 DIAGNOSIS — R109 Unspecified abdominal pain: Secondary | ICD-10-CM | POA: Diagnosis not present

## 2019-01-05 DIAGNOSIS — R1030 Lower abdominal pain, unspecified: Secondary | ICD-10-CM | POA: Diagnosis not present

## 2019-02-06 DIAGNOSIS — Z23 Encounter for immunization: Secondary | ICD-10-CM | POA: Diagnosis not present

## 2019-02-17 DIAGNOSIS — Z803 Family history of malignant neoplasm of breast: Secondary | ICD-10-CM | POA: Diagnosis not present

## 2019-02-17 DIAGNOSIS — Z1231 Encounter for screening mammogram for malignant neoplasm of breast: Secondary | ICD-10-CM | POA: Diagnosis not present

## 2019-03-01 ENCOUNTER — Other Ambulatory Visit: Payer: Self-pay

## 2019-03-01 MED ORDER — SULFASALAZINE 500 MG PO TBEC: DELAYED_RELEASE_TABLET | ORAL | 0 refills | Status: DC

## 2019-05-03 ENCOUNTER — Other Ambulatory Visit: Payer: Self-pay

## 2019-05-03 MED ORDER — SULFAMETHOXAZOLE-TRIMETHOPRIM 800-160 MG PO TABS: 1.0000 | ORAL_TABLET | Freq: Two times a day (BID) | ORAL | 0 refills | Status: DC

## 2019-05-10 ENCOUNTER — Telehealth: Payer: Self-pay | Admitting: Gastroenterology

## 2019-05-10 MED ORDER — SULFAMETHOXAZOLE-TRIMETHOPRIM 800-160 MG PO TABS: 1.0000 | ORAL_TABLET | Freq: Two times a day (BID) | ORAL | 1 refills | Status: DC

## 2019-05-10 NOTE — Telephone Encounter (Signed)
Patient was sent in Bactrim by Vivien Rota on 12/28, that is what the refill request come through from the pharmacy, Vivien Rota spoke with patient and it is the sulfasalazine the patient needed, sent in script today with 1 refill for patient

## 2019-05-11 ENCOUNTER — Other Ambulatory Visit: Payer: Self-pay | Admitting: Gastroenterology

## 2019-05-11 ENCOUNTER — Other Ambulatory Visit: Payer: Self-pay

## 2019-05-11 MED ORDER — SULFASALAZINE 500 MG PO TBEC: DELAYED_RELEASE_TABLET | ORAL | 0 refills | Status: DC

## 2019-05-13 DIAGNOSIS — H04123 Dry eye syndrome of bilateral lacrimal glands: Secondary | ICD-10-CM | POA: Diagnosis not present

## 2019-05-13 DIAGNOSIS — H16232 Neurotrophic keratoconjunctivitis, left eye: Secondary | ICD-10-CM | POA: Diagnosis not present

## 2019-05-17 ENCOUNTER — Ambulatory Visit: Payer: Medicare Other | Attending: Internal Medicine

## 2019-05-17 DIAGNOSIS — Z23 Encounter for immunization: Secondary | ICD-10-CM

## 2019-05-17 NOTE — Progress Notes (Signed)
   Covid-19 Vaccination Clinic  Name:  Ashley Rivera    MRN: [id number] DOB: [birth year]  05/17/2019  [name] was observed post Covid-19 immunization for 15 minutes without incidence. She was provided with Vaccine Information Sheet and instruction to access the V-Safe system.   [name] was instructed to call 911 with any severe reactions post vaccine: Marland Kitchen Difficulty breathing  . Swelling of your face and throat  . A fast heartbeat  . A bad rash all over your body  . Dizziness and weakness    Immunizations Administered    Name Date Dose VIS Date Route   Pfizer COVID-19 Vaccine 05/17/2019  9:11 AM 0.3 mL 04/16/2019 Intramuscular   Manufacturer: [employer], [employer]   Lot: [id number]   [place]: [id number]

## 2019-05-21 DIAGNOSIS — L821 Other seborrheic keratosis: Secondary | ICD-10-CM | POA: Diagnosis not present

## 2019-05-21 DIAGNOSIS — L814 Other melanin hyperpigmentation: Secondary | ICD-10-CM | POA: Diagnosis not present

## 2019-05-21 DIAGNOSIS — D2271 Melanocytic nevi of right lower limb, including hip: Secondary | ICD-10-CM | POA: Diagnosis not present

## 2019-05-21 DIAGNOSIS — L400 Psoriasis vulgaris: Secondary | ICD-10-CM | POA: Diagnosis not present

## 2019-05-21 DIAGNOSIS — D225 Melanocytic nevi of trunk: Secondary | ICD-10-CM | POA: Diagnosis not present

## 2019-05-21 DIAGNOSIS — Z85828 Personal history of other malignant neoplasm of skin: Secondary | ICD-10-CM | POA: Diagnosis not present

## 2019-05-21 DIAGNOSIS — D239 Other benign neoplasm of skin, unspecified: Secondary | ICD-10-CM | POA: Diagnosis not present

## 2019-05-21 DIAGNOSIS — L570 Actinic keratosis: Secondary | ICD-10-CM | POA: Diagnosis not present

## 2019-05-21 DIAGNOSIS — L718 Other rosacea: Secondary | ICD-10-CM | POA: Diagnosis not present

## 2019-05-21 DIAGNOSIS — D485 Neoplasm of uncertain behavior of skin: Secondary | ICD-10-CM | POA: Diagnosis not present

## 2019-06-04 ENCOUNTER — Ambulatory Visit: Payer: PPO

## 2019-06-05 ENCOUNTER — Ambulatory Visit: Payer: PPO | Attending: Internal Medicine

## 2019-06-05 DIAGNOSIS — Z23 Encounter for immunization: Secondary | ICD-10-CM

## 2019-06-05 NOTE — Progress Notes (Signed)
   Covid-19 Vaccination Clinic  Name:  VENNESA BASTEDO    MRN: [id number] DOB: [birth year]  06/05/2019  Ms. Breit was observed post Covid-19 immunization for 15 minutes without incidence. She was provided with Vaccine Information Sheet and instruction to access the V-Safe system.   Ms. Matton was instructed to call 911 with any severe reactions post vaccine: Marland Kitchen Difficulty breathing  . Swelling of your face and throat  . A fast heartbeat  . A bad rash all over your body  . Dizziness and weakness    Immunizations Administered    Name Date Dose VIS Date Route   Pfizer COVID-19 Vaccine 06/05/2019  8:23 AM 0.3 mL 04/16/2019 Intramuscular   Manufacturer: [place]   Lot: [id number]   [place]: [id number]

## 2019-07-02 ENCOUNTER — Other Ambulatory Visit: Payer: Self-pay | Admitting: Gastroenterology

## 2019-07-05 ENCOUNTER — Telehealth: Payer: Self-pay | Admitting: Gastroenterology

## 2019-07-05 MED ORDER — SULFASALAZINE 500 MG PO TBEC: DELAYED_RELEASE_TABLET | ORAL | 0 refills | Status: DC

## 2019-07-05 NOTE — Telephone Encounter (Signed)
Pt requested a refill for sulfasalazine.

## 2019-07-05 NOTE — Telephone Encounter (Signed)
Patient needs an office appointment but will send in 1 refill  Scheduled pt for OV on 3/26  Pt aware

## 2019-07-29 DIAGNOSIS — L03116 Cellulitis of left lower limb: Secondary | ICD-10-CM | POA: Diagnosis not present

## 2019-07-29 DIAGNOSIS — Z1331 Encounter for screening for depression: Secondary | ICD-10-CM | POA: Diagnosis not present

## 2019-07-29 DIAGNOSIS — N390 Urinary tract infection, site not specified: Secondary | ICD-10-CM | POA: Diagnosis not present

## 2019-07-29 DIAGNOSIS — D692 Other nonthrombocytopenic purpura: Secondary | ICD-10-CM | POA: Diagnosis not present

## 2019-07-30 ENCOUNTER — Ambulatory Visit: Payer: PPO | Admitting: Gastroenterology

## 2019-07-30 ENCOUNTER — Encounter: Payer: Self-pay | Admitting: Gastroenterology

## 2019-07-30 ENCOUNTER — Other Ambulatory Visit (INDEPENDENT_AMBULATORY_CARE_PROVIDER_SITE_OTHER): Payer: PPO

## 2019-07-30 VITALS — BP 128/70 | HR 88 | Temp 98.6°F | Ht 61.0 in | Wt 147.1 lb

## 2019-07-30 DIAGNOSIS — K51219 Ulcerative (chronic) proctitis with unspecified complications: Secondary | ICD-10-CM

## 2019-07-30 DIAGNOSIS — R1084 Generalized abdominal pain: Secondary | ICD-10-CM

## 2019-07-30 DIAGNOSIS — K625 Hemorrhage of anus and rectum: Secondary | ICD-10-CM

## 2019-07-30 LAB — CBC WITH DIFFERENTIAL/PLATELET
Basophils Absolute: 0.1 10*3/uL (ref 0.0–0.1)
Basophils Relative: 1 % (ref 0.0–3.0)
Eosinophils Absolute: 0.2 10*3/uL (ref 0.0–0.7)
Eosinophils Relative: 3 % (ref 0.0–5.0)
HCT: 37.1 % (ref 36.0–46.0)
Hemoglobin: 12.4 g/dL (ref 12.0–15.0)
Lymphocytes Relative: 17.2 % (ref 12.0–46.0)
Lymphs Abs: 1 10*3/uL (ref 0.7–4.0)
MCHC: 33.5 g/dL (ref 30.0–36.0)
MCV: 88.7 fl (ref 78.0–100.0)
Monocytes Absolute: 0.7 10*3/uL (ref 0.1–1.0)
Monocytes Relative: 12.3 % — ABNORMAL HIGH (ref 3.0–12.0)
Neutro Abs: 4 10*3/uL (ref 1.4–7.7)
Neutrophils Relative %: 66.5 % (ref 43.0–77.0)
Platelets: 157 10*3/uL (ref 150.0–400.0)
RBC: 4.18 Mil/uL (ref 3.87–5.11)
RDW: 14.8 % (ref 11.5–15.5)
WBC: 6 10*3/uL (ref 4.0–10.5)

## 2019-07-30 LAB — FOLATE: Folate: 18.8 ng/mL (ref >5.9)

## 2019-07-30 LAB — COMPREHENSIVE METABOLIC PANEL
ALT: 17 U/L (ref 0–35)
AST: 20 U/L (ref 0–37)
Albumin: 4 g/dL (ref 3.5–5.2)
Alkaline Phosphatase: 55 U/L (ref 39–117)
BUN: 14 mg/dL (ref 6–23)
CO2: 30 mEq/L (ref 19–32)
Calcium: 9.6 mg/dL (ref 8.4–10.5)
Chloride: 104 mEq/L (ref 96–112)
Creatinine, Ser: 0.7 mg/dL (ref 0.40–1.20)
GFR: 80.69 mL/min (ref >60.00)
Glucose, Bld: 94 mg/dL (ref 70–99)
Potassium: 4.2 mEq/L (ref 3.5–5.1)
Sodium: 139 mEq/L (ref 135–145)
Total Bilirubin: 0.5 mg/dL (ref 0.2–1.2)
Total Protein: 6.8 g/dL (ref 6.0–8.3)

## 2019-07-30 LAB — IBC + FERRITIN
Ferritin: 46 ng/mL (ref 10.0–291.0)
Iron: 66 ug/dL (ref 42–145)
Saturation Ratios: 21.4 % (ref 20.0–50.0)
Transferrin: 220 mg/dL (ref 212.0–360.0)

## 2019-07-30 LAB — VITAMIN B12: Vitamin B-12: 707 pg/mL (ref 211–911)

## 2019-07-30 LAB — HIGH SENSITIVITY CRP: CRP, High Sensitivity: 42.82 mg/L — ABNORMAL HIGH (ref 0.000–5.000)

## 2019-07-30 LAB — SEDIMENTATION RATE: Sed Rate: 24 mm/hr (ref 0–30)

## 2019-07-30 MED ORDER — NA SULFATE-K SULFATE-MG SULF 17.5-3.13-1.6 GM/177ML PO SOLN: ORAL | 0 refills | Status: DC

## 2019-07-30 MED ORDER — SULFASALAZINE 500 MG PO TBEC: DELAYED_RELEASE_TABLET | ORAL | 3 refills | Status: DC

## 2019-07-30 MED ORDER — MESALAMINE 1000 MG RE SUPP: 1000.0000 mg | Freq: Every day | RECTAL | 0 refills | Status: DC

## 2019-07-30 NOTE — Addendum Note (Signed)
Addended by: Candie Mile on: 07/30/2019 04:34 PM   Modules accepted: Orders

## 2019-07-30 NOTE — Progress Notes (Signed)
Ashley Rivera    [id number]    March 02, 1941  Primary Care Physician:Wile, Jesse Sans, MD  Referring Physician: Haywood Pao, MD [address] [place],  [place] [address]   Chief complaint: Ulcerative proctitis HPI: 79 year old female here for follow-up visit for management of chronic ulcerative proctitis  She has been having remittent lower abdominal cramping and also generalized abdominal discomfort for past few months.  Also complains of intermittent rectal bleeding mostly when she wipes after a bowel movement.  She is concerned that she is having recurrent flare. Her stool also is more loose to semiformed, denies liquid watery diarrhea.  On average she is having 1-2 bowel movements per day.  She is on sulfasalazine 1 g twice daily.  Takes multivitamins with B complex.  Last colonoscopy in 2013.  She had adenomatous polyp removed in 2000.  She is reluctant to undergo surveillance colonoscopy.  Outpatient Encounter Medications as of 07/30/2019  Medication Sig  . ascorbic acid (VITAMIN C) 500 MG tablet Take 500 mg by mouth in the morning and at bedtime.  . B Complex Vitamins (VITAMIN-B COMPLEX PO) Take 1 tablet by mouth daily.  . budesonide-formoterol (SYMBICORT) 160-4.5 MCG/ACT inhaler Inhale 2 puffs into the lungs daily as needed (shortness of breath).   . calcium carbonate (TUMS - DOSED IN MG ELEMENTAL CALCIUM) 500 MG chewable tablet Chew 1 tablet by mouth as needed for indigestion or heartburn.  . carboxymethylcellulose (REFRESH PLUS) 0.5 % SOLN Place 2 drops into both eyes 2 (two) times daily as needed.  . cephALEXin (KEFLEX) 500 MG capsule Take 500 mg by mouth 3 (three) times daily.  . cetirizine (ZYRTEC) 10 MG tablet Take 10 mg by mouth as needed.   . fish oil-omega-3 fatty acids 1000 MG capsule Take by mouth 2 (two) times daily.   . Melatonin 5 MG TABS Take 5 mg by mouth at bedtime as needed.  . Probiotic Product (PROBIOTIC-10 PO) Take 1 tablet by mouth daily.  Align  . sulfaSALAzine (AZULFIDINE) 500 MG EC tablet TAKE 2 TABLETS(500 MG) BY MOUTH TWICE DAILY  . tiotropium (SPIRIVA) 18 MCG inhalation capsule Place 18 mcg into inhaler and inhale daily.  . VENTOLIN HFA 108 (90 BASE) MCG/ACT inhaler Inhale 1 puff into the lungs as needed for shortness of breath.   Marland Kitchen VITAMIN D, CHOLECALCIFEROL, PO Take 2,000 mg by mouth in the morning and at bedtime.   . [DISCONTINUED] sulfaSALAzine (AZULFIDINE) 500 MG EC tablet TAKE 3 TABLETS(1500 MG) BY MOUTH TWICE DAILY (Patient taking differently: TAKE 2 TABLETS(1500 MG) BY MOUTH TWICE DAILY)  . mesalamine (CANASA) 1000 MG suppository Place 1 suppository (1,000 mg total) rectally at bedtime.  . Na Sulfate-K Sulfate-Mg Sulf 17.5-3.13-1.6 GM/177ML SOLN Suprep-Use as directed  . [DISCONTINUED] Cyanocobalamin (VITAMIN B-12 PO) Take 1 tablet by mouth daily.  . [DISCONTINUED] pantoprazole (PROTONIX) 40 MG tablet Take 1 tablet (40 mg total) by mouth daily.  . [DISCONTINUED] ranitidine (ZANTAC) 300 MG tablet Take 1 tablet (300 mg total) by mouth at bedtime.  . [DISCONTINUED] sulfamethoxazole-trimethoprim (BACTRIM DS) 800-160 MG tablet Take 1 tablet by mouth 2 (two) times daily. Pt needs a yearly follow up  . [DISCONTINUED] sulfaSALAzine (AZULFIDINE) 500 MG EC tablet TAKE 3 TABLETS(1500 MG) BY MOUTH TWICE DAILY   No facility-administered encounter medications on file as of 07/30/2019.    Allergies as of 07/30/2019  . (No Known Allergies)    Past Medical History:  Diagnosis Date  .  Allergy   . Asthma    bronchial asthma  . Cataract    left eye  . COPD (chronic obstructive pulmonary disease) ([place])   . Diverticulosis   . Emphysema of lung ([place])   . Genital warts    Dx about 30 years ago   . GERD (gastroesophageal reflux disease)   . Hemorrhoids   . Hx of colonic polyp   . Hx: UTI (urinary tract infection)   . Osteoporosis   . Proctitis 11-12-11  . Urinary incontinence    On occasion    Past Surgical History:    Procedure Laterality Date  . COLONOSCOPY    . DILATION AND CURETTAGE OF UTERUS     50 years ago. Removed rest of placenta from birth  . INNER EAR SURGERY Right 2006   Tumor removed   . TONSILLECTOMY AND ADENOIDECTOMY  1950    Family History  Problem Relation Age of Onset  . Lung cancer Mother   . Colon cancer Paternal Grandmother   . Breast cancer Maternal Aunt   . Esophageal cancer Neg Hx   . Rectal cancer Neg Hx   . Stomach cancer Neg Hx     Social History   Socioeconomic History  . Marital status: Married    Spouse name: Not on file  . Number of children: 2  . Years of education: Not on file  . Highest education level: Not on file  Occupational History  . Occupation: Retired   Tobacco Use  . Smoking status: Former Smoker    Types: Cigarettes  . Smokeless tobacco: Never Used  Substance and Sexual Activity  . Alcohol use: Yes    Alcohol/week: 2.0 standard drinks    Types: 2 Glasses of wine per week  . Drug use: No  . Sexual activity: Yes    Partners: Male    Birth control/protection: Post-menopausal  Other Topics Concern  . Not on file  Social History Narrative   Daily caffeine    Social Determinants of Health   Financial Resource Strain:   . Difficulty of Paying Living Expenses:   Food Insecurity:   . Worried About Charity fundraiser in the Last Year:   . Arboriculturist in the Last Year:   Transportation Needs:   . Film/video editor (Medical):   Marland Kitchen Lack of Transportation (Non-Medical):   Physical Activity:   . Days of Exercise per Week:   . Minutes of Exercise per Session:   Stress:   . Feeling of Stress :   Social Connections:   . Frequency of Communication with Friends and Family:   . Frequency of Social Gatherings with Friends and Family:   . Attends Religious Services:   . Active Member of Clubs or Organizations:   . Attends Archivist Meetings:   Marland Kitchen Marital Status:   Intimate Partner Violence:   . Fear of Current or  Ex-Partner:   . Emotionally Abused:   Marland Kitchen Physically Abused:   . Sexually Abused:       Review of systems: All other review of systems negative except as mentioned in the HPI.   Physical Exam: Vitals:   07/30/19 1028  BP: 128/70  Pulse: 88  Temp: 98.6 F (37 C)   Body mass index is 27.8 kg/m. Gen:      No acute distress Abd:      + bowel sounds; soft, non-tender; no palpable masses, no distension Neuro: alert and oriented x 3 Psych: normal  mood and affect  Data Reviewed:  Reviewed labs, radiology imaging, old records and pertinent past GI work up   Assessment and Plan/Recommendations:  79 year old female with history of chronic ulcerative proctitis  Recent change in bowel habits and intermittent rectal bleeding concerning for possible flare of ulcerative proctitis Check CBC, CMP, ESR, CRP Continue sulfasalazine 1 g twice daily along with folic acid 1 mg daily Advised patient to use Canasa suppository daily at bedtime for possible flare of ulcerative proctitis for next 4 weeks  Schedule colonoscopy to evaluate disease activity, exclude dysplasia or neoplastic lesion The risks and benefits as well as alternatives of endoscopic procedure(s) have been discussed and reviewed. All questions answered. The patient agrees to proceed.  Recent antibiotic use, if diarrhea significantly worse will need to check stool to exclude C. difficile infection  IBD health maintenance: Follow-up iron panel, B12, folate and vitamin D level She is up-to-date with immunizations    This visit required 45 minutes of patient care (this includes precharting, chart review, review of results, face-to-face time used for counseling as well as treatment plan and follow-up. The patient was provided an opportunity to ask questions and all were answered. The patient agreed with the plan and demonstrated an understanding of the instructions.  Damaris Hippo , MD    CC: Tisovec, Fransico Him, MD

## 2019-07-30 NOTE — Patient Instructions (Addendum)
If you are age 79 or older, your body mass index should be between 23-30. Your Body mass index is 27.8 kg/m. If this is out of the aforementioned range listed, please consider follow up with your Primary Care Provider.  If you are age 76 or younger, your body mass index should be between 19-25. Your Body mass index is 27.8 kg/m. If this is out of the aformentioned range listed, please consider follow up with your Primary Care Provider.   You have been scheduled for a colonoscopy. Please follow written instructions given to you at your visit today.  Please pick up your prep supplies at the pharmacy within the next 1-3 days. If you use inhalers (even only as needed), please bring them with you on the day of your procedure. Your physician has requested that you go to www.startemmi.com and enter the access code given to you at your visit today. This web site gives a general overview about your procedure. However, you should still follow specific instructions given to you by our office regarding your preparation for the procedure.  We have sent the following medications to your pharmacy for you to pick up at your convenience: Suprep Sulfasalazine Canasa suppositories  Start Folic Acid 1 daily. (this is over-the-counter).  Your provider has requested that you go to the basement level for lab work before leaving today. Press "B" on the elevator. The lab is located at the first door on the left as you exit the elevator.   Thank you for choosing me and [facility].   Harl Bowie, MD

## 2019-08-02 ENCOUNTER — Telehealth: Payer: Self-pay

## 2019-08-02 NOTE — Telephone Encounter (Signed)
Phone was busy.  Sent patient a Pharmacist, community message that had submitted the Canasa for a prior authorization and would let her know when I had a final answer.

## 2019-08-02 NOTE — Telephone Encounter (Signed)
Submitted prior authorization to Cover My Meds for Canasa suppositories

## 2019-08-03 MED ORDER — MESALAMINE 1000 MG RE SUPP: 1000.0000 mg | Freq: Every day | RECTAL | 2 refills | Status: DC

## 2019-08-03 NOTE — Telephone Encounter (Signed)
Received approval for Canasa through [place] through 05/05/2020.  Resent prescription with approval information and sent patient a mychart message that I had done so.

## 2019-08-04 ENCOUNTER — Telehealth: Payer: Self-pay

## 2019-08-04 ENCOUNTER — Other Ambulatory Visit: Payer: Self-pay

## 2019-08-04 MED ORDER — SULFASALAZINE 500 MG PO TABS: 1000.0000 mg | ORAL_TABLET | Freq: Two times a day (BID) | ORAL | 6 refills | Status: DC

## 2019-08-04 NOTE — Telephone Encounter (Signed)
Okay to switch to equivalent dose

## 2019-08-04 NOTE — Telephone Encounter (Signed)
Yes. I checked with Dr Henrene Pastor. Okay to switch. Thank you

## 2019-08-04 NOTE — Telephone Encounter (Signed)
Doc of the Day Patient of Dr Woodward Ku who is unable to obtain the Sulfasalazine EC 500 mg from the pharmacy due to a back order on it. Is it okay to change her to the Sulfasalazine 598m and will this change the SIG? Thanks

## 2019-08-04 NOTE — Telephone Encounter (Signed)
Sent rx for new [facility] to [facility] [address].  Sent patient mychart message to let her know.

## 2019-08-04 NOTE — Telephone Encounter (Signed)
Just wanted to follow up on this mychart question:  "Dr Silverio Decamp sent a prescription for sulfasalazine to [facility].  They do not have this drug and do not have any idea when it will be available.   [facility].  Their pharmacist checked supplies and said the ec version is not being made anymore but they have plenty of regular sulfasalazine.   So if I can take the regular, will you send a [facility] the regular [facility].    Any problems please let me know"  Is this ok to do?  Thanks!

## 2019-08-09 NOTE — Telephone Encounter (Signed)
Ok thank you 

## 2019-08-11 ENCOUNTER — Encounter: Payer: Self-pay | Admitting: Gastroenterology

## 2019-08-11 DIAGNOSIS — D0471 Carcinoma in situ of skin of right lower limb, including hip: Secondary | ICD-10-CM | POA: Diagnosis not present

## 2019-08-13 ENCOUNTER — Ambulatory Visit (AMBULATORY_SURGERY_CENTER): Payer: PPO | Admitting: Gastroenterology

## 2019-08-13 ENCOUNTER — Other Ambulatory Visit: Payer: Self-pay

## 2019-08-13 ENCOUNTER — Encounter: Payer: Self-pay | Admitting: Gastroenterology

## 2019-08-13 VITALS — BP 122/61 | HR 82 | Temp 95.7°F | Resp 12 | Ht 61.0 in | Wt 147.0 lb

## 2019-08-13 DIAGNOSIS — K51219 Ulcerative (chronic) proctitis with unspecified complications: Secondary | ICD-10-CM | POA: Diagnosis not present

## 2019-08-13 DIAGNOSIS — K5190 Ulcerative colitis, unspecified, without complications: Secondary | ICD-10-CM | POA: Diagnosis not present

## 2019-08-13 DIAGNOSIS — D122 Benign neoplasm of ascending colon: Secondary | ICD-10-CM | POA: Diagnosis not present

## 2019-08-13 DIAGNOSIS — K625 Hemorrhage of anus and rectum: Secondary | ICD-10-CM | POA: Diagnosis not present

## 2019-08-13 MED ORDER — SODIUM CHLORIDE 0.9 % IV SOLN: 500.0000 mL | Freq: Once | INTRAVENOUS | Status: DC

## 2019-08-13 NOTE — Progress Notes (Signed)
To PACU, VSS. Report to [email or web address] 

## 2019-08-13 NOTE — Op Note (Signed)
[place] Patient Name: Ashley Rivera Procedure Date: 08/13/2019 10:20 AM MRN: [id number] Endoscopist: Mauri Pole , MD Age: 79 Referring MD:  Date of Birth: October 09, 1940 Gender: Female Account #: [id number] Procedure:                Colonoscopy Indications:              Evaluation of unexplained GI bleeding presenting                            with Hematochezia Medicines:                Monitored Anesthesia Care Procedure:                Pre-Anesthesia Assessment:                           - Prior to the procedure, a History and Physical                            was performed, and patient medications and                            allergies were reviewed. The patient's tolerance of                            previous anesthesia was also reviewed. The risks                            and benefits of the procedure and the sedation                            options and risks were discussed with the patient.                            All questions were answered, and informed consent                            was obtained. Prior Anticoagulants: The patient has                            taken no previous anticoagulant or antiplatelet                            agents. ASA Grade Assessment: III - A patient with                            severe systemic disease. After reviewing the risks                            and benefits, the patient was deemed in                            satisfactory condition to undergo the procedure.  After obtaining informed consent, the colonoscope                            was passed under direct vision. Throughout the                            procedure, the patient's blood pressure, pulse, and                            oxygen saturations were monitored continuously. The                            Colonoscope was introduced through the anus and                            advanced to the the cecum, identified by                             appendiceal orifice and ileocecal valve. The                            colonoscopy was performed without difficulty. The                            patient tolerated the procedure well. The quality                            of the bowel preparation was adequate. The                            ileocecal valve, appendiceal orifice, and rectum                            were photographed. Scope In: 10:24:46 AM Scope Out: 10:47:54 AM Scope Withdrawal Time: 0 hours 15 minutes 39 seconds  Total Procedure Duration: 0 hours 23 minutes 8 seconds  Findings:                 The perianal and digital rectal examinations were                            normal. Decreased anal spincter tone with partial                            prolapse of rectal mucosa                           A 5 mm polyp was found in the ascending colon. The                            polyp was sessile. The polyp was removed with a                            cold snare. Resection and retrieval were complete.  A less than 1 mm polyp was found in the ascending                            colon. The polyp was sessile. The polyp was removed                            with a cold biopsy forceps. Resection and retrieval                            were complete.                           A 25 mm, non-bleeding polyp was found in the cecum.                            The polyp was granular lateral spreading.                           Scattered small and large-mouthed diverticula were                            found in the entire colon. There was narrowing of                            the colon in association with the diverticular                            opening. There was evidence of diverticular spasm.                            Peri-diverticular erythema was seen. Petechia were                            visualized in association with the diverticular                             opening.                           Non-bleeding internal hemorrhoids were found during                            retroflexion. The hemorrhoids were small.                           A localized area of mildly erythematous mucosa was                            found in the distal rectum likely secondary partial                            retcal mucosal prolapse. Complications:            No immediate complications. Estimated Blood Loss:     Estimated blood loss was  minimal. Impression:               - One 5 mm polyp in the ascending colon, removed                            with a cold snare. Resected and retrieved.                           - One less than 1 mm polyp in the ascending colon,                            removed with a cold biopsy forceps. Resected and                            retrieved.                           - One 25 mm, non-bleeding polyp in the cecum.                           - Severe diverticulosis in the entire examined                            colon. There was narrowing of the colon in                            association with the diverticular opening. There                            was evidence of diverticular spasm.                            Peri-diverticular erythema was seen. Petechia were                            visualized in association with the diverticular                            opening.                           - Non-bleeding internal hemorrhoids.                           - Erythematous mucosa in the distal rectum. Recommendation:           - Patient has a contact number available for                            emergencies. The signs and symptoms of potential                            delayed complications were discussed with the                            patient. Return  to normal activities tomorrow.                            Written discharge instructions were provided to the                            patient.                            - Resume previous diet.                           - Continue present medications.                           - Use Benefiber one teaspoon PO TID indefinitely.                           - Return to GI office at the next available                            appointment to discuss endoscopic mucosal resection                            (EMR) for the large cecal polyp, to be scheduled at                            [facility] endoscopy unit. Mauri Pole, MD 08/13/2019 10:55:26 AM This report has been signed electronically.

## 2019-08-13 NOTE — Patient Instructions (Signed)
YOU HAD AN ENDOSCOPIC PROCEDURE TODAY AT THE [facility]:   Refer to the procedure report that was given to you for any specific questions about what was found during the examination.  If the procedure report does not answer your questions, please call your gastroenterologist to clarify.  If you requested that your care partner not be given the details of your procedure findings, then the procedure report has been included in a sealed envelope for you to review at your convenience later.  YOU SHOULD EXPECT: Some feelings of bloating in the abdomen. Passage of more gas than usual.  Walking can help get rid of the air that was put into your GI tract during the procedure and reduce the bloating. If you had a lower endoscopy (such as a colonoscopy or flexible sigmoidoscopy) you may notice spotting of blood in your stool or on the toilet paper. If you underwent a bowel prep for your procedure, you may not have a normal bowel movement for a few days.  Please Note:  You might notice some irritation and congestion in your nose or some drainage.  This is from the oxygen used during your procedure.  There is no need for concern and it should clear up in a day or so.  SYMPTOMS TO REPORT IMMEDIATELY:   Following lower endoscopy (colonoscopy or flexible sigmoidoscopy):  Excessive amounts of blood in the stool  Significant tenderness or worsening of abdominal pains  Swelling of the abdomen that is new, acute  Fever of 100F or higher  For urgent or emergent issues, a gastroenterologist can be reached at any hour by calling [phone]. Do not use MyChart messaging for urgent concerns.    DIET:  We do recommend a small meal at first, but then you may proceed to your regular diet.  Drink plenty of fluids but you should avoid alcoholic beverages for 24 hours.  ACTIVITY:  You should plan to take it easy for the rest of today and you should NOT DRIVE or use heavy machinery until tomorrow (because  of the sedation medicines used during the test).    FOLLOW UP: Our staff will call the number listed on your records 48-72 hours following your procedure to check on you and address any questions or concerns that you may have regarding the information given to you following your procedure. If we do not reach you, we will leave a message.  We will attempt to reach you two times.  During this call, we will ask if you have developed any symptoms of COVID 19. If you develop any symptoms (ie: fever, flu-like symptoms, shortness of breath, cough etc.) before then, please call [phone].  If you test positive for Covid 19 in the 2 weeks post procedure, please call and report this information to us.    If any biopsies were taken you will be contacted by phone or by letter within the next 1-3 weeks.  Please call us at [phone] if you have not heard about the biopsies in 3 weeks.    SIGNATURES/CONFIDENTIALITY: You and/or your care partner have signed paperwork which will be entered into your electronic medical record.  These signatures attest to the fact that that the information above on your After Visit Summary has been reviewed and is understood.  Full responsibility of the confidentiality of this discharge information lies with you and/or your care-partner. 

## 2019-08-13 NOTE — Progress Notes (Signed)
Called to room to assist during endoscopic procedure.  Patient ID and intended procedure confirmed with present staff. Received instructions for my participation in the procedure from the performing physician.  

## 2019-08-17 ENCOUNTER — Telehealth: Payer: Self-pay

## 2019-08-17 NOTE — Telephone Encounter (Signed)
  Follow up Call-  Call back number 08/13/2019  Post procedure Call Back phone  # [phone]  Permission to leave phone message Yes  Some recent data might be hidden     Patient questions:  Do you have a fever, pain , or abdominal swelling? No. Pain Score  0 *  Have you tolerated food without any problems? Yes.    Have you been able to return to your normal activities? Yes.    Do you have any questions about your discharge instructions: Diet   No. Medications  No. Follow up visit  No.  Do you have questions or concerns about your Care? No.  Actions: * If pain score is 4 or above: No action needed, pain <4. 1. Have you developed a fever since your procedure? no  2.   Have you had an respiratory symptoms (SOB or cough) since your procedure? no  3.   Have you tested positive for COVID 19 since your procedure no  4.   Have you had any family members/close contacts diagnosed with the COVID 19 since your procedure?  no   If yes to any of these questions please route to Joylene John, RN and Erenest Rasher, RN

## 2019-08-19 DIAGNOSIS — N200 Calculus of kidney: Secondary | ICD-10-CM | POA: Diagnosis not present

## 2019-08-19 DIAGNOSIS — N130 Hydronephrosis with ureteropelvic junction obstruction: Secondary | ICD-10-CM | POA: Diagnosis not present

## 2019-08-19 DIAGNOSIS — R3915 Urgency of urination: Secondary | ICD-10-CM | POA: Diagnosis not present

## 2019-08-24 ENCOUNTER — Other Ambulatory Visit: Payer: Self-pay

## 2019-08-24 ENCOUNTER — Encounter: Payer: Self-pay | Admitting: Gastroenterology

## 2019-08-24 MED ORDER — SULFASALAZINE 500 MG PO TABS: 1000.0000 mg | ORAL_TABLET | Freq: Two times a day (BID) | ORAL | 6 refills | Status: DC

## 2019-09-15 DIAGNOSIS — Z85828 Personal history of other malignant neoplasm of skin: Secondary | ICD-10-CM | POA: Diagnosis not present

## 2019-09-15 DIAGNOSIS — D0471 Carcinoma in situ of skin of right lower limb, including hip: Secondary | ICD-10-CM | POA: Diagnosis not present

## 2019-09-15 DIAGNOSIS — L570 Actinic keratosis: Secondary | ICD-10-CM | POA: Diagnosis not present

## 2019-09-15 DIAGNOSIS — L821 Other seborrheic keratosis: Secondary | ICD-10-CM | POA: Diagnosis not present

## 2019-09-27 ENCOUNTER — Encounter: Payer: Self-pay | Admitting: Gastroenterology

## 2019-09-27 ENCOUNTER — Ambulatory Visit: Payer: PPO | Admitting: Gastroenterology

## 2019-09-27 VITALS — BP 126/70 | HR 88 | Ht 61.0 in | Wt 148.1 lb

## 2019-09-27 DIAGNOSIS — K5790 Diverticulosis of intestine, part unspecified, without perforation or abscess without bleeding: Secondary | ICD-10-CM | POA: Diagnosis not present

## 2019-09-27 DIAGNOSIS — K635 Polyp of colon: Secondary | ICD-10-CM | POA: Diagnosis not present

## 2019-09-27 DIAGNOSIS — K51219 Ulcerative (chronic) proctitis with unspecified complications: Secondary | ICD-10-CM | POA: Diagnosis not present

## 2019-09-27 MED ORDER — NA SULFATE-K SULFATE-MG SULF 17.5-3.13-1.6 GM/177ML PO SOLN: 1.0000 | Freq: Once | ORAL | 0 refills | Status: AC

## 2019-09-27 NOTE — Patient Instructions (Signed)
You have been scheduled for a colonoscopy. Please follow written instructions given to you at your visit today.  Please pick up your prep supplies at the pharmacy within the next 1-3 days. If you use inhalers (even only as needed), please bring them with you on the day of your procedure.  Due to recent COVID-19 restrictions implemented by our local and state authorities and in an effort to keep both patients and staff as safe as possible, our hospital system now requires COVID-19 testing prior to any scheduled hospital procedure. Please go to our [facility] location drive thru testing site ([address] [place], [place] [address]) on 10/25/2019 at  9:05am. There will be multiple testing areas, the first checkpoint being for pre-procedure/surgery testing. Get into the right (yellow) lane that leads to the PAT testing team. You will not be billed at the time of testing but may receive a bill later depending on your insurance. The approximate cost of the test is $100. You must agree to quarantine from the time of your testing until the procedure date on 10/28/2019 . This should include staying at home with ONLY the people you live with. Avoid take-out, grocery store shopping or leaving the house for any non-emergent reason. Failure to have your COVID-19 test done on the date and time you have been scheduled will result in cancellation of procedure. Please call our office at [phone] if you have any questions.

## 2019-09-27 NOTE — Progress Notes (Signed)
Ashley Rivera    [id number]    1940/06/30  Primary Care Physician:Wile, Jesse Sans, MD  Referring Physician: Michael Boston, MD [address] [place],  [place] [address]   Chief complaint: Large cecal polyp  HPI: 79 year old female here for follow-up visit to discuss colonoscopy findings and endoscopic mucosal resection of large cecal polyp Colonoscopy August 13, 2019: 25 mm polyps lateral spreading cecal polyp not removed.  2 small sessile polyps removed.  Diverticulosis and hemorrhoids.  Bowel habits are at baseline with 1-2 soft bowel movements daily.  Denies any abdominal pain or rectal bleeding She is continuing sulfasalazine  Last colonoscopy in 2013.She had adenomatous polyp removed in 2000.She has been reluctant to undergo surveillance colonoscopy in the past.  Outpatient Encounter Medications as of 09/27/2019  Medication Sig  . ascorbic acid (VITAMIN C) 500 MG tablet Take 500 mg by mouth in the morning and at bedtime.  . B Complex Vitamins (VITAMIN-B COMPLEX PO) Take 1 tablet by mouth daily.  . budesonide-formoterol (SYMBICORT) 160-4.5 MCG/ACT inhaler Inhale 2 puffs into the lungs daily as needed (shortness of breath).   . calcium carbonate (TUMS - DOSED IN MG ELEMENTAL CALCIUM) 500 MG chewable tablet Chew 1 tablet by mouth as needed for indigestion or heartburn.  . carboxymethylcellulose (REFRESH PLUS) 0.5 % SOLN Place 2 drops into both eyes 2 (two) times daily as needed.  . cetirizine (ZYRTEC) 10 MG tablet Take 10 mg by mouth as needed.   . fish oil-omega-3 fatty acids 1000 MG capsule Take by mouth 2 (two) times daily.   Marland Kitchen FOLIC ACID PO Take 1 tablet by mouth daily.  . Melatonin 5 MG TABS Take 5 mg by mouth at bedtime as needed.  . Probiotic Product (PROBIOTIC-10 PO) Take 1 tablet by mouth daily. Align  . sulfaSALAzine (AZULFIDINE) 500 MG tablet Take 2 tablets (1,000 mg total) by mouth 2 (two) times daily.  Marland Kitchen tiotropium (SPIRIVA) 18 MCG inhalation capsule Place 18  mcg into inhaler and inhale daily.  . VENTOLIN HFA 108 (90 BASE) MCG/ACT inhaler Inhale 1 puff into the lungs as needed for shortness of breath.   Marland Kitchen VITAMIN D, CHOLECALCIFEROL, PO Take 2,000 mg by mouth in the morning and at bedtime.   . Wheat Dextrin (BENEFIBER PO) Take 1 Dose by mouth in the morning and at bedtime.  . mesalamine (CANASA) 1000 MG suppository Place 1 suppository (1,000 mg total) rectally at bedtime. (Patient not taking: Reported on 09/27/2019)  . [DISCONTINUED] cephALEXin (KEFLEX) 500 MG capsule Take 500 mg by mouth 3 (three) times daily.   No facility-administered encounter medications on file as of 09/27/2019.    Allergies as of 09/27/2019  . (No Known Allergies)    Past Medical History:  Diagnosis Date  . Allergy   . Asthma    bronchial asthma  . Cataract    left eye  . COPD (chronic obstructive pulmonary disease) ([place])   . Diverticulosis   . Emphysema of lung ([place])   . Genital warts    Dx about 30 years ago   . GERD (gastroesophageal reflux disease)   . Hemorrhoids   . Hx of colonic polyp   . Hx: UTI (urinary tract infection)   . Kidney stone   . Osteoporosis   . Proctitis 11-12-11  . Urinary incontinence    On occasion    Past Surgical History:  Procedure Laterality Date  . COLONOSCOPY    . DILATION  AND CURETTAGE OF UTERUS     50 years ago. Removed rest of placenta from birth  . INNER EAR SURGERY Right 2006   Tumor removed   . TONSILLECTOMY AND ADENOIDECTOMY  1950    Family History  Problem Relation Age of Onset  . Lung cancer Mother   . Colon cancer Paternal Grandmother   . Breast cancer Maternal Aunt   . Esophageal cancer Neg Hx   . Rectal cancer Neg Hx   . Stomach cancer Neg Hx     Social History   Socioeconomic History  . Marital status: Married    Spouse name: Not on file  . Number of children: 2  . Years of education: Not on file  . Highest education level: Not on file  Occupational History  . Occupation: Retired   Tobacco Use   . Smoking status: Former Smoker    Types: Cigarettes  . Smokeless tobacco: Never Used  Substance and Sexual Activity  . Alcohol use: Yes    Alcohol/week: 2.0 standard drinks    Types: 2 Glasses of wine per week  . Drug use: No  . Sexual activity: Yes    Partners: Male    Birth control/protection: Post-menopausal  Other Topics Concern  . Not on file  Social History Narrative   Daily caffeine    Social Determinants of Health   Financial Resource Strain:   . Difficulty of Paying Living Expenses:   Food Insecurity:   . Worried About Charity fundraiser in the Last Year:   . Arboriculturist in the Last Year:   Transportation Needs:   . Film/video editor (Medical):   Marland Kitchen Lack of Transportation (Non-Medical):   Physical Activity:   . Days of Exercise per Week:   . Minutes of Exercise per Session:   Stress:   . Feeling of Stress :   Social Connections:   . Frequency of Communication with Friends and Family:   . Frequency of Social Gatherings with Friends and Family:   . Attends Religious Services:   . Active Member of Clubs or Organizations:   . Attends Archivist Meetings:   Marland Kitchen Marital Status:   Intimate Partner Violence:   . Fear of Current or Ex-Partner:   . Emotionally Abused:   Marland Kitchen Physically Abused:   . Sexually Abused:       Review of systems: All other review of systems negative except as mentioned in the HPI.   Physical Exam: Vitals:   09/27/19 0811  BP: 126/70  Pulse: 88   Body mass index is 27.99 kg/m. Gen:      No acute distress Neuro: alert and oriented x 3 Psych: normal mood and affect  Data Reviewed:  Reviewed labs, radiology imaging, old records and pertinent past GI work up   Assessment and Plan/Recommendations:  79 year old female with history of chronic ulcerative proctitis, in clinical remission on sulfasalazine and Canasa suppository as needed  Large lateral spreading cecal polyp on recent colonoscopy We will plan to  schedule for endoscopic mucosal resection at [facility] The risks and benefits as well as alternatives of endoscopic procedure(s) have been discussed and reviewed. All questions answered. The patient agrees to proceed.  Return in 3 months or sooner if needed  This visit required 35 minutes of patient care (this includes precharting, chart review, review of results, face-to-face time used for counseling as well as treatment plan and follow-up. The patient was provided an opportunity to  ask questions and all were answered. The patient agreed with the plan and demonstrated an understanding of the instructions.  Damaris Hippo , MD    CC: Michael Boston, MD

## 2019-09-29 ENCOUNTER — Encounter: Payer: Self-pay | Admitting: Gastroenterology

## 2019-10-09 IMAGING — MR MR HEAD WO/W CM
13 series · 48 of 48 positions shown · IV contrast (multihance)
Comparison: 11/06/2015

CLINICAL DATA: Follow-up meningioma.

Creatinine was obtained on site at [HOSPITAL] at [HOSPITAL].
Results: Creatinine 0.8 mg/dL.
EXAM:
MRI HEAD WITHOUT AND WITH CONTRAST
TECHNIQUE: Multiplanar, multiecho pulse sequences of the brain and surrounding
structures were obtained without and with intravenous contrast.
CONTRAST:  13mL MULTIHANCE GADOBENATE DIMEGLUMINE 529 MG/ML IV SOLN

[Series 3: DWI · axial · 3.0mm · 1.80mm/px · z∈[-53,+92]mm · 7 of 100 slices shown (1 of 4)]
[im 1/100]
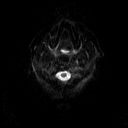
[im 17/100]
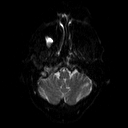
[im 34/100]
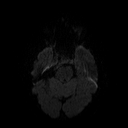
[im 50/100]
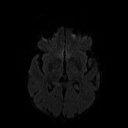
[im 67/100]
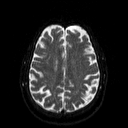
[im 83/100]
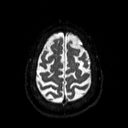
[im 100/100]
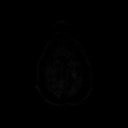

[Series 4: DWI · axial · 3.0mm · 1.80mm/px · z∈[-53,+92]mm · 3 of 50 slices shown (2 of 4)]
[im 1/50]
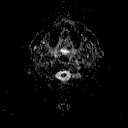
[im 25/50]
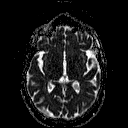
[im 50/50]
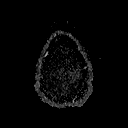

[Series 5: DWI · coronal · 5.0mm · 1.80mm/px · 4 of 67 slices shown (3 of 4)]
[im 1/67]
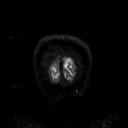
[im 23/67]
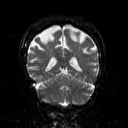
[im 45/67]
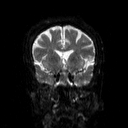
[im 67/67]
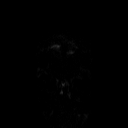

[Series 6: DWI · coronal · 5.0mm · 1.80mm/px · 2 of 34 slices shown (4 of 4)]
[im 1/34]
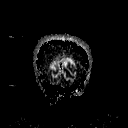
[im 34/34]
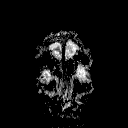

[Series 7: T2 · axial · 5.0mm · 0.51mm/px · 1 of 22 slices shown (1 of 2)]
[im 1/22]
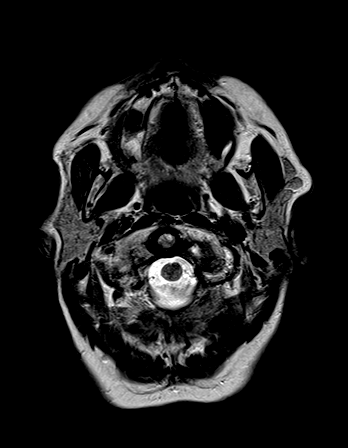

[Series 8: T1 · sagittal · 5.0mm · 0.45mm/px · 1 of 21 slices shown]
[im 1/21]
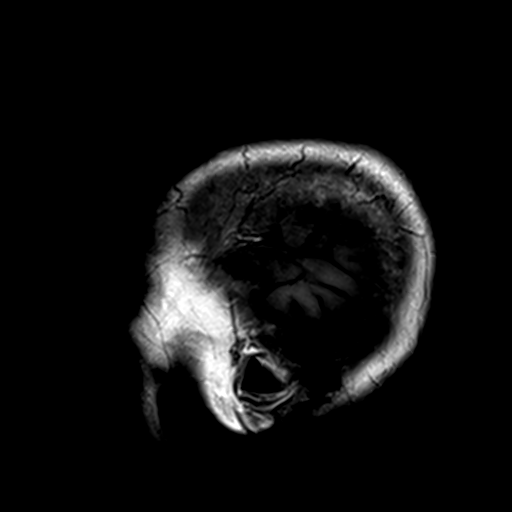

[Series 9: FLAIR · axial · 3.0mm · 0.45mm/px · z∈[-56,+95]mm · 2 of 34 slices shown]
[im 1/34]
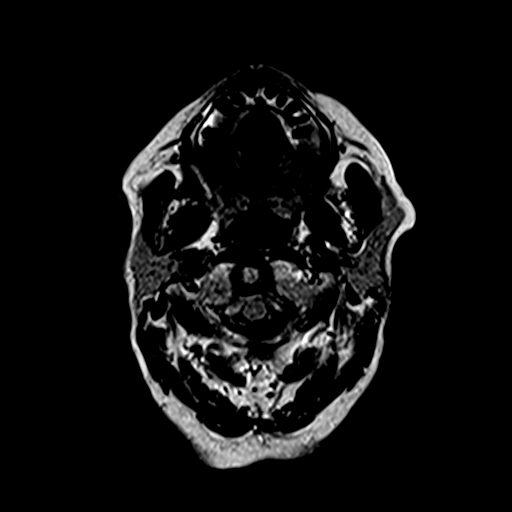
[im 34/34]
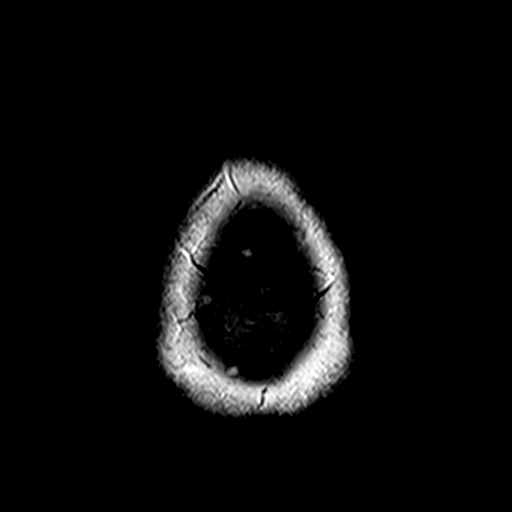

[Series 11: swi_images · axial · 4.0mm · 0.90mm/px · z∈[-57,+96]mm · 3 of 40 slices shown]
[im 1/40]
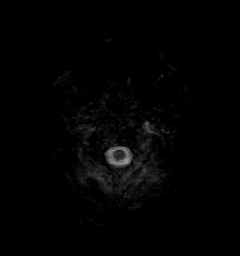
[im 20/40]
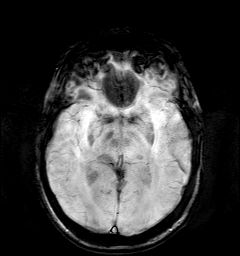
[im 40/40]
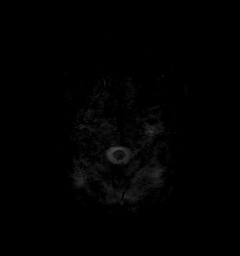

[Series 12: t1_mpr_tra · axial · 1.0mm · 0.75mm/px · z∈[-53,+88]mm · 10 of 144 slices shown (1 of 2)]
[im 1/144]
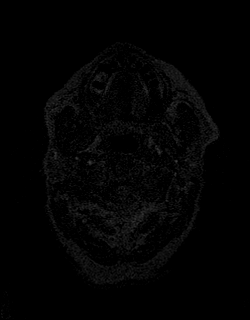
[im 16/144]
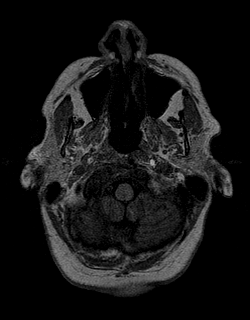
[im 32/144]
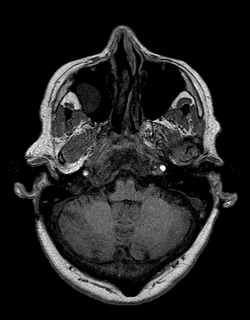
[im 48/144]
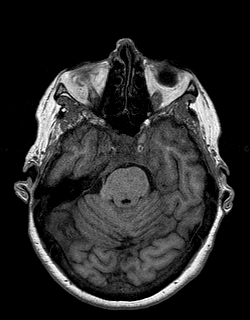
[im 64/144]
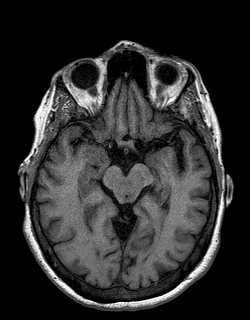
[im 80/144]
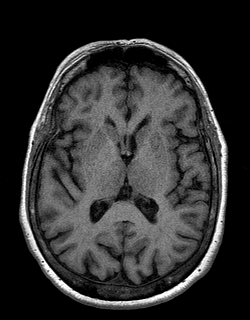
[im 96/144]
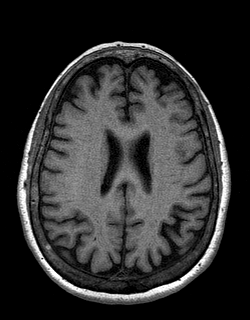
[im 112/144]
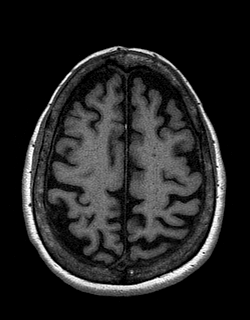
[im 128/144]
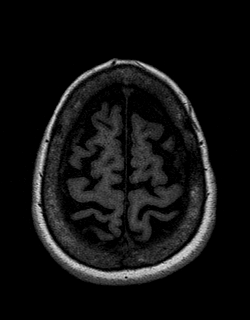
[im 144/144]
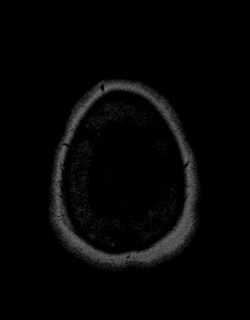

[Series 13: T2 · coronal · 5.0mm · 0.45mm/px · 2 of 25 slices shown (2 of 2)]
[im 1/25]
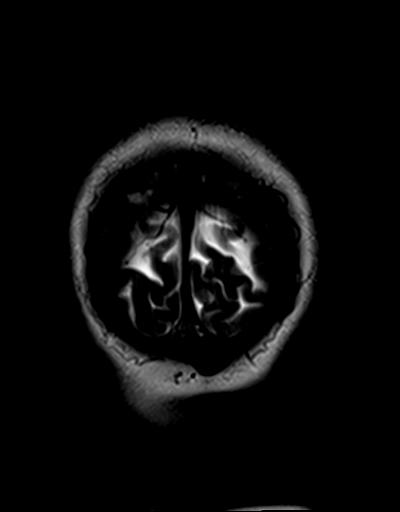
[im 25/25]
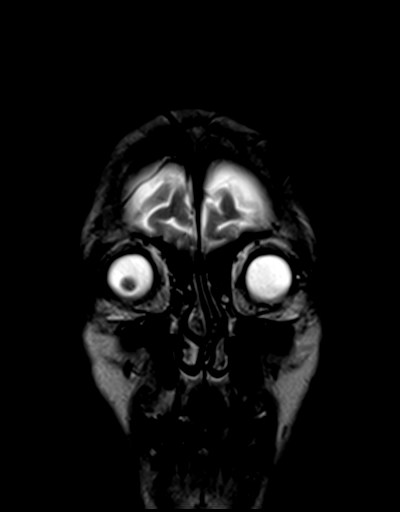

[Series 14: t1_mpr_tra · axial · 1.0mm · 0.75mm/px · z∈[-53,+88]mm · 10 of 144 slices shown (2 of 2)]
[im 1/144]
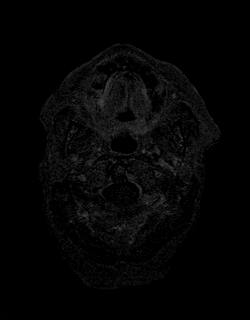
[im 16/144]
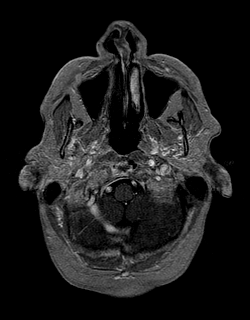
[im 32/144]
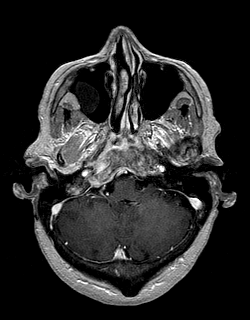
[im 48/144]
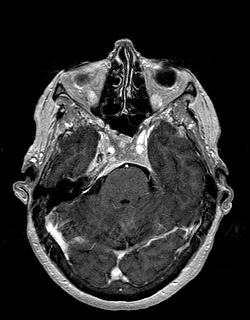
[im 64/144]
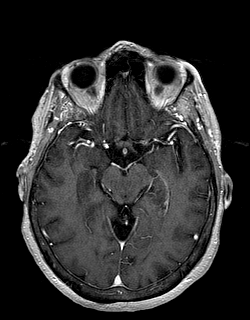
[im 80/144]
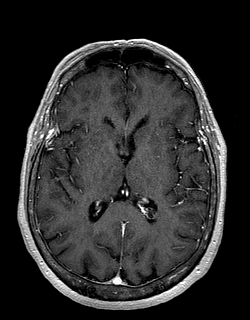
[im 96/144]
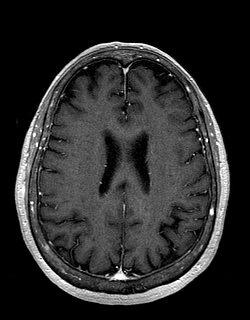
[im 112/144]
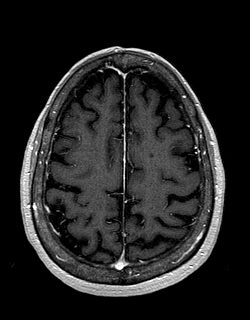
[im 128/144]
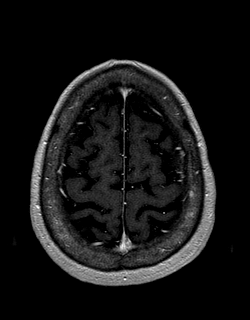
[im 144/144]
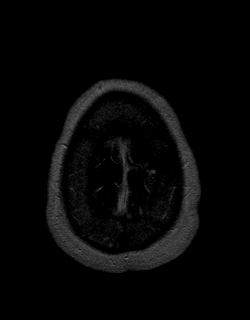

[Series 15: post cor · coronal · 5.0mm · 0.45mm/px · 2 of 25 slices shown]
[im 1/25]
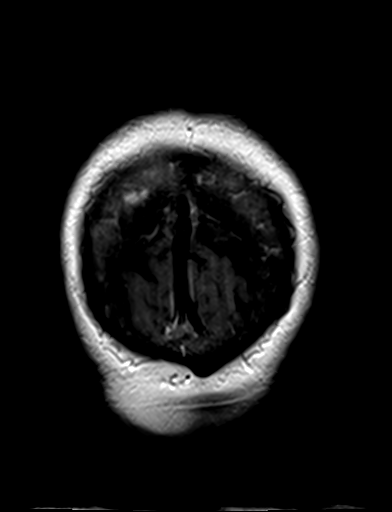
[im 25/25]
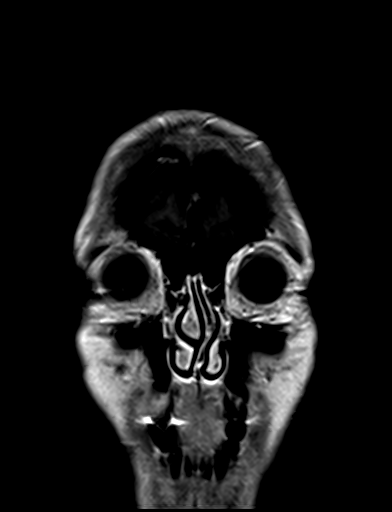

[Series 16: post sag (optional · sagittal · 5.0mm · 0.45mm/px · 1 of 21 slices shown]
[im 1/21]
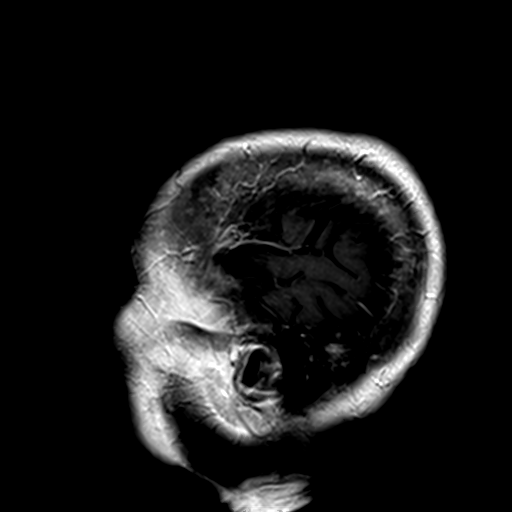

[48 of 48 positions shown; findings below may reference images not displayed]

FINDINGS: Brain: There is no evidence of acute infarct, intracranial
hemorrhage, midline shift, or extra-axial fluid collection. The
ventricles and sulci are normal for age. Scattered small foci of T2
hyperintensity in the cerebral white matter bilaterally are
unchanged and nonspecific but compatible with minimal chronic small
vessel ischemic disease, not greater than expected for age.

Infiltrative central skull base mass has not significantly changed
in extent or overall volume. Tumor is again noted to involve the
right greater than left occipital condyles with diffuse infiltration
of the clivus and extension superiorly to the sella. There is
effacement of the right greater than left prepontine cistern with
right greater than left cavernous sinus and Meckel's cave
involvement. There is tumor in the right jugular foramen. Linear
enhancement extending into the right internal auditory canal is
unchanged. Mild tumor extension into the suprasellar cistern is
again noted without mass effect on the optic chiasm. No significant
cavernous ICA narrowing is evident. Separate from the above
described infiltrative tumor is a 10 mm focus of asymmetric nodular
extra-axial enhancement anterolaterally in the left middle cranial
fossa, unchanged (series 14, image 45).

Vascular: Major intracranial vascular flow voids are preserved.

Skull and upper cervical spine: Infiltrative skull base tumor as
above.

Sinuses/Orbits: Left cataract extraction. Right maxillary sinus
mucous retention cyst. Small right mastoid effusion.

Other: None.
IMPRESSION: 1. Unchanged infiltrating central skull base mass consistent with
history of meningioma.
2. Suspected additional 1 cm meningioma laterally in the left middle
cranial fossa.
3. No acute intracranial abnormality.

## 2019-10-18 DIAGNOSIS — N130 Hydronephrosis with ureteropelvic junction obstruction: Secondary | ICD-10-CM | POA: Diagnosis not present

## 2019-10-18 DIAGNOSIS — N200 Calculus of kidney: Secondary | ICD-10-CM | POA: Diagnosis not present

## 2019-10-25 ENCOUNTER — Other Ambulatory Visit (HOSPITAL_COMMUNITY)
Admission: RE | Admit: 2019-10-25 | Discharge: 2019-10-25 | Disposition: A | Discharge status: Home / Self Care | Payer: PPO | Source: Ambulatory Visit | Attending: Gastroenterology | Admitting: Gastroenterology

## 2019-10-25 DIAGNOSIS — Z20822 Contact with and (suspected) exposure to covid-19: Secondary | ICD-10-CM | POA: Diagnosis not present

## 2019-10-25 DIAGNOSIS — Z01812 Encounter for preprocedural laboratory examination: Secondary | ICD-10-CM | POA: Insufficient documentation

## 2019-10-25 LAB — SARS CORONAVIRUS 2 (TAT 6-24 HRS): SARS Coronavirus 2: NEGATIVE

## 2019-10-26 ENCOUNTER — Other Ambulatory Visit: Payer: Self-pay | Admitting: Urology

## 2019-10-27 ENCOUNTER — Other Ambulatory Visit (HOSPITAL_COMMUNITY): Payer: Self-pay

## 2019-10-27 NOTE — Progress Notes (Signed)
DUE TO COVID-19 ONLY ONE VISITOR IS ALLOWED TO COME WITH YOU AND STAY IN THE WAITING ROOM ONLY DURING PRE OP AND PROCEDURE DAY OF SURGERY. TWO VISITOR MAY VISIT WITH YOU AFTER SURGERY IN YOUR PRIVATE ROOM DURING VISITING HOURS ONLY!  YOU NEED TO HAVE A COVID 19 TEST ON_7-2-21______ @_______ , THIS TEST MUST BE DONE BEFORE SURGERY, COME  [place] , [address].  ([place]) ONCE YOUR COVID TEST IS COMPLETED, PLEASE BEGIN THE QUARANTINE INSTRUCTIONS AS OUTLINED IN YOUR HANDOUT.                Ashley Rivera  10/27/2019   Your procedure is scheduled on: 11-09-19   Report to [facility]  Entrance   Report to admitting at       1200  pM     Call this number if you have problems the morning of surgery [phone]    Remember: Do not eat food :After Midnight.  You may have clear liquids until 0800 am then nothing by mouth    CLEAR LIQUID DIET   Foods Allowed                                                                     Foods Excluded  Coffee and tea, regular and decaf                             liquids that you cannot  Plain Jell-O any favor except red or purple                                           see through such as: Fruit ices (not with fruit pulp)                                     milk, soups, orange juice  Iced Popsicles                                    All solid food Carbonated beverages, regular and diet                                    Cranberry, grape and apple juices Sports drinks like Gatorade Lightly seasoned clear broth or consume(fat free) Sugar, honey syrup   _____________________________________________________________________    BRUSH YOUR TEETH MORNING OF SURGERY AND RINSE YOUR MOUTH OUT, NO CHEWING GUM CANDY OR MINTS.     Take these medicines the morning of surgery with A SIP OF WATER: zyrtec and inhalers and bring with you                               You may not have any metal on your body  including hair pins and  piercings  Do not wear jewelry, make-up, lotions, powders or perfumes, deodorant             Do not wear nail polish on your fingernails.  Do not shave  48 hours prior to surgery.              Men may shave face and neck.   Do not bring valuables to the hospital. [place].  Contacts, dentures or bridgework may not be worn into surgery.  Leave suitcase in the car. After surgery it may be brought to your room.     Patients discharged the day of surgery will not be allowed to drive home. IF YOU ARE HAVING SURGERY AND GOING HOME THE SAME DAY, YOU MUST HAVE AN ADULT TO DRIVE YOU HOME AND BE WITH YOU FOR 24 HOURS. YOU MAY GO HOME BY TAXI OR UBER OR ORTHERWISE, BUT AN ADULT MUST ACCOMPANY YOU HOME AND STAY WITH YOU FOR 24 HOURS.  Name and phone number of your driver:  Special Instructions: N/A              Please read over the following fact sheets you were given: _____________________________________________________________________  [facility] - Preparing for Surgery Before surgery, you can play an important role.  Because skin is not sterile, your skin needs to be as free of germs as possible.  You can reduce the number of germs on your skin by washing with CHG (chlorahexidine gluconate) soap before surgery.  CHG is an antiseptic cleaner which kills germs and bonds with the skin to continue killing germs even after washing. Please DO NOT use if you have an allergy to CHG or antibacterial soaps.  If your skin becomes reddened/irritated stop using the CHG and inform your nurse when you arrive at Short Stay. Do not shave (including legs and underarms) for at least 48 hours prior to the first CHG shower.  You may shave your face/neck. Please follow these instructions carefully:  1.  Shower with CHG Soap the night before surgery and the  morning of Surgery.  2.  If you choose to wash your hair, wash your hair first  as usual with your  normal  shampoo.  3.  After you shampoo, rinse your hair and body thoroughly to remove the  shampoo.                           4.  Use CHG as you would any other liquid soap.  You can apply chg directly  to the skin and wash                       Gently with a scrungie or clean washcloth.  5.  Apply the CHG Soap to your body ONLY FROM THE NECK DOWN.   Do not use on face/ open                           Wound or open sores. Avoid contact with eyes, ears mouth and genitals (private parts).                       Wash face,  Genitals (private parts) with your normal soap.             6.  Wash thoroughly, paying special attention to the area where your surgery  will be performed.  7.  Thoroughly rinse your body with warm water from the neck down.  8.  DO NOT shower/wash with your normal soap after using and rinsing off  the CHG Soap.                9.  Pat yourself dry with a clean towel.            10.  Wear clean pajamas.            11.  Place clean sheets on your bed the night of your first shower and do not  sleep with pets. Day of Surgery : Do not apply any lotions/deodorants the morning of surgery.  Please wear clean clothes to the hospital/surgery center.  FAILURE TO FOLLOW THESE INSTRUCTIONS MAY RESULT IN THE CANCELLATION OF YOUR SURGERY PATIENT SIGNATURE_________________________________  NURSE SIGNATURE__________________________________  ________________________________________________________________________

## 2019-10-28 ENCOUNTER — Other Ambulatory Visit: Payer: Self-pay

## 2019-10-28 ENCOUNTER — Encounter (HOSPITAL_COMMUNITY): Payer: PPO

## 2019-10-28 ENCOUNTER — Ambulatory Visit (HOSPITAL_COMMUNITY): Payer: PPO | Admitting: Anesthesiology

## 2019-10-28 ENCOUNTER — Encounter (HOSPITAL_COMMUNITY): Payer: Self-pay | Admitting: Gastroenterology

## 2019-10-28 ENCOUNTER — Encounter (HOSPITAL_COMMUNITY)
Admission: RE | Disposition: A | Discharge status: Home / Self Care | Payer: Self-pay | Source: Home / Self Care | Attending: Gastroenterology

## 2019-10-28 ENCOUNTER — Ambulatory Visit (HOSPITAL_COMMUNITY)
Admission: RE | Admit: 2019-10-28 | Discharge: 2019-10-28 | Disposition: A | Discharge status: Home / Self Care | Payer: PPO | Attending: Gastroenterology | Admitting: Gastroenterology

## 2019-10-28 DIAGNOSIS — K5730 Diverticulosis of large intestine without perforation or abscess without bleeding: Secondary | ICD-10-CM | POA: Insufficient documentation

## 2019-10-28 DIAGNOSIS — Z791 Long term (current) use of non-steroidal anti-inflammatories (NSAID): Secondary | ICD-10-CM | POA: Diagnosis not present

## 2019-10-28 DIAGNOSIS — J439 Emphysema, unspecified: Secondary | ICD-10-CM | POA: Diagnosis not present

## 2019-10-28 DIAGNOSIS — K219 Gastro-esophageal reflux disease without esophagitis: Secondary | ICD-10-CM | POA: Diagnosis not present

## 2019-10-28 DIAGNOSIS — K648 Other hemorrhoids: Secondary | ICD-10-CM | POA: Insufficient documentation

## 2019-10-28 DIAGNOSIS — K5790 Diverticulosis of intestine, part unspecified, without perforation or abscess without bleeding: Secondary | ICD-10-CM

## 2019-10-28 DIAGNOSIS — Z79899 Other long term (current) drug therapy: Secondary | ICD-10-CM | POA: Insufficient documentation

## 2019-10-28 DIAGNOSIS — D120 Benign neoplasm of cecum: Secondary | ICD-10-CM | POA: Diagnosis not present

## 2019-10-28 DIAGNOSIS — J449 Chronic obstructive pulmonary disease, unspecified: Secondary | ICD-10-CM | POA: Diagnosis not present

## 2019-10-28 DIAGNOSIS — K51219 Ulcerative (chronic) proctitis with unspecified complications: Secondary | ICD-10-CM

## 2019-10-28 DIAGNOSIS — Z87891 Personal history of nicotine dependence: Secondary | ICD-10-CM | POA: Diagnosis not present

## 2019-10-28 DIAGNOSIS — K635 Polyp of colon: Secondary | ICD-10-CM

## 2019-10-28 DIAGNOSIS — M810 Age-related osteoporosis without current pathological fracture: Secondary | ICD-10-CM | POA: Diagnosis not present

## 2019-10-28 HISTORY — PX: HEMOSTASIS CLIP PLACEMENT: SHX6857

## 2019-10-28 HISTORY — PX: POLYPECTOMY: SHX5525

## 2019-10-28 HISTORY — PX: COLONOSCOPY WITH PROPOFOL: SHX5780

## 2019-10-28 HISTORY — PX: ENDOSCOPIC MUCOSAL RESECTION: SHX6839

## 2019-10-28 HISTORY — PX: SUBMUCOSAL LIFTING INJECTION: SHX6855

## 2019-10-28 HISTORY — PX: SUBMUCOSAL TATTOO INJECTION: SHX6856

## 2019-10-28 SURGERY — COLONOSCOPY WITH PROPOFOL
Anesthesia: Monitor Anesthesia Care

## 2019-10-28 MED ORDER — SODIUM CHLORIDE 0.9 % IV SOLN: INTRAVENOUS | Status: DC

## 2019-10-28 MED ORDER — SPOT INK MARKER SYRINGE KIT
PACK | SUBMUCOSAL | Status: AC
  Filled 2019-10-28: qty 5

## 2019-10-28 MED ORDER — PROPOFOL 500 MG/50ML IV EMUL
INTRAVENOUS | Status: DC | PRN
  Administered 2019-10-28: 125 ug/kg/min via INTRAVENOUS

## 2019-10-28 MED ORDER — LACTATED RINGERS IV SOLN: INTRAVENOUS | Status: DC

## 2019-10-28 MED ORDER — SPOT INK MARKER SYRINGE KIT
PACK | SUBMUCOSAL | Status: DC | PRN
  Administered 2019-10-28: 1 mL via SUBMUCOSAL

## 2019-10-28 MED ORDER — LACTATED RINGERS IV SOLN
INTRAVENOUS | Status: AC | PRN
  Administered 2019-10-28: 1000 mL via INTRAVENOUS

## 2019-10-28 MED ORDER — LIDOCAINE HCL (CARDIAC) PF 100 MG/5ML IV SOSY
PREFILLED_SYRINGE | INTRAVENOUS | Status: DC | PRN
  Administered 2019-10-28: 75 mg via INTRAVENOUS

## 2019-10-28 MED ORDER — PROPOFOL 1000 MG/100ML IV EMUL
INTRAVENOUS | Status: AC
  Filled 2019-10-28: qty 100

## 2019-10-28 SURGICAL SUPPLY — 22 items

## 2019-10-28 NOTE — Discharge Instructions (Signed)
YOU HAD AN ENDOSCOPIC PROCEDURE TODAY: Refer to the procedure report and other information in the discharge instructions given to you for any specific questions about what was found during the examination. If this information does not answer your questions, please call [place] office at [phone] to clarify.   YOU SHOULD EXPECT: Some feelings of bloating in the abdomen. Passage of more gas than usual. Walking can help get rid of the air that was put into your GI tract during the procedure and reduce the bloating. If you had a lower endoscopy (such as a colonoscopy or flexible sigmoidoscopy) you may notice spotting of blood in your stool or on the toilet paper. Some abdominal soreness may be present for a day or two, also.  DIET: Your first meal following the procedure should be a light meal and then it is ok to progress to your normal diet. A half-sandwich or bowl of soup is an example of a good first meal. Heavy or fried foods are harder to digest and may make you feel nauseous or bloated. Drink plenty of fluids but you should avoid alcoholic beverages for 24 hours. If you had a esophageal dilation, please see attached instructions for diet.    ACTIVITY: Your care partner should take you home directly after the procedure. You should plan to take it easy, moving slowly for the rest of the day. You can resume normal activity the day after the procedure however YOU SHOULD NOT DRIVE, use power tools, machinery or perform tasks that involve climbing or major physical exertion for 24 hours (because of the sedation medicines used during the test).   SYMPTOMS TO REPORT IMMEDIATELY: A gastroenterologist can be reached at any hour. Please call [phone]  for any of the following symptoms:  Following lower endoscopy (colonoscopy, flexible sigmoidoscopy) Excessive amounts of blood in the stool  Significant tenderness, worsening of abdominal pains  Swelling of the abdomen that is new, acute  Fever of 100 or  higher  Following upper endoscopy (EGD, EUS, ERCP, esophageal dilation) Vomiting of blood or coffee ground material  New, significant abdominal pain  New, significant chest pain or pain under the shoulder blades  Painful or persistently difficult swallowing  New shortness of breath  Black, tarry-looking or red, bloody stools  FOLLOW UP:  If any biopsies were taken you will be contacted by phone or by letter within the next 1-3 weeks. Call [phone]  if you have not heard about the biopsies in 3 weeks.  Please also call with any specific questions about appointments or follow up tests.    Soft-Food Eating Plan A soft-food eating plan includes foods that are safe and easy to chew and swallow. Your health care provider or dietitian can help you find foods and flavors that fit into this plan. Follow this plan until your health care provider or dietitian says it is safe to start eating other foods and food textures. What are tips for following this plan? General guidelines   Take small bites of food, or cut food into pieces about  inch or smaller. Bite-sized pieces of food are easier to chew and swallow.  Eat moist foods. Avoid overly dry foods.  Avoid foods that: ? Are difficult to swallow, such as dry, chunky, crispy, or sticky foods. ? Are difficult to chew, such as hard, tough, or stringy foods. ? Contain nuts, seeds, or fruits.  Follow instructions from your dietitian about the types of liquids that are safe for you to swallow. You may be  allowed to have: ? Thick liquids only. This includes only liquids that are thicker than honey. ? Thin and thick liquids. This includes all beverages and foods that become liquid at room temperature.  To make thick liquids: ? Purchase a commercial liquid thickening powder. These are available at grocery stores and pharmacies. ? Mix the thickener into liquids according to instructions on the label. ? Purchase ready-made thickened  liquids. ? Thicken soup by pureeing, straining to remove chunks, and adding flour, potato flakes, or corn starch. ? Add commercial thickener to foods that become liquid at room temperature, such as milk shakes, yogurt, ice cream, gelatin, and sherbet.  Ask your health care provider whether you need to take a fiber supplement. Cooking  Cook meats so they stay tender and moist. Use methods like braising, stewing, or baking in liquid.  Cook vegetables and fruit until they are soft enough to be mashed with a fork.  Peel soft, fresh fruits such as peaches, nectarines, and melons.  When making soup, make sure chunks of meat and vegetables are smaller than  inch.  Reheat leftover foods slowly so that a tough crust does not form. What foods are allowed? The items listed below may not be a complete list. Talk with your dietitian about what dietary choices are best for you. Grains Breads, muffins, pancakes, or waffles moistened with syrup, jelly, or butter. Dry cereals well-moistened with milk. Moist, cooked cereals. Well-cooked pasta and rice. Vegetables All soft-cooked vegetables. Shredded lettuce. Fruits All canned and cooked fruits. Soft, peeled fresh fruits. Strawberries. Dairy Milk. Cream. Yogurt. Cottage cheese. Soft cheese without the rind. Meats and other protein foods Tender, moist ground meat, poultry, or fish. Meat cooked in gravy or sauces. Eggs. Sweets and desserts Ice cream. Milk shakes. Sherbet. Pudding. Fats and oils Butter. Margarine. Olive, canola, sunflower, and grapeseed oil. Smooth salad dressing. Smooth cream cheese. Mayonnaise. Gravy. What foods are not allowed? The items listed bemay not be a complete list. Talk with your dietitian about what dietary choices are best for you. Grains Coarse or dry cereals, such as bran, granola, and shredded wheat. Tough or chewy crusty breads, such as [place] bread or baguettes. Breads with nuts, seeds, or fruit. Vegetables All raw  vegetables. Cooked corn. Cooked vegetables that are tough or stringy. Tough, crisp, fried potatoes and potato skins. Fruits Fresh fruits with skins or seeds, or both, such as apples, pears, and grapes. Stringy, high-pulp fruits, such as papaya, pineapple, coconut, and mango. Fruit leather and all dried fruit. Dairy Yogurt with nuts or coconut. Meats and other protein foods Hard, dry sausages. Dry meat, poultry, or fish. Meats with gristle. Fish with bones. Fried meat or fish. Lunch meat and hotdogs. Nuts and seeds. Chunky peanut butter or other nut butters. Sweets and desserts Cakes or cookies that are very dry or chewy. Desserts with dried fruit, nuts, or coconut. Fried pastries. Very rich pastries. Fats and oils Cream cheese with fruit or nuts. Salad dressings with seeds or chunks. Summary  A soft-food eating plan includes foods that are safe and easy to swallow. Generally, the foods should be soft enough to be mashed with a fork.  Avoid foods that are dry, hard to chew, crunchy, sticky, stringy, or crispy.  Ask your health care provider whether you need to thicken your liquids and if you need to take a fiber supplement. This information is not intended to replace advice given to you by your health care provider. Make sure you discuss any questions you  have with your health care provider. Document Revised: 08/13/2018 Document Reviewed: 06/25/2016 Elsevier Patient Education  [place].

## 2019-10-28 NOTE — Anesthesia Procedure Notes (Signed)
Procedure Name: MAC Date/Time: 10/28/2019 1:33 PM Performed by: Lissa Morales, CRNA Pre-anesthesia Checklist: Patient identified, Suction available, Emergency Drugs available, Patient being monitored and Timeout performed Patient Re-evaluated:Patient Re-evaluated prior to induction Oxygen Delivery Method: Simple face mask Placement Confirmation: positive ETCO2

## 2019-10-28 NOTE — Op Note (Signed)
[facility] Patient Name: Ashley Rivera Procedure Date: 10/28/2019 MRN: [id number] Attending MD: Mauri Pole , MD Date of Birth: [birth year] CSN: [id number] Age: 79 Admit Type: Outpatient Procedure:                Colonoscopy Indications:              Excision of colonic polyp Providers:                Mauri Pole, MD, Glori Bickers, RN, Clyde Lundborg, RN, Cherylynn Ridges, Technician, Enrigue Catena, CRNA Referring MD:              Medicines:                Monitored Anesthesia Care Complications:            No immediate complications. Estimated Blood Loss:     Estimated blood loss was minimal. Procedure:                Pre-Anesthesia Assessment:                           - Prior to the procedure, a History and Physical                            was performed, and patient medications and                            allergies were reviewed. The patient's tolerance of                            previous anesthesia was also reviewed. The risks                            and benefits of the procedure and the sedation                            options and risks were discussed with the patient.                            All questions were answered, and informed consent                            was obtained. Prior Anticoagulants: The patient has                            taken no previous anticoagulant or antiplatelet                            agents. ASA Grade Assessment: III - A patient with  severe systemic disease. After reviewing the risks                            and benefits, the patient was deemed in                            satisfactory condition to undergo the procedure.                           After obtaining informed consent, the colonoscope                            was passed under direct vision. Throughout the                            procedure, the patient's  blood pressure, pulse, and                            oxygen saturations were monitored continuously. The                            [id number]-H190DL ([id number] Olympus pediatric colonscope                            was introduced through the anus and advanced to the                            the cecum, identified by appendiceal orifice and                            ileocecal valve. The colonoscopy was performed                            without difficulty. The patient tolerated the                            procedure well. The quality of the bowel                            preparation was adequate. The ileocecal valve,                            appendiceal orifice, and rectum were photographed. Scope In: 1:39:25 PM Scope Out: 2:48:03 PM Scope Withdrawal Time: 1 hour 4 minutes 41 seconds  Total Procedure Duration: 1 hour 8 minutes 38 seconds  Findings:      The perianal and digital rectal examinations were normal.      A 20 mm polyp was found in the cecum. The polyp was multi-lobulated.       Preparations were made for mucosal resection. Chromoscopy with methylene       blue was done to mark the borders of the lesion. Orise gel was injected       around the edge of the polyp to raise the lesion. Adequate lift noted of       the polyp. Piecemeal  mucosal resection using a hot snare was performed.       Resection and retrieval were complete. To prevent bleeding after mucosal       resection, four hemostatic clips were successfully placed (MR       conditional). There was no bleeding at the end of the procedure. Distal       fold area in ascending colon was tattooed with an injection of 1 mL of       Spot (carbon black).      Multiple small and large-mouthed diverticula were found in the sigmoid       colon, descending colon, transverse colon and ascending colon.      Non-bleeding internal hemorrhoids were found during retroflexion. The       hemorrhoids were small.      The exam was  otherwise without abnormality. Impression:               - One 20 mm polyp in the cecum, removed with                            mucosal resection. Resected and retrieved. Clips                            (MR conditional) were placed.                           - Severe diverticulosis in the sigmoid colon, in                            the descending colon, in the transverse colon and                            in the ascending colon.                           - Non-bleeding internal hemorrhoids.                           - The examination was otherwise normal.                           - Mucosal resection was performed. Resection and                            retrieval were complete. Moderate Sedation:      Not Applicable - Patient had care per Anesthesia. Recommendation:           - Patient has a contact number available for                            emergencies. The signs and symptoms of potential                            delayed complications were discussed with the                            patient. Return to normal activities tomorrow.  Written discharge instructions were provided to the                            patient.                           - Resume previous diet.                           - Continue present medications.                           - Await pathology results.                           - Repeat colonoscopy date to be determined after                            pending pathology results are reviewed for                            surveillance based on pathology results.                           - Return to GI clinic at the next available                            appointment. Procedure Code(s):        --- Professional ---                           [phone], Colonoscopy, flexible; with endoscopic                            mucosal resection Diagnosis Code(s):        --- Professional ---                           K63.5, Polyp of  colon                           K64.8, Other hemorrhoids                           K57.30, Diverticulosis of large intestine without                            perforation or abscess without bleeding CPT copyright 2019 American Medical Association. All rights reserved. The codes documented in this report are preliminary and upon coder review may  be revised to meet current compliance requirements. Mauri Pole, MD 10/28/2019 3:24:29 PM This report has been signed electronically. Number of Addenda: 0

## 2019-10-28 NOTE — Anesthesia Preprocedure Evaluation (Addendum)
Anesthesia Evaluation  Patient identified by MRN, date of birth, ID band Patient awake    Reviewed: Allergy & Precautions, NPO status , Patient's Chart, lab work & pertinent test results  History of Anesthesia Complications Negative for: history of anesthetic complications  Airway Mallampati: II  TM Distance: >3 FB Neck ROM: Full    Dental  (+) Dental Advisory Given   Pulmonary asthma , COPD,  COPD inhaler, former smoker,    Pulmonary exam normal        Cardiovascular negative cardio ROS Normal cardiovascular exam     Neuro/Psych negative neurological ROS  negative psych ROS   GI/Hepatic Neg liver ROS, GERD  Controlled,  Endo/Other  negative endocrine ROS  Renal/GU negative Renal ROS  Female GU complaint     Musculoskeletal negative musculoskeletal ROS (+)   Abdominal   Peds  Hematology negative hematology ROS (+)   Anesthesia Other Findings Covid test negative   Reproductive/Obstetrics                            Anesthesia Physical Anesthesia Plan  ASA: III  Anesthesia Plan: MAC   Post-op Pain Management:    Induction: Intravenous  PONV Risk Score and Plan: 2 and Propofol infusion and Treatment may vary due to age or medical condition  Airway Management Planned: Nasal Cannula and Natural Airway  Additional Equipment: None  Intra-op Plan:   Post-operative Plan:   Informed Consent: I have reviewed the patients History and Physical, chart, labs and discussed the procedure including the risks, benefits and alternatives for the proposed anesthesia with the patient or authorized representative who has indicated his/her understanding and acceptance.       Plan Discussed with: CRNA and Anesthesiologist  Anesthesia Plan Comments:        Anesthesia Quick Evaluation

## 2019-10-28 NOTE — H&P (Signed)
[facility] History and Physical   Primary Care Physician:  Michael Boston, MD   Reason for Procedure:   Large cecal polyp Plan:    Colonoscopy with polypectomy, EMR     HPI: Ashley Rivera is a 79 y.o. female with large cecal polyp is here for colonoscopy with polypectomy/EMR The risks and benefits as well as alternatives of endoscopic procedure(s) have been discussed and reviewed. All questions answered. The patient agrees to proceed.    Past Medical History:  Diagnosis Date  . Allergy   . Asthma    bronchial asthma  . Cataract    left eye  . COPD (chronic obstructive pulmonary disease) ([place])   . Diverticulosis   . Emphysema of lung ([place])   . Genital warts    Dx about 30 years ago   . GERD (gastroesophageal reflux disease)   . Hemorrhoids   . Hx of colonic polyp   . Hx: UTI (urinary tract infection)   . Kidney stone   . Osteoporosis   . Proctitis 11-12-11  . Urinary incontinence    On occasion    Past Surgical History:  Procedure Laterality Date  . COLONOSCOPY    . DILATION AND CURETTAGE OF UTERUS     50 years ago. Removed rest of placenta from birth  . INNER EAR SURGERY Right 2006   Tumor removed   . TONSILLECTOMY AND ADENOIDECTOMY  1950    Prior to Admission medications   Medication Sig Start Date End Date Taking? Authorizing Provider  B Complex Vitamins (VITAMIN-B COMPLEX PO) Take 1 tablet by mouth daily.   Yes [provider]  budesonide-formoterol (SYMBICORT) 160-4.5 MCG/ACT inhaler Inhale 2 puffs into the lungs daily as needed (shortness of breath).    Yes [provider]  calcium carbonate (TUMS - DOSED IN MG ELEMENTAL CALCIUM) 500 MG chewable tablet Chew 1 tablet by mouth as needed for indigestion or heartburn.   Yes [provider]  carboxymethylcellulose (REFRESH PLUS) 0.5 % SOLN Place 2 drops into both eyes 2 (two) times daily as needed.   Yes [provider]  cetirizine (ZYRTEC) 10 MG tablet Take 10 mg  by mouth daily as needed for allergies.    Yes [provider]  fish oil-omega-3 fatty acids 1000 MG capsule Take 1 g by mouth 2 (two) times daily.    Yes [provider]  folic acid (FOLVITE) 161 MCG tablet Take 400 mcg by mouth daily.    Yes [provider]  Melatonin 5 MG TABS Take 5 mg by mouth at bedtime.    Yes [provider]  Probiotic Product (ALIGN) 4 MG CAPS Take 4 mg by mouth daily.   Yes [provider]  sulfaSALAzine (AZULFIDINE) 500 MG tablet Take 2 tablets (1,000 mg total) by mouth 2 (two) times daily. 08/24/19  Yes Deanna Wiater, Venia Minks, MD  VENTOLIN HFA 108 (90 BASE) MCG/ACT inhaler Inhale 1 puff into the lungs as needed for wheezing or shortness of breath.  11/04/14  Yes [provider]  Vitamin D, Cholecalciferol, 50 MCG (2000 UT) CAPS Take 2,000 mg by mouth in the morning and at bedtime.    Yes [provider]  Wheat Dextrin (BENEFIBER PO) Take 1 Dose by mouth in the morning and at bedtime.   Yes [provider]  mesalamine (CANASA) 1000 MG suppository Place 1 suppository (1,000 mg total) rectally at bedtime. Patient not taking: Reported on 10/19/2019 08/03/19   Mauri Pole, MD  tiotropium (SPIRIVA) 18 MCG inhalation capsule Place 18 mcg into inhaler and inhale daily as needed (asthma).     [provider]    Current Facility-Administered Medications  Medication Dose Route Frequency Provider Last Rate Last Admin  . 0.9 %  sodium chloride infusion   Intravenous Continuous Captain Blucher V, MD      . lactated ringers infusion   Intravenous Continuous Taner Rzepka V, MD      . lactated ringers infusion    Continuous PRN Mauri Pole, MD 10 mL/hr at 10/28/19 1322 Continued from Pre-op at 10/28/19 1322    Allergies as of 09/27/2019  . (No Known Allergies)    Family History  Problem Relation Age of Onset  . Lung cancer Mother   . Colon cancer Paternal Grandmother   . Breast  cancer Maternal Aunt   . Esophageal cancer Neg Hx   . Rectal cancer Neg Hx   . Stomach cancer Neg Hx     Social History   Socioeconomic History  . Marital status: Married    Spouse name: Not on file  . Number of children: 2  . Years of education: Not on file  . Highest education level: Not on file  Occupational History  . Occupation: Retired   Tobacco Use  . Smoking status: Former Smoker    Types: Cigarettes  . Smokeless tobacco: Never Used  Vaping Use  . Vaping Use: Never used  Substance and Sexual Activity  . Alcohol use: Yes    Alcohol/week: 2.0 standard drinks    Types: 2 Glasses of wine per week  . Drug use: No  . Sexual activity: Yes    Partners: Male    Birth control/protection: Post-menopausal  Other Topics Concern  . Not on file  Social History Narrative   Daily caffeine    Social Determinants of Health   Financial Resource Strain:   . Difficulty of Paying Living Expenses:   Food Insecurity:   . Worried About Charity fundraiser in the Last Year:   . Arboriculturist in the Last Year:   Transportation Needs:   . Film/video editor (Medical):   Marland Kitchen Lack of Transportation (Non-Medical):   Physical Activity:   . Days of Exercise per Week:   . Minutes of Exercise per Session:   Stress:   . Feeling of Stress :   Social Connections:   . Frequency of Communication with Friends and Family:   . Frequency of Social Gatherings with Friends and Family:   . Attends Religious Services:   . Active Member of Clubs or Organizations:   . Attends Archivist Meetings:   Marland Kitchen Marital Status:   Intimate Partner Violence:   . Fear of Current or Ex-Partner:   . Emotionally Abused:   Marland Kitchen Physically Abused:   . Sexually Abused:     Review of Systems:  All other review of systems negative except as mentioned in the HPI.  Physical Exam: Vital signs in last 24 hours: Temp:  [98 F (36.7 C)] 98 F (36.7 C) (06/24 1215) Pulse Rate:  [96] 96 (06/24 1215) Resp:   [20] 20 (06/24 1215) BP: (166-189)/(73-88) 166/73 (06/24 1232) SpO2:  [96 %] 96 % (06/24 1215) Weight:  [65.3 kg] 65.3 kg (06/24 1215)   General:   Alert,  Well-developed, well-nourished, pleasant and cooperative in NAD Lungs:  Clear throughout to auscultation.   Heart:  Regular rate and rhythm; no murmurs, clicks, rubs,  or  gallops. Abdomen:  Soft, nontender and nondistended. Normal bowel sounds.   Neuro/Psych:  Alert and cooperative. Normal mood and affect. A and O x 3   K. Denzil Magnuson , MD [phone]

## 2019-10-28 NOTE — Transfer of Care (Signed)
Immediate Anesthesia Transfer of Care Note  Patient: Ashley Rivera  Procedure(s) Performed: COLONOSCOPY WITH PROPOFOL  LARGE CECAL POLYP (N/A ) ENDOSCOPIC MUCOSAL RESECTION (N/A ) POLYPECTOMY SUBMUCOSAL TATTOO INJECTION  Patient Location: PACU  Anesthesia Type:MAC  Level of Consciousness: awake, alert  and patient cooperative  Airway & Oxygen Therapy: Patient Spontanous Breathing and Patient connected to face mask oxygen  Post-op Assessment: Report given to RN, Post -op Vital signs reviewed and stable and Patient moving all extremities X 4  Post vital signs: stable  Last Vitals:  Vitals Value Taken Time  BP 164/69 10/28/19 1500  Temp    Pulse 81 10/28/19 1501  Resp 23 10/28/19 1502  SpO2 100 % 10/28/19 1501  Vitals shown include unvalidated device data.  Last Pain:  Vitals:   10/28/19 1215  TempSrc: Oral  PainSc: 0-No pain         Complications: No complications documented.

## 2019-10-28 NOTE — Anesthesia Postprocedure Evaluation (Signed)
Anesthesia Post Note  Patient: NANCYE GRUMBINE  Procedure(s) Performed: COLONOSCOPY WITH PROPOFOL  LARGE CECAL POLYP (N/A ) ENDOSCOPIC MUCOSAL RESECTION (N/A ) POLYPECTOMY SUBMUCOSAL TATTOO INJECTION SUBMUCOSAL LIFTING INJECTION HEMOSTASIS CLIP PLACEMENT     Patient location during evaluation: PACU Anesthesia Type: MAC Level of consciousness: awake and alert Pain management: pain level controlled Vital Signs Assessment: post-procedure vital signs reviewed and stable Respiratory status: spontaneous breathing, nonlabored ventilation and respiratory function stable Cardiovascular status: stable and blood pressure returned to baseline Anesthetic complications: no   No complications documented.  Last Vitals:  Vitals:   10/28/19 1509 10/28/19 1516  BP: (!) 162/64 126/81  Pulse: 80   Resp: 17   Temp:    SpO2: 96% 93%    Last Pain:  Vitals:   10/28/19 1516  TempSrc:   PainSc: 0-No pain                 Audry Pili

## 2019-10-29 ENCOUNTER — Encounter (HOSPITAL_COMMUNITY): Payer: Self-pay | Admitting: Gastroenterology

## 2019-10-29 LAB — SURGICAL PATHOLOGY

## 2019-11-04 ENCOUNTER — Other Ambulatory Visit: Payer: Self-pay

## 2019-11-04 ENCOUNTER — Encounter (HOSPITAL_COMMUNITY): Payer: Self-pay

## 2019-11-04 ENCOUNTER — Encounter (HOSPITAL_COMMUNITY)
Admission: RE | Admit: 2019-11-04 | Discharge: 2019-11-04 | Disposition: A | Discharge status: Home / Self Care | Payer: PPO | Source: Ambulatory Visit | Attending: Urology | Admitting: Urology

## 2019-11-04 DIAGNOSIS — Z01812 Encounter for preprocedural laboratory examination: Secondary | ICD-10-CM | POA: Diagnosis not present

## 2019-11-04 HISTORY — DX: Personal history of urinary calculi: Z87.442

## 2019-11-04 HISTORY — DX: Unspecified malignant neoplasm of skin, unspecified: C44.90

## 2019-11-04 HISTORY — DX: Personal history of other diseases of the digestive system: Z87.19

## 2019-11-04 HISTORY — DX: Pneumonia, unspecified organism: J18.9

## 2019-11-04 HISTORY — DX: Other complications of anesthesia, initial encounter: T88.59XA

## 2019-11-04 LAB — BASIC METABOLIC PANEL
Anion gap: 8 (ref 5–15)
BUN: 13 mg/dL (ref 8–23)
CO2: 30 mmol/L (ref 22–32)
Calcium: 9.4 mg/dL (ref 8.9–10.3)
Chloride: 103 mmol/L (ref 98–111)
Creatinine, Ser: 0.71 mg/dL (ref 0.44–1.00)
GFR calc Af Amer: >60 mL/min (ref >60)
GFR calc non Af Amer: >60 mL/min (ref >60)
Glucose, Bld: 100 mg/dL — ABNORMAL HIGH (ref 70–99)
Potassium: 4.6 mmol/L (ref 3.5–5.1)
Sodium: 141 mmol/L (ref 135–145)

## 2019-11-04 LAB — CBC
HCT: 41 % (ref 36.0–46.0)
Hemoglobin: 13 g/dL (ref 12.0–15.0)
MCH: 29.3 pg (ref 26.0–34.0)
MCHC: 31.7 g/dL (ref 30.0–36.0)
MCV: 92.6 fL (ref 80.0–100.0)
Platelets: 154 10*3/uL (ref 150–400)
RBC: 4.43 MIL/uL (ref 3.87–5.11)
RDW: 13.6 % (ref 11.5–15.5)
WBC: 6 10*3/uL (ref 4.0–10.5)
nRBC: 0 % (ref 0.0–0.2)

## 2019-11-04 NOTE — Progress Notes (Signed)
PCP - Dr. Jacalyn Lefevre  Cardiologist - no  PPM/ICD -  Device Orders -  Rep Notified -   Chest x-ray -  EKG -  Stress Test -  ECHO -  Cardiac Cath -   Sleep Study -  CPAP -   Fasting Blood Sugar -  Checks Blood Sugar _____ times a day  Blood Thinner Instructions: Aspirin Instructions:  ERAS Protcol - PRE-SURGERY   COVID TEST- COPD, asthma  Activity- can walk up flight of stairs without SOB walks around the block and does housework  Anesthesia review:   Patient denies shortness of breath, fever, cough and chest pain at PAT appointment   All instructions explained to the patient, with a verbal understanding of the material. Patient agrees to go over the instructions while at home for a better understanding. Patient also instructed to self quarantine after being tested for COVID-19. The opportunity to ask questions was provided.

## 2019-11-05 ENCOUNTER — Other Ambulatory Visit (HOSPITAL_COMMUNITY)
Admission: RE | Admit: 2019-11-05 | Discharge: 2019-11-05 | Disposition: A | Discharge status: Home / Self Care | Payer: PPO | Source: Ambulatory Visit | Attending: Urology | Admitting: Urology

## 2019-11-05 DIAGNOSIS — Z20822 Contact with and (suspected) exposure to covid-19: Secondary | ICD-10-CM | POA: Insufficient documentation

## 2019-11-05 DIAGNOSIS — Z01812 Encounter for preprocedural laboratory examination: Secondary | ICD-10-CM | POA: Insufficient documentation

## 2019-11-05 LAB — SARS CORONAVIRUS 2 (TAT 6-24 HRS): SARS Coronavirus 2: NEGATIVE

## 2019-11-06 NOTE — Anesthesia Preprocedure Evaluation (Addendum)
Anesthesia Evaluation  Patient identified by MRN, date of birth, ID band Patient awake    Reviewed: Allergy & Precautions, NPO status , Patient's Chart, lab work & pertinent test results  Airway Mallampati: II  TM Distance: >3 FB Neck ROM: Full    Dental no notable dental hx. (+) Teeth Intact, Dental Advisory Given   Pulmonary asthma , COPD,  COPD inhaler, former smoker,    Pulmonary exam normal breath sounds clear to auscultation       Cardiovascular Exercise Tolerance: Good negative cardio ROS Normal cardiovascular exam Rhythm:Regular Rate:Normal     Neuro/Psych negative neurological ROS  negative psych ROS   GI/Hepatic Neg liver ROS, GERD  Medicated and Controlled,  Endo/Other  negative endocrine ROS  Renal/GU negative Renal ROS  Female GU complaint     Musculoskeletal negative musculoskeletal ROS (+)   Abdominal   Peds  Hematology Lab Results      Component                Value               Date                      WBC                      6.0                 11/04/2019                HGB                      13.0                11/04/2019                HCT                      41.0                11/04/2019                MCV                      92.6                11/04/2019                PLT                      154                 11/04/2019              Anesthesia Other Findings   Reproductive/Obstetrics                            Anesthesia Physical Anesthesia Plan  ASA: III  Anesthesia Plan: General   Post-op Pain Management:    Induction: Intravenous  PONV Risk Score and Plan: 4 or greater and Treatment may vary due to age or medical condition, Ondansetron and Dexamethasone  Airway Management Planned: LMA  Additional Equipment:   Intra-op Plan:   Post-operative Plan:   Informed Consent: I have reviewed the patients History and Physical, chart, labs and  discussed the procedure including the risks, benefits and alternatives  for the proposed anesthesia with the patient or authorized representative who has indicated his/her understanding and acceptance.     Dental advisory given  Plan Discussed with:   Anesthesia Plan Comments:        Anesthesia Quick Evaluation

## 2019-11-07 NOTE — H&P (Signed)
Office Visit Report     10/18/2019   --------------------------------------------------------------------------------   Ashley Rivera  MRN: [id number]  DOB: [birth year], 79 year old Female  SSN: [id number]   PRIMARY CARE:  Haywood Pao, MD  REFERRING:  Haywood Pao, MD  PROVIDER:  Festus Aloe, M.D.  LOCATION:  [facility] [phone]     --------------------------------------------------------------------------------   CC: I have kidney stones.  HPI: Ashley Rivera is a 79 year-old female established patient who is here for renal calculi.  The problem is on the right side. This is not her first kidney stone. She is currently having back pain. She denies having flank pain, groin pain, nausea, vomiting, fever, and chills.   She underwent CT scan February 15, 2018 which showed a 9 mm right midpole and 12 mm right lower pole stone. She also had what looks like a right UPJ obstruction which was similar to ultrasound in 2017 and slightly more progression compared to CT in 2008.   She doesn't recall a history of stones. She had some mild right flank pain on occasion. No gross hematuria. KUB with two stones in right kidney. RLP largest at 11 mm. Seen 04/21 and right LP stone in renal pelvis and about 18 mm. [place] with right hydro, but that is chronic (rt UPJ). KUB today again with 17 mm stone renal pelvis. No significant flank pain. No gross hematuria. She asked about "kidney stone". Cr 0.7.     ALLERGIES: No Allergies    MEDICATIONS: B-12 TABS Oral  Cetirizine Hcl 10 mg tablet  Fish Oil  Melatonin 5 mg tablet  Probiotic CAPS Oral  Spiriva 18 mcg capsule, with inhalation device  Sulfasalazine 500 mg tablet  Symbicort 160 mcg-4.5 mcg/actuation hfa aerosol with adapter  Ventolin Hfa 90 mcg hfa aerosol with adapter  Vitamin D3 50 mcg (2,000 unit) tablet     GU PSH: Cystoscopy - 03/18/2018       PSH Notes: Aural Glomus Tumor Removal, Ear Surgery   NON-GU  PSH: Cataract surgery Remove Tonsils And Adenoids     GU PMH: Hydronephrosis - 03/18/2018 Renal calculus - 03/18/2018, - 02/18/2018 Urinary Retention, Unspec - 03/18/2018 Urinary Urgency - 03/18/2018 Acute Cystitis/UTI - 02/18/2018 Urinary Retention, drug induced - 02/18/2018 Chronic cystitis (w/o hematuria), Chronic cystitis - 2015 Nocturia, Nocturia - 2015 Dysuria, Dysuria - 2014 Gross hematuria, Gross hematuria - 2014 Other microscopic hematuria, Microscopic hematuria - 2014    NON-GU PMH: Encounter for general adult medical examination without abnormal findings, Encounter for preventive health examination - 2015 Asthma, Asthma - 2014 Benign neoplasm of meninges, unspecified, Meningioma - 2014 COPD, Chronic Obstructive Pulmonary Disease - 2014 Noninfective gastroenteritis and colitis, unspecified, Colitis - 2014 Personal history of other diseases of the digestive system, History of esophageal reflux - 2014 Arthritis GERD    FAMILY HISTORY: cardiac disorder - Father Death of family member - Father, Mother Lung trouble - Mother nephrolithiasis - Sister Prostate Cancer - Father Urologic Disorder - Sister    Notes: 2 sons   SOCIAL HISTORY: Marital Status: Married Preferred Language: English; Ethnicity: Not Hispanic Or Latino; Race: White Current Smoking Status: Patient does not smoke anymore. Has not smoked since 02/04/2008. Smoked for 50 years. Smoked less than 1/2 pack per day.   Tobacco Use Assessment Completed: Used Tobacco in last 30 days? Does not use smokeless tobacco. Drinks 2 drinks per week.  Drinks 2 caffeinated drinks per day. Patient's occupation is/was retired.     Notes:  Death In The Family Father, Death In The Family Mother, Occupation:, Tobacco Use, Caffeine Use, Marital History - Currently Married, Alcohol Use   REVIEW OF SYSTEMS:    GU Review Female:   Patient reports hard to postpone urination, get up at night to urinate, and leakage of urine.  Patient denies frequent urination, burning /pain with urination, stream starts and stops, trouble starting your stream, have to strain to urinate, and being pregnant.  Gastrointestinal (Upper):   Patient denies nausea, vomiting, and indigestion/ heartburn.  Gastrointestinal (Lower):   Patient denies diarrhea and constipation.  Constitutional:   Patient denies night sweats, fever, weight loss, and fatigue.  Skin:   Patient denies skin rash/ lesion and itching.  Eyes:   Patient denies blurred vision and double vision.  Ears/ Nose/ Throat:   Patient denies sore throat and sinus problems.  Hematologic/Lymphatic:   Patient denies swollen glands and easy bruising.  Cardiovascular:   Patient denies leg swelling and chest pains.  Respiratory:   Patient denies cough and shortness of breath.  Endocrine:   Patient denies excessive thirst.  Musculoskeletal:   Patient denies back pain and joint pain.  Neurological:   Patient denies headaches and dizziness.  Psychologic:   Patient denies depression and anxiety.   VITAL SIGNS:      10/18/2019 03:49 PM  Weight 142 lb / 64.41 kg  Height 62 in / 157.48 cm  BP 145/96 mmHg  Pulse 111 /min  Temperature 97.7 F / 36.5 C  BMI 26.0 kg/m   MULTI-SYSTEM PHYSICAL EXAMINATION:    Constitutional: Well-nourished. No physical deformities. Normally developed. Good grooming.  Neck: Neck symmetrical, not swollen. Normal tracheal position.  Respiratory: No labored breathing, no use of accessory muscles.   Cardiovascular: Normal temperature, normal extremity pulses, no swelling, no varicosities.  Neurologic / Psychiatric: Oriented to time, oriented to place, oriented to person. No depression, no anxiety, no agitation.  Gastrointestinal: No mass, no tenderness, no rigidity, non obese abdomen.     Complexity of Data:  X-Ray Review: KUB: Reviewed Films. 04/21 Renal Ultrasound: Reviewed Films. 04/21    PROCEDURES:         KUB - 74018  A single view of the abdomen is  obtained.  Calculi:  17 mm right uPJ, 10 mm right MP       The bones appeared normal. The bowel gas pattern appeared normal. The soft tissues were unremarkable.   Patient confirmed No Neulasta OnPro Device.           Urinalysis w/Scope - 81001 Dipstick Dipstick Cont'd Micro  Color: Yellow Bilirubin: Neg mg/dL WBC/hpf: 0 - 5/hpf  Appearance: Clear Ketones: Neg mg/dL RBC/hpf: 10 - 20/hpf  Specific Gravity: 1.020 Blood: 3+ ery/uL Bacteria: Few (10-25/hpf)  pH: 5.5 Protein: 3+ mg/dL Cystals: Ca Oxalate  Glucose: Neg mg/dL Urobilinogen: 0.2 mg/dL Casts: NS (Not Seen)    Nitrites: Neg Trichomonas: Not Present    Leukocyte Esterase: Neg leu/uL Mucous: Present      Epithelial Cells: 0 - 5/hpf      Yeast: NS (Not Seen)      Sperm: Not Present    Notes: qns to spin    ASSESSMENT:      ICD-10 Details  1 GU:   Renal calculus - N20.0 Chronic, Stable - I drew her a picture the anatomy. We discussed the nature risks and benefits of continued surveillance, shockwave lithotripsy, ureteroscopy and PCNL. She does not want anything invasive, therefore will proceed with ureteroscopy. We discuss  she may need a staged procedure. She also asked about repair the UPJ obstruction. Overall I do not think it is clinically significant. We can see out looks well were up there. We discussed UPJ correction is a little bit more involved with either laser incision or a pyeloplasty. We will focus on the stones.  2   Hydronephrosis - N13.0 Chronic, Stable   PLAN:           Orders Labs Urine Culture          Schedule Return Visit/Planned Activity: Next Available Appointment - Schedule Surgery          Document Letter(s):  Created for Patient: Clinical Summary         Notes:   cc: Dr. Osborne Casco Urinary retention-resolved. Occasional urgency. We will monitor.   Microscopic hematuria-benign evaluation   Kidney stones-we discussed the nature risks and benefits of surveillance for ureteroscopy with laser  lithotripsy. All questions answered. She'll continue surveillance. We'll check a KUB in 6 months with a right renal ultrasound given the right UPJ.    * Signed by Festus Aloe, M.D. on 10/19/19 at 1:19 PM (EDT)*     The information contained in this medical record document is considered private and confidential patient information. This information can only be used for the medical diagnosis and/or medical services that are being provided by the patient's selected caregivers. This information can only be distributed outside of the patient's care if the patient agrees and signs waivers of authorization for this information to be sent to an outside source or route.  Add; cx ordered, not sent.

## 2019-11-09 ENCOUNTER — Ambulatory Visit (HOSPITAL_COMMUNITY): Payer: PPO | Admitting: Anesthesiology

## 2019-11-09 ENCOUNTER — Ambulatory Visit (HOSPITAL_COMMUNITY)
Admission: RE | Admit: 2019-11-09 | Discharge: 2019-11-09 | Disposition: A | Discharge status: Home / Self Care | Payer: PPO | Attending: Urology | Admitting: Urology

## 2019-11-09 ENCOUNTER — Encounter (HOSPITAL_COMMUNITY)
Admission: RE | Disposition: A | Discharge status: Home / Self Care | Payer: Self-pay | Source: Home / Self Care | Attending: Urology

## 2019-11-09 ENCOUNTER — Ambulatory Visit (HOSPITAL_COMMUNITY): Payer: PPO

## 2019-11-09 ENCOUNTER — Encounter (HOSPITAL_COMMUNITY): Payer: Self-pay | Admitting: Urology

## 2019-11-09 ENCOUNTER — Other Ambulatory Visit: Payer: Self-pay

## 2019-11-09 DIAGNOSIS — K219 Gastro-esophageal reflux disease without esophagitis: Secondary | ICD-10-CM | POA: Insufficient documentation

## 2019-11-09 DIAGNOSIS — M1990 Unspecified osteoarthritis, unspecified site: Secondary | ICD-10-CM | POA: Insufficient documentation

## 2019-11-09 DIAGNOSIS — Z7951 Long term (current) use of inhaled steroids: Secondary | ICD-10-CM | POA: Insufficient documentation

## 2019-11-09 DIAGNOSIS — Z87891 Personal history of nicotine dependence: Secondary | ICD-10-CM | POA: Diagnosis not present

## 2019-11-09 DIAGNOSIS — J449 Chronic obstructive pulmonary disease, unspecified: Secondary | ICD-10-CM | POA: Insufficient documentation

## 2019-11-09 DIAGNOSIS — N132 Hydronephrosis with renal and ureteral calculous obstruction: Secondary | ICD-10-CM | POA: Insufficient documentation

## 2019-11-09 DIAGNOSIS — Z79899 Other long term (current) drug therapy: Secondary | ICD-10-CM | POA: Insufficient documentation

## 2019-11-09 DIAGNOSIS — N200 Calculus of kidney: Secondary | ICD-10-CM

## 2019-11-09 DIAGNOSIS — N135 Crossing vessel and stricture of ureter without hydronephrosis: Secondary | ICD-10-CM | POA: Diagnosis present

## 2019-11-09 DIAGNOSIS — K648 Other hemorrhoids: Secondary | ICD-10-CM | POA: Diagnosis not present

## 2019-11-09 HISTORY — PX: CYSTOSCOPY/URETEROSCOPY/HOLMIUM LASER/STENT PLACEMENT: SHX6546

## 2019-11-09 SURGERY — CYSTOSCOPY/URETEROSCOPY/HOLMIUM LASER/STENT PLACEMENT
Anesthesia: General | Laterality: Right

## 2019-11-09 MED ORDER — ACETAMINOPHEN 10 MG/ML IV SOLN: 1000.0000 mg | Freq: Once | INTRAVENOUS | Status: DC | PRN

## 2019-11-09 MED ORDER — LIDOCAINE HCL URETHRAL/MUCOSAL 2 % EX GEL
CUTANEOUS | Status: AC
  Filled 2019-11-09: qty 30

## 2019-11-09 MED ORDER — LIDOCAINE 2% (20 MG/ML) 5 ML SYRINGE
INTRAMUSCULAR | Status: DC | PRN
  Administered 2019-11-09: 100 mg via INTRAVENOUS

## 2019-11-09 MED ORDER — DEXAMETHASONE SODIUM PHOSPHATE 10 MG/ML IJ SOLN
INTRAMUSCULAR | Status: AC
  Filled 2019-11-09: qty 1

## 2019-11-09 MED ORDER — DEXAMETHASONE SODIUM PHOSPHATE 4 MG/ML IJ SOLN
INTRAMUSCULAR | Status: DC | PRN
  Administered 2019-11-09: 5 mg via INTRAVENOUS

## 2019-11-09 MED ORDER — PROPOFOL 10 MG/ML IV BOLUS
INTRAVENOUS | Status: DC | PRN
  Administered 2019-11-09: 130 mg via INTRAVENOUS

## 2019-11-09 MED ORDER — IOHEXOL 300 MG/ML  SOLN
INTRAMUSCULAR | Status: DC | PRN
  Administered 2019-11-09: 5 mL

## 2019-11-09 MED ORDER — ONDANSETRON HCL 4 MG/2ML IJ SOLN
INTRAMUSCULAR | Status: DC | PRN
  Administered 2019-11-09: 4 mg via INTRAVENOUS

## 2019-11-09 MED ORDER — NITROFURANTOIN MACROCRYSTAL 50 MG PO CAPS: 50.0000 mg | ORAL_CAPSULE | Freq: Every day | ORAL | 0 refills | Status: DC

## 2019-11-09 MED ORDER — PROPOFOL 10 MG/ML IV BOLUS
INTRAVENOUS | Status: AC
  Filled 2019-11-09: qty 20

## 2019-11-09 MED ORDER — FENTANYL CITRATE (PF) 100 MCG/2ML IJ SOLN
INTRAMUSCULAR | Status: AC
  Filled 2019-11-09: qty 2

## 2019-11-09 MED ORDER — SODIUM CHLORIDE 0.9 % IR SOLN
Status: DC | PRN
  Administered 2019-11-09: 6000 mL

## 2019-11-09 MED ORDER — ONDANSETRON HCL 4 MG/2ML IJ SOLN
INTRAMUSCULAR | Status: AC
  Filled 2019-11-09: qty 2

## 2019-11-09 MED ORDER — BELLADONNA ALKALOIDS-OPIUM 16.2-30 MG RE SUPP
RECTAL | Status: AC
  Filled 2019-11-09: qty 1

## 2019-11-09 MED ORDER — ORAL CARE MOUTH RINSE: 15.0000 mL | Freq: Once | OROMUCOSAL | Status: AC

## 2019-11-09 MED ORDER — LIDOCAINE 2% (20 MG/ML) 5 ML SYRINGE
INTRAMUSCULAR | Status: AC
  Filled 2019-11-09: qty 5

## 2019-11-09 MED ORDER — CHLORHEXIDINE GLUCONATE 0.12 % MT SOLN
15.0000 mL | Freq: Once | OROMUCOSAL | Status: AC
  Administered 2019-11-09: 15 mL via OROMUCOSAL

## 2019-11-09 MED ORDER — LACTATED RINGERS IV SOLN: INTRAVENOUS | Status: DC

## 2019-11-09 MED ORDER — FENTANYL CITRATE (PF) 100 MCG/2ML IJ SOLN
INTRAMUSCULAR | Status: DC | PRN
  Administered 2019-11-09: 50 ug via INTRAVENOUS
  Administered 2019-11-09: 25 ug via INTRAVENOUS
  Administered 2019-11-09: 50 ug via INTRAVENOUS
  Administered 2019-11-09 (×3): 25 ug via INTRAVENOUS

## 2019-11-09 MED ORDER — ONDANSETRON HCL 4 MG/2ML IJ SOLN: 4.0000 mg | Freq: Once | INTRAMUSCULAR | Status: DC | PRN

## 2019-11-09 MED ORDER — FENTANYL CITRATE (PF) 100 MCG/2ML IJ SOLN: 25.0000 ug | INTRAMUSCULAR | Status: DC | PRN

## 2019-11-09 MED ORDER — CEFAZOLIN SODIUM-DEXTROSE 2-4 GM/100ML-% IV SOLN
2.0000 g | Freq: Once | INTRAVENOUS | Status: AC
  Administered 2019-11-09: 2 g via INTRAVENOUS
  Filled 2019-11-09: qty 100

## 2019-11-09 SURGICAL SUPPLY — 20 items
BAG URO CATCHER STRL LF (MISCELLANEOUS) ×2 IMPLANT
BASKET ZERO TIP NITINOL 2.4FR (BASKET) ×2 IMPLANT
BSKT STON RTRVL ZERO TP 2.4FR (BASKET) ×1
CATH INTERMIT  6FR 70CM (CATHETERS) ×4 IMPLANT
CATH URET 5FR 28IN CONE TIP (BALLOONS) ×2
CATH URET 5FR 70CM CONE TIP (BALLOONS) ×1 IMPLANT
CLOTH BEACON ORANGE TIMEOUT ST (SAFETY) ×2 IMPLANT
FIBER LASER FLEXIVA 365 (UROLOGICAL SUPPLIES) ×2 IMPLANT
GLOVE BIO SURGEON STRL SZ7.5 (GLOVE) ×2 IMPLANT
GOWN STRL REUS W/TWL XL LVL3 (GOWN DISPOSABLE) ×2 IMPLANT
GUIDEWIRE ANG ZIPWIRE 038X150 (WIRE) ×1 IMPLANT
GUIDEWIRE STR DUAL SENSOR (WIRE) ×2 IMPLANT
KIT TURNOVER KIT A (KITS) IMPLANT
MANIFOLD NEPTUNE II (INSTRUMENTS) ×2 IMPLANT
PACK CYSTO (CUSTOM PROCEDURE TRAY) ×2 IMPLANT
SHEATH URETERAL 12FRX28CM (UROLOGICAL SUPPLIES) ×2 IMPLANT
SHEATH URETERAL 12FRX35CM (MISCELLANEOUS) ×2 IMPLANT
STENT URET 6FRX26 CONTOUR (STENTS) ×1 IMPLANT
TUBING CONNECTING 10 (TUBING) ×2 IMPLANT
TUBING UROLOGY SET (TUBING) ×2 IMPLANT

## 2019-11-09 NOTE — Anesthesia Procedure Notes (Signed)
Procedure Name: LMA Insertion Date/Time: 11/09/2019 2:10 PM Performed by: Claudia Desanctis, CRNA Pre-anesthesia Checklist: Emergency Drugs available, Patient identified, Suction available and Patient being monitored Patient Re-evaluated:Patient Re-evaluated prior to induction Oxygen Delivery Method: Circle system utilized Preoxygenation: Pre-oxygenation with 100% oxygen Induction Type: IV induction Ventilation: Mask ventilation without difficulty LMA: LMA with gastric port inserted LMA Size: 4.0 Number of attempts: 1 Placement Confirmation: positive ETCO2 and breath sounds checked- equal and bilateral Tube secured with: Tape Dental Injury: Teeth and Oropharynx as per pre-operative assessment

## 2019-11-09 NOTE — Transfer of Care (Signed)
Immediate Anesthesia Transfer of Care Note  Patient: Ashley Rivera  Procedure(s) Performed: CYSTOSCOPY/URETEROSCOPY/HOLMIUM LASER/STENT PLACEMENT (Right )  Patient Location: PACU  Anesthesia Type:General  Level of Consciousness: awake and patient cooperative  Airway & Oxygen Therapy: Patient Spontanous Breathing and Patient connected to face mask  Post-op Assessment: Report given to RN and Post -op Vital signs reviewed and stable  Post vital signs: Reviewed and stable  Last Vitals:  Vitals Value Taken Time  BP 185/108 11/09/19 1550  Temp    Pulse 96 11/09/19 1554  Resp 19 11/09/19 1554  SpO2 92 % 11/09/19 1554  Vitals shown include unvalidated device data.  Last Pain:  Vitals:   11/09/19 1224  TempSrc:   PainSc: 0-No pain         Complications: No complications documented.

## 2019-11-09 NOTE — Interval H&P Note (Signed)
History and Physical Interval Note:  11/09/2019 1:58 PM  Ashley Rivera  has presented today for surgery, with the diagnosis of RIGHT URETEROPELVIC JUNCTION OBSTRUCTION.  The various methods of treatment have been discussed with the patient and family. After consideration of risks, benefits and other options for treatment, the patient has consented to  Procedure(s): CYSTOSCOPY/URETEROSCOPY/HOLMIUM LASER/STENT PLACEMENT (Right) as a surgical intervention. Again discussed rt UPJ anatomy, large stone volume and limitations of endoscopy, likely staged procedure and stent. Also discussed again alternatives  - surveillance (stones are large and growing) and PCNL. She has no dysuria or fever. Doing well.  The patient's history has been reviewed, patient examined, no change in status, stable for surgery.  I have reviewed the patient's chart and labs.  Questions were answered to the patient's satisfaction.  She elects to proceed with URS/HLL/stent.   Festus Aloe

## 2019-11-09 NOTE — Discharge Instructions (Signed)
Ureteral Stent Implantation, Care After This sheet gives you information about how to care for yourself after your procedure. Your health care provider may also give you more specific instructions. If you have problems or questions, contact your health care provider.  Removal of the stent: Remove the stent by pulling the string on Monday morning, 11/15/2019 as instructed  What can I expect after the procedure? After the procedure, it is common to have:  Nausea.  Mild pain when you urinate. You may feel this pain in your lower back or lower abdomen. The pain should stop within a few minutes after you urinate. This may last for up to 1 week.  A small amount of blood in your urine for several days. Follow these instructions at home: Medicines  Take over-the-counter and prescription medicines only as told by your health care provider.  If you were prescribed an antibiotic medicine, take it as told by your health care provider. Do not stop taking the antibiotic even if you start to feel better.  Do not drive for 24 hours if you were given a sedative during your procedure.  Ask your health care provider if the medicine prescribed to you requires you to avoid driving or using heavy machinery. Activity  Rest as told by your health care provider.  Avoid sitting for a long time without moving. Get up to take short walks every 1-2 hours. This is important to improve blood flow and breathing. Ask for help if you feel weak or unsteady.  Return to your normal activities as told by your health care provider. Ask your health care provider what activities are safe for you. General instructions   Watch for any blood in your urine. Call your health care provider if the amount of blood in your urine increases.  If you have a catheter: ? Follow instructions from your health care provider about taking care of your catheter and collection bag. ? Do not take baths, swim, or use a hot tub until your  health care provider approves. Ask your health care provider if you may take showers. You may only be allowed to take sponge baths.  Drink enough fluid to keep your urine pale yellow.  Do not use any products that contain nicotine or tobacco, such as cigarettes, e-cigarettes, and chewing tobacco. These can delay healing after surgery. If you need help quitting, ask your health care provider.  Keep all follow-up visits as told by your health care provider. This is important. Contact a health care provider if:  You have pain that gets worse or does not get better with medicine, especially pain when you urinate.  You have difficulty urinating.  You feel nauseous or you vomit repeatedly during a period of more than 2 days after the procedure. Get help right away if:  Your urine is dark red or has blood clots in it.  You are leaking urine (have incontinence).  The end of the stent comes out of your urethra.  You cannot urinate.  You have sudden, sharp, or severe pain in your abdomen or lower back.  You have a fever.  You have swelling or pain in your legs.  You have difficulty breathing. Summary  After the procedure, it is common to have mild pain when you urinate that goes away within a few minutes after you urinate. This may last for up to 1 week.  Watch for any blood in your urine. Call your health care provider if the amount of blood in  your urine increases.  Take over-the-counter and prescription medicines only as told by your health care provider.  Drink enough fluid to keep your urine pale yellow. This information is not intended to replace advice given to you by your health care provider. Make sure you discuss any questions you have with your health care provider. Document Revised: 01/27/2018 Document Reviewed: 01/28/2018 Elsevier Patient Education  [place].   Ureteroscopy Ureteroscopy is a procedure to check for and treat problems inside part of the  urinary tract. In this procedure, a thin, tube-shaped instrument with a light at the end (ureteroscope) is used to look at the inside of the kidneys and the ureters, which are the tubes that carry urine from the kidneys to the bladder. The ureteroscope is inserted into one or both of the ureters. You may need this procedure if you have frequent urinary tract infections (UTIs), blood in your urine, or a stone in one of your ureters. A ureteroscopy can be done to find the cause of urine blockage in a ureter and to evaluate other abnormalities inside the ureters or kidneys. If stones are found, they can be removed during the procedure. Polyps, abnormal tissue, and some types of tumors can also be removed or treated. The ureteroscope may also have a tool to remove tissue to be checked for disease under a microscope (biopsy). Tell a health care provider about:  Any allergies you have.  All medicines you are taking, including vitamins, herbs, eye drops, creams, and over-the-counter medicines.  Any problems you or family members have had with anesthetic medicines.  Any blood disorders you have.  Any surgeries you have had.  Any medical conditions you have.  Whether you are pregnant or may be pregnant. What are the risks? Generally, this is a safe procedure. However, problems may occur, including:  Bleeding.  Infection.  Allergic reactions to medicines.  Scarring that narrows the ureter (stricture).  Creating a hole in the ureter (perforation). What happens before the procedure? Staying hydrated Follow instructions from your health care provider about hydration, which may include:  Up to 2 hours before the procedure - you may continue to drink clear liquids, such as water, clear fruit juice, black coffee, and plain tea. Eating and drinking restrictions Follow instructions from your health care provider about eating and drinking, which may include:  8 hours before the procedure - stop  eating heavy meals or foods such as meat, fried foods, or fatty foods.  6 hours before the procedure - stop eating light meals or foods, such as toast or cereal.  6 hours before the procedure - stop drinking milk or drinks that contain milk.  2 hours before the procedure - stop drinking clear liquids. Medicines  Ask your health care provider about: ? Changing or stopping your regular medicines. This is especially important if you are taking diabetes medicines or blood thinners. ? Taking medicines such as aspirin and ibuprofen. These medicines can thin your blood. Do not take these medicines before your procedure if your health care provider instructs you not to.  You may be given antibiotic medicine to help prevent infection. General instructions  You may have a urine sample taken to check for infection.  Plan to have someone take you home from the hospital or clinic. What happens during the procedure?   To reduce your risk of infection: ? Your health care team will wash or sanitize their hands. ? Your skin will be washed with soap.  An IV  tube will be inserted into one of your veins.  You will be given one of the following: ? A medicine to help you relax (sedative). ? A medicine to make you fall asleep (general anesthetic). ? A medicine that is injected into your spine to numb the area below and slightly above the injection site (spinal anesthetic).  To lower your risk of infection, you may be given an antibiotic medicine by an injection or through the IV tube.  The opening from which you urinate (urethra) will be cleaned with a germ-killing solution.  The ureteroscope will be passed through your urethra into your bladder.  A salt-water solution will flow through the ureteroscope to fill your bladder. This will help the health care provider see the openings of your ureters more clearly.  Then, the ureteroscope will be passed into your ureter. ? If a growth is found, a piece  of it may be removed so it can be examined under a microscope (biopsy). ? If a stone is found, it may be removed through the ureteroscope, or the stone may be broken up using a laser, shock waves, or electrical energy. ? In some cases, if the ureter is too small, a tube may be inserted that keeps the ureter open (ureteral stent). The stent may be left in place for 1 or 2 weeks to keep the ureter open, and then the ureteroscopy procedure will be performed.  The scope will be removed, and your bladder will be emptied. The procedure may vary among health care providers and hospitals. What happens after the procedure?  Your blood pressure, heart rate, breathing rate, and blood oxygen level will be monitored until the medicines you were given have worn off.  You may be asked to urinate.  Donot drive for 24 hours if you were given a sedative. This information is not intended to replace advice given to you by your health care provider. Make sure you discuss any questions you have with your health care provider. Document Revised: 04/04/2017 Document Reviewed: 02/02/2016 Elsevier Patient Education  2020 [employer].

## 2019-11-09 NOTE — Op Note (Signed)
Preoperative diagnosis: Right renal stones, right UPJ obstruction Postoperative diagnosis: Same  Procedure: Cystoscopy, right retrograde pyelogram, right ureteroscopy with holmium laser lithotripsy and right ureteral stent placement  Surgeon: Junious Silk  Anesthesia: General  Indication for procedure: Ashley Rivera is a 79 year old female who had 2 stones in the right kidney.  The lower pole stone has enlarged from about 11 to 19 mm over the past years and moved up from the lower pole to the UPJ.  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  Findings: Small urethral carbuncle.  On cystoscopy urethra and bladder are unremarkable.  No stone in the bladder.  Clear eflux bilaterally.  Right retrograde pyelogram-single ureter single collecting system unit with large stone noted in the renal pelvis and a midpole stone.  No significant dilation of collecting system.  On ureteroscopy the ureter appeared normal, right UPJ was patent but had the typical fold which might cause some obstruction.  The kidney looked a bit malrotated once the contrast settled into the calyces.  This large stone was in the renal pelvis and it stayed toward the upper pole and dropped into a posterior upper pole calyx for final fragmentation.  The midpole stone was dusted in place.  Fragmentation was excellent.  No significant stone noted on follow-up fluoroscopy imaging.  Description of procedure: After consent was obtained patient brought to the operating room.  After adequate anesthesia she placed in lithotomy position and prepped and draped in the usual sterile fashion.  Timeout was performed to confirm the patient and procedure.  Cystoscope was passed per urethra and bladder inspected.  Right ureteral orifice cannulated with a 6 [place] open-ended catheter and retrograde injection of contrast was performed.  Sensor  wire advanced to the collecting system and coiled.  Over the sensor wire the inner cannula of the access sheath passed which dilated the distal ureter without difficulty.  The access sheath was then passed without difficulty and I used it to get 2 wires in place and then went adjacent to a Glidewire leaving that as the safety.  I then passed the dual-lumen digital scope into the kidney I was able to negotiate into the right renal pelvis.  A 365 m laser fiber was advanced without difficulty and the stone was dusted in the renal pelvis.  After about a third to that was done I went to the midpole stone and dusted it and then return to the renal pelvic stone and after about two thirds of it was dusted the larger fragment dropped into the upper pole calyx where it was finished off.  No significant fragments were noted on ureteroscopy or fluoroscopy.  Inspected the calyces again and was happy with the fragmentation.  The access sheath was backed out on the cystoscope and the pelvis, UPJ and ureter inspected on the way out and noted to be without significant stone fragment or injury.  The wire was backloaded on the cystoscope and a 6 x 26 cm stent advanced.  The wire was removed with good coil seen in the upper pole calyx and a good coil in the bladder.  The bladder was drained and the scope removed.  She was awakened taken recovery room in stable condition.  Complications: None  Blood loss: Minimal  Specimens: None  Drains: 6 x 26 cm right ureteral stent with string  Disposition: Patient stable to PACU-I called and  discussed with Mr. Ell.  We went over the stent and removal next Monday.  We also discussed she may need a staged procedure or he might need to deal with some fragments if she passes those and has any obstruction.  All questions answered.  Otherwise I will plan to get her back in 4 to 6 weeks with a KUB and ultrasound.

## 2019-11-09 NOTE — Anesthesia Postprocedure Evaluation (Signed)
Anesthesia Post Note  Patient: Ashley Rivera  Procedure(s) Performed: CYSTOSCOPY/URETEROSCOPY/HOLMIUM LASER/STENT PLACEMENT (Right )     Patient location during evaluation: PACU Anesthesia Type: General Level of consciousness: awake and alert Pain management: pain level controlled Vital Signs Assessment: post-procedure vital signs reviewed and stable Respiratory status: spontaneous breathing, nonlabored ventilation, respiratory function stable and patient connected to nasal cannula oxygen Cardiovascular status: blood pressure returned to baseline and stable Postop Assessment: no apparent nausea or vomiting Anesthetic complications: no   No complications documented.  Last Vitals:  Vitals:   11/09/19 1600 11/09/19 1615  BP: (!) 167/96 (!) 176/78  Pulse: 90 87  Resp: 15 15  Temp:    SpO2: 100% 91%    Last Pain:  Vitals:   11/09/19 1615  TempSrc:   PainSc: [place]

## 2019-11-10 ENCOUNTER — Encounter (HOSPITAL_COMMUNITY): Payer: Self-pay | Admitting: Urology

## 2019-11-19 DIAGNOSIS — N202 Calculus of kidney with calculus of ureter: Secondary | ICD-10-CM | POA: Diagnosis not present

## 2019-11-29 DIAGNOSIS — Z0000 Encounter for general adult medical examination without abnormal findings: Secondary | ICD-10-CM | POA: Diagnosis not present

## 2019-11-29 DIAGNOSIS — E559 Vitamin D deficiency, unspecified: Secondary | ICD-10-CM | POA: Diagnosis not present

## 2019-11-29 DIAGNOSIS — R7989 Other specified abnormal findings of blood chemistry: Secondary | ICD-10-CM | POA: Diagnosis not present

## 2019-12-02 DIAGNOSIS — J449 Chronic obstructive pulmonary disease, unspecified: Secondary | ICD-10-CM | POA: Diagnosis not present

## 2019-12-02 DIAGNOSIS — M810 Age-related osteoporosis without current pathological fracture: Secondary | ICD-10-CM | POA: Diagnosis not present

## 2019-12-02 DIAGNOSIS — R030 Elevated blood-pressure reading, without diagnosis of hypertension: Secondary | ICD-10-CM | POA: Diagnosis not present

## 2019-12-02 DIAGNOSIS — R9431 Abnormal electrocardiogram [ECG] [EKG]: Secondary | ICD-10-CM | POA: Diagnosis not present

## 2019-12-02 DIAGNOSIS — Z87891 Personal history of nicotine dependence: Secondary | ICD-10-CM | POA: Diagnosis not present

## 2019-12-02 DIAGNOSIS — Z0000 Encounter for general adult medical examination without abnormal findings: Secondary | ICD-10-CM | POA: Diagnosis not present

## 2019-12-02 DIAGNOSIS — N3946 Mixed incontinence: Secondary | ICD-10-CM | POA: Diagnosis not present

## 2019-12-02 DIAGNOSIS — Z1331 Encounter for screening for depression: Secondary | ICD-10-CM | POA: Diagnosis not present

## 2019-12-02 DIAGNOSIS — K5120 Ulcerative (chronic) proctitis without complications: Secondary | ICD-10-CM | POA: Diagnosis not present

## 2019-12-02 DIAGNOSIS — D692 Other nonthrombocytopenic purpura: Secondary | ICD-10-CM | POA: Diagnosis not present

## 2019-12-06 ENCOUNTER — Other Ambulatory Visit (HOSPITAL_COMMUNITY): Payer: Self-pay | Admitting: Internal Medicine

## 2019-12-06 DIAGNOSIS — R9431 Abnormal electrocardiogram [ECG] [EKG]: Secondary | ICD-10-CM

## 2019-12-30 ENCOUNTER — Telehealth (HOSPITAL_COMMUNITY): Payer: Self-pay

## 2019-12-31 ENCOUNTER — Other Ambulatory Visit (HOSPITAL_COMMUNITY): Payer: PPO

## 2020-01-04 ENCOUNTER — Other Ambulatory Visit (HOSPITAL_COMMUNITY): Payer: PPO

## 2020-01-28 ENCOUNTER — Other Ambulatory Visit: Payer: Self-pay

## 2020-01-28 ENCOUNTER — Encounter: Payer: Self-pay | Admitting: *Deleted

## 2020-01-28 ENCOUNTER — Encounter: Payer: Self-pay | Admitting: Internal Medicine

## 2020-01-28 ENCOUNTER — Ambulatory Visit: Payer: PPO | Admitting: Internal Medicine

## 2020-01-28 VITALS — BP 120/62 | HR 81 | Ht 61.0 in | Wt 142.8 lb

## 2020-01-28 DIAGNOSIS — E7849 Other hyperlipidemia: Secondary | ICD-10-CM

## 2020-01-28 DIAGNOSIS — I2584 Coronary atherosclerosis due to calcified coronary lesion: Secondary | ICD-10-CM

## 2020-01-28 DIAGNOSIS — I10 Essential (primary) hypertension: Secondary | ICD-10-CM

## 2020-01-28 DIAGNOSIS — R0602 Shortness of breath: Secondary | ICD-10-CM | POA: Insufficient documentation

## 2020-01-28 DIAGNOSIS — J449 Chronic obstructive pulmonary disease, unspecified: Secondary | ICD-10-CM | POA: Insufficient documentation

## 2020-01-28 DIAGNOSIS — I2510 Atherosclerotic heart disease of native coronary artery without angina pectoris: Secondary | ICD-10-CM | POA: Diagnosis not present

## 2020-01-28 DIAGNOSIS — R0609 Other forms of dyspnea: Secondary | ICD-10-CM | POA: Insufficient documentation

## 2020-01-28 DIAGNOSIS — R0600 Dyspnea, unspecified: Secondary | ICD-10-CM

## 2020-01-28 NOTE — Progress Notes (Signed)
Cardiology Office Note:    Date:  01/28/2020   ID:  Ashley Rivera, DOB [birth year], MRN [id number]  PCP:  Michael Boston, MD  [facility] Cardiologist:  Werner Lean, MD  [place] Electrophysiologist:  None   Referring MD: Michael Boston, MD   AX:[id number] testing for ECG Consulted for the evaluation of EKG Changes and optimal stress testing at the behest of [place], Jesse Sans, MD  History of Present Illness:    Ashley Rivera is a 79 y.o. female with a hx of COPD, Cecal polyp, HTN , HLD with recent removal who presents with follow up.  Patient had been planned for stress echo but had concerns about getting to target heart rate and concerns with exertion.    Patient notes that she had her physical in July.  At that time, she has  Concern at her ECG.  She was planned for a stress echocardiogram.  Patient noted that she was not sure she would be able to do the exam.    Patient notes that she has been short of breath for many years; attributed to her COPD.  Patient notes that this has not changed.   Patient goes for 30 minute walks; and is not limited (only limited by his symptoms).  Patient notes that she is more short of breath than she used to be. No chest pain, chest pressure, chest tingling, chest stinging, chest heaviness.  Does note chest aching that radiates to her arm and to her back three days ago when she woke up.  Improves with activity.    No hx of MI, no LHC, Never had stress test.   Past Medical History:  Diagnosis Date  . Allergy   . Asthma    bronchial asthma  . Cataract    left eye  . Complication of anesthesia    Became congested after colonoscopy  . COPD (chronic obstructive pulmonary disease) ([place])   . Diverticulosis   . Emphysema of lung ([place])   . Genital warts    Dx about 30 years ago   . GERD (gastroesophageal reflux disease)   . Hemorrhoids   . History of hiatal hernia   . History of kidney stones   . Hx of colonic polyp   . Hx: UTI (urinary  tract infection)   . Osteoporosis   . Pneumonia   . Proctitis 11-12-11  . Skin cancer    basal cell  . Urinary incontinence    On occasion    Past Surgical History:  Procedure Laterality Date  . COLONOSCOPY    . COLONOSCOPY WITH PROPOFOL N/A 10/28/2019   Procedure: COLONOSCOPY WITH PROPOFOL  LARGE CECAL POLYP;  Surgeon: Mauri Pole, MD;  Location: [facility];  Service: Endoscopy;  Laterality: N/A;  . CYSTOSCOPY/URETEROSCOPY/HOLMIUM LASER/STENT PLACEMENT Right 11/09/2019   Procedure: CYSTOSCOPY/URETEROSCOPY/HOLMIUM LASER/STENT PLACEMENT;  Surgeon: Festus Aloe, MD;  Location: [facility] OR[facility];  Service: Urology;  Laterality: Right;  . DILATION AND CURETTAGE OF UTERUS     50 years ago. Removed rest of placenta from birth  . ENDOSCOPIC MUCOSAL RESECTION N/A 10/28/2019   Procedure: ENDOSCOPIC MUCOSAL RESECTION;  Surgeon: Mauri Pole, MD;  Location: [facility];  Service: Endoscopy;  Laterality: N/A;  . EYE SURGERY     cataract  . HEMOSTASIS CLIP PLACEMENT  10/28/2019   Procedure: HEMOSTASIS CLIP PLACEMENT;  Surgeon: Mauri Pole, MD;  Location: [facility];  Service: Endoscopy;;  . INNER EAR SURGERY Right 2006   Tumor removed   .  POLYPECTOMY  10/28/2019   Procedure: POLYPECTOMY;  Surgeon: Mauri Pole, MD;  Location: [facility];  Service: Endoscopy;;  . SUBMUCOSAL LIFTING INJECTION  10/28/2019   Procedure: SUBMUCOSAL LIFTING INJECTION;  Surgeon: Mauri Pole, MD;  Location: [facility];  Service: Endoscopy;;  . SUBMUCOSAL TATTOO INJECTION  10/28/2019   Procedure: SUBMUCOSAL TATTOO INJECTION;  Surgeon: Mauri Pole, MD;  Location: [facility];  Service: Endoscopy;;  . TONSILLECTOMY AND ADENOIDECTOMY  1950   Current Medications: Current Meds  Medication Sig  . B Complex Vitamins (VITAMIN-B COMPLEX PO) Take 1 tablet by mouth daily.  . budesonide-formoterol (SYMBICORT) 160-4.5 MCG/ACT inhaler Inhale 2 puffs into the lungs daily as needed (shortness  of breath).   . calcium carbonate (TUMS - DOSED IN MG ELEMENTAL CALCIUM) 500 MG chewable tablet Chew 1 tablet by mouth as needed for indigestion or heartburn.  . carboxymethylcellulose (REFRESH PLUS) 0.5 % SOLN Place 2 drops into both eyes 2 (two) times daily as needed.  . cetirizine (ZYRTEC) 10 MG tablet Take 10 mg by mouth daily as needed for allergies.   . fish oil-omega-3 fatty acids 1000 MG capsule Take 1 g by mouth 2 (two) times daily.   . folic acid (FOLVITE) 440 MCG tablet Take 400 mcg by mouth daily.   . hydrochlorothiazide (MICROZIDE) 12.5 MG capsule Take 12.5 mg by mouth daily.  . Melatonin 5 MG TABS Take 5 mg by mouth at bedtime.   . mesalamine (CANASA) 1000 MG suppository Place 1,000 mg rectally at bedtime.  . nitrofurantoin (MACRODANTIN) 50 MG capsule Take 1 capsule (50 mg total) by mouth at bedtime.  . Probiotic Product (ALIGN) 4 MG CAPS Take 4 mg by mouth daily.  Marland Kitchen sulfaSALAzine (AZULFIDINE) 500 MG tablet Take 2 tablets (1,000 mg total) by mouth 2 (two) times daily.  Marland Kitchen tiotropium (SPIRIVA) 18 MCG inhalation capsule Place 18 mcg into inhaler and inhale daily as needed (asthma).   . VENTOLIN HFA 108 (90 BASE) MCG/ACT inhaler Inhale 1 puff into the lungs as needed for wheezing or shortness of breath.   . Vitamin D, Cholecalciferol, 50 MCG (2000 UT) CAPS Take 2,000 mg by mouth in the morning and at bedtime.   . Wheat Dextrin (BENEFIBER PO) Take 1 Dose by mouth in the morning and at bedtime.    Allergies:   Patient has no known allergies.   Social History   Socioeconomic History  . Marital status: Married    Spouse name: Not on file  . Number of children: 2  . Years of education: Not on file  . Highest education level: Not on file  Occupational History  . Occupation: Retired   Tobacco Use  . Smoking status: Former Smoker    Years: 30.00    Types: Cigarettes    Quit date: 10/05/2007    Years since quitting: 12.3  . Smokeless tobacco: Never Used  Vaping Use  . Vaping Use:  Never used  Substance and Sexual Activity  . Alcohol use: Yes    Alcohol/week: 2.0 standard drinks    Types: 2 Glasses of wine per week    Comment: once a week  . Drug use: No  . Sexual activity: Not Currently    Partners: Male    Birth control/protection: Post-menopausal  Other Topics Concern  . Not on file  Social History Narrative   Daily caffeine    Social Determinants of Health   Financial Resource Strain:   . Difficulty of Paying Living Expenses: Not on file  Food Insecurity:   . Worried About Charity fundraiser in the Last Year: Not on file  . Ran Out of Food in the Last Year: Not on file  Transportation Needs:   . Lack of Transportation (Medical): Not on file  . Lack of Transportation (Non-Medical): Not on file  Physical Activity:   . Days of Exercise per Week: Not on file  . Minutes of Exercise per Session: Not on file  Stress:   . Feeling of Stress : Not on file  Social Connections:   . Frequency of Communication with Friends and Family: Not on file  . Frequency of Social Gatherings with Friends and Family: Not on file  . Attends Religious Services: Not on file  . Active Member of Clubs or Organizations: Not on file  . Attends Archivist Meetings: Not on file  . Marital Status: Not on file    Family History: The patient's family history includes Breast cancer in her maternal aunt; Colon cancer in her paternal grandmother; Lung cancer in her mother. There is no history of Esophageal cancer, Rectal cancer, or Stomach cancer.  ROS:   Please see the history of present illness.    All other systems reviewed and are negative.  EKGs/Labs/Other Studies Reviewed:    The following studies were reviewed today:  EKG:  EKG is ordered today.  The ekg ordered today demonstrates sinus rhythm rate 81, borderline criteria for true q waves  Recent Labs: 07/30/2019: ALT 17 11/04/2019: BUN 13; Creatinine, Ser 0.71; Hemoglobin 13.0; Platelets 154; Potassium 4.6;  Sodium 141  Creatiniine 0.8 AST/ALT 12/12  11/29/19 Cholesterol 205 Triglycerides 90 HDL 60 LDL 127  2009 CT Chest w/o contrast Mild coronary calcium in LAD (non con no nitro)  Physical Exam:    VS:  BP 120/62   Pulse 81   Ht 5' 1"  (1.549 m)   Wt 142 lb 12.8 oz (64.8 kg)   SpO2 92%   BMI 26.98 kg/m     Wt Readings from Last 3 Encounters:  01/28/20 142 lb 12.8 oz (64.8 kg)  11/04/19 145 lb 2 oz (65.8 kg)  10/28/19 144 lb (65.3 kg)    GEN: Well nourished, well developed in no acute distress HEENT: Normal NECK: No JVD; No carotid bruits LYMPHATICS: No lymphadenopathy CARDIAC: RRR, no murmurs, rubs, gallops RESPIRATORY:  Clear to auscultation without rales, wheezing or rhonchi  ABDOMEN: Soft, non-tender, non-distended MUSCULOSKELETAL:  No edema; No deformity  SKIN: Warm and dry NEUROLOGIC:  Alert and oriented x 3 PSYCHIATRIC:  Normal affect   ASSESSMENT:    1. Chronic obstructive pulmonary disease, unspecified COPD type ([place])   2. Coronary artery calcification    PLAN:    In order of problems listed above:  1. Chest Pain - The patient presents with atypical chest pain.  - CVD risk factors include HTN, HLD, Tobacco use (long) coronary artery calcification.  - continue current medications; will check lipids at next visit if not performed. - Discussed for over 30 minutes the risks and benefits of exercise stress testing, echocardiogram, CCTA, and resting imaging for atypical chest pain with risk factors.   SHARED DECISION MAKING:  Will proceed with Lexiscan; patient amenable to proceed.  If normal, will see in one year.  Medication Adjustments/Labs and Tests Ordered: Current medicines are reviewed at length with the patient today.  Concerns regarding medicines are outlined above.  No orders of the defined types were placed in this encounter.  No orders of the  defined types were placed in this encounter.   There are no Patient Instructions on file for this  visit.   Signed, Werner Lean, MD  01/28/2020 2:12 PM    [facility]

## 2020-01-28 NOTE — Patient Instructions (Signed)
Medication Instructions:  NO CHANGES *If you need a refill on your cardiac medications before your next appointment, please call your pharmacy*   Lab Work: NONE If you have labs (blood work) drawn today and your tests are completely normal, you will receive your results only by: Marland Kitchen MyChart Message (if you have MyChart) OR . A paper copy in the mail If you have any lab test that is abnormal or we need to change your treatment, we will call you to review the results.   Testing/Procedures: Your physician has requested that you have a lexiscan myoview. For further information please visit [email or web address]. Please follow instruction sheet, as given.    Follow-Up: At [facility], you and your health needs are our priority.  As part of our continuing mission to provide you with exceptional heart care, we have created designated Provider Care Teams.  These Care Teams include your primary Cardiologist (physician) and Advanced Practice Providers (APPs -  Physician Assistants and Nurse Practitioners) who all work together to provide you with the care you need, when you need it.  We recommend signing up for the patient portal called "MyChart".  Sign up information is provided on this After Visit Summary.  MyChart is used to connect with patients for Virtual Visits (Telemedicine).  Patients are able to view lab/test results, encounter notes, upcoming appointments, etc.  Non-urgent messages can be sent to your provider as well.   To learn more about what you can do with MyChart, go to [email or web address].    Your next appointment:   1 year(s)  The format for your next appointment:   In Person  Provider:   Rudean Haskell, MD   Other Instructions

## 2020-02-02 ENCOUNTER — Telehealth (HOSPITAL_COMMUNITY): Payer: Self-pay | Admitting: *Deleted

## 2020-02-02 ENCOUNTER — Encounter (HOSPITAL_COMMUNITY): Payer: Self-pay | Admitting: *Deleted

## 2020-02-02 NOTE — Telephone Encounter (Signed)
Left message on voicemail per DPR in reference to upcoming appointment scheduled on 02/09/2020 at 1045 with detailed instructions given per Myocardial Perfusion Study Information Sheet for the test. LM to arrive 15 minutes early, and that it is imperative to arrive on time for appointment to keep from having the test rescheduled. If you need to cancel or reschedule your appointment, please call the office within 24 hours of your appointment. Failure to do so may result in a cancellation of your appointment, and a $50 no show fee. Phone number given for call back for any questions. Mychart letter sent with instructions.Janyia Guion, [place]

## 2020-02-08 DIAGNOSIS — N200 Calculus of kidney: Secondary | ICD-10-CM | POA: Diagnosis not present

## 2020-02-08 DIAGNOSIS — N130 Hydronephrosis with ureteropelvic junction obstruction: Secondary | ICD-10-CM | POA: Diagnosis not present

## 2020-02-09 ENCOUNTER — Other Ambulatory Visit: Payer: Self-pay

## 2020-02-09 ENCOUNTER — Ambulatory Visit (HOSPITAL_COMMUNITY): Payer: PPO | Attending: Internal Medicine

## 2020-02-09 DIAGNOSIS — R0600 Dyspnea, unspecified: Secondary | ICD-10-CM | POA: Diagnosis not present

## 2020-02-09 DIAGNOSIS — R0609 Other forms of dyspnea: Secondary | ICD-10-CM

## 2020-02-09 LAB — MYOCARDIAL PERFUSION IMAGING
LV dias vol: 46 mL (ref 46–106)
LV sys vol: 13 mL
Peak HR: 97 {beats}/min
Rest HR: 77 {beats}/min
SDS: 2
SRS: 0
SSS: 2
TID: 1

## 2020-02-09 MED ORDER — REGADENOSON 0.4 MG/5ML IV SOLN
0.4000 mg | Freq: Once | INTRAVENOUS | Status: AC
Start: 2020-02-09 — End: 2020-02-09
  Administered 2020-02-09: 0.4 mg via INTRAVENOUS

## 2020-02-09 MED ORDER — TECHNETIUM TC 99M TETROFOSMIN IV KIT
10.4000 | PACK | Freq: Once | INTRAVENOUS | Status: AC | PRN
  Administered 2020-02-09: 10.4 via INTRAVENOUS
  Filled 2020-02-09: qty 11

## 2020-02-09 MED ORDER — TECHNETIUM TC 99M TETROFOSMIN IV KIT
31.3000 | PACK | Freq: Once | INTRAVENOUS | Status: AC | PRN
  Administered 2020-02-09: 31.3 via INTRAVENOUS
  Filled 2020-02-09: qty 32

## 2020-02-10 DIAGNOSIS — H16232 Neurotrophic keratoconjunctivitis, left eye: Secondary | ICD-10-CM | POA: Diagnosis not present

## 2020-02-10 DIAGNOSIS — Z961 Presence of intraocular lens: Secondary | ICD-10-CM | POA: Diagnosis not present

## 2020-02-10 DIAGNOSIS — H52203 Unspecified astigmatism, bilateral: Secondary | ICD-10-CM | POA: Diagnosis not present

## 2020-02-10 DIAGNOSIS — H26491 Other secondary cataract, right eye: Secondary | ICD-10-CM | POA: Diagnosis not present

## 2020-02-18 ENCOUNTER — Telehealth: Payer: Self-pay

## 2020-02-18 NOTE — Telephone Encounter (Signed)
Spoke to pt regarding her results, per MD occasional PVC, like small diaphragmatic artifact with plan to continue current therapy. Pt verbalized understanding and will call us back if she has further questions/concerns that arise.

## 2020-02-21 DIAGNOSIS — Z1231 Encounter for screening mammogram for malignant neoplasm of breast: Secondary | ICD-10-CM | POA: Diagnosis not present

## 2020-04-10 ENCOUNTER — Ambulatory Visit: Payer: PPO | Admitting: Gastroenterology

## 2020-04-10 ENCOUNTER — Encounter: Payer: Self-pay | Admitting: Gastroenterology

## 2020-04-10 VITALS — BP 142/70 | HR 91 | Ht 61.0 in | Wt 144.1 lb

## 2020-04-10 DIAGNOSIS — Z86010 Personal history of colonic polyps: Secondary | ICD-10-CM | POA: Diagnosis not present

## 2020-04-10 DIAGNOSIS — K625 Hemorrhage of anus and rectum: Secondary | ICD-10-CM

## 2020-04-10 MED ORDER — HYDROCORTISONE ACETATE 25 MG RE SUPP
25.0000 mg | Freq: Every evening | RECTAL | 1 refills | Status: DC
Start: 2020-04-10 — End: 2021-04-23

## 2020-04-10 MED ORDER — SUPREP BOWEL PREP KIT 17.5-3.13-1.6 GM/177ML PO SOLN: 1.0000 | Freq: Once | ORAL | 0 refills | Status: AC

## 2020-04-10 NOTE — Patient Instructions (Signed)
You have been scheduled for a Colonoscopy at [facility], separate instructions have been given  We will send Anusol suppositories to your pharmacy, if not covered by your insurance you can use Preparation H suppositories at bedtime for 7 days  Take Benefiber 1 tablespoon twice a day   If you are age 79 or older, your body mass index should be between 23-30. Your Body mass index is 27.23 kg/m. If this is out of the aforementioned range listed, please consider follow up with your Primary Care Provider.  If you are age 81 or younger, your body mass index should be between 19-25. Your Body mass index is 27.23 kg/m. If this is out of the aformentioned range listed, please consider follow up with your Primary Care Provider.    Due to recent changes in healthcare laws, you may see the results of your imaging and laboratory studies on MyChart before your provider has had a chance to review them.  We understand that in some cases there may be results that are confusing or concerning to you. Not all laboratory results come back in the same time frame and the provider may be waiting for multiple results in order to interpret others.  Please give [place] 48 hours in order for your provider to thoroughly review all the results before contacting the office for clarification of your results.   I appreciate the  opportunity to care for you  Thank You   Harl Bowie , MD

## 2020-04-10 NOTE — Progress Notes (Signed)
Ashley Rivera    [id number]    04/17/1941  Primary Care Physician:Wile, Jesse Sans, MD  Referring Physician: Michael Boston, MD [address] [place],  [place] [address]   Chief complaint:  Colon polyps, rectal bleeding  HPI:  79 year old female here for follow-up visit to discuss colonoscopy findings and endoscopic mucosal resection of large cecal polyp  Bowel habits are at baseline with 1-2 soft bowel movements daily.    She is having excessive gas is full order and also having episodes of small volume rectal bleeding associated with bowel movement in the past few weeks intermittently  Colonoscopy October 28, 2019: Large cecal polyp, resected using EMR technique.  Left-sided diverticulosis and internal hemorrhoids  Colonoscopy August 13, 2019: 25 mm polyps lateral spreading cecal polyp not removed.  2 small sessile polyps removed.  Diverticulosis and hemorrhoids.  Bowel habits are at baseline with 1-2 soft bowel movements daily.    She is having excessive gas is full order and also had an episode of rectal bleeding associated with bowel movement the past few weeks intermittently.  Colonoscopy in 2013.She had adenomatous polyp removed in 2000.She has been reluctant to undergo surveillance colonoscopy in the past.   Outpatient Encounter Medications as of 04/10/2020  Medication Sig  . B Complex Vitamins (VITAMIN-B COMPLEX PO) Take 1 tablet by mouth daily.  . budesonide-formoterol (SYMBICORT) 160-4.5 MCG/ACT inhaler Inhale 2 puffs into the lungs daily as needed (shortness of breath).   . calcium carbonate (TUMS - DOSED IN MG ELEMENTAL CALCIUM) 500 MG chewable tablet Chew 1 tablet by mouth as needed for indigestion or heartburn.  . carboxymethylcellulose (REFRESH PLUS) 0.5 % SOLN Place 2 drops into both eyes 2 (two) times daily as needed.  . cetirizine (ZYRTEC) 10 MG tablet Take 10 mg by mouth daily as needed for allergies.   . fish oil-omega-3 fatty acids 1000 MG capsule Take 1 g  by mouth 2 (two) times daily.   . folic acid (FOLVITE) 767 MCG tablet Take 400 mcg by mouth daily.   . hydrochlorothiazide (MICROZIDE) 12.5 MG capsule Take 12.5 mg by mouth daily.  . Melatonin 5 MG TABS Take 5 mg by mouth at bedtime.   . mesalamine (CANASA) 1000 MG suppository Place 1,000 mg rectally at bedtime.  . nitrofurantoin (MACRODANTIN) 50 MG capsule Take 1 capsule (50 mg total) by mouth at bedtime.  . Probiotic Product (ALIGN) 4 MG CAPS Take 4 mg by mouth daily.  Marland Kitchen sulfaSALAzine (AZULFIDINE) 500 MG tablet Take 2 tablets (1,000 mg total) by mouth 2 (two) times daily.  Marland Kitchen tiotropium (SPIRIVA) 18 MCG inhalation capsule Place 18 mcg into inhaler and inhale daily as needed (asthma).   . VENTOLIN HFA 108 (90 BASE) MCG/ACT inhaler Inhale 1 puff into the lungs as needed for wheezing or shortness of breath.   . Vitamin D, Cholecalciferol, 50 MCG (2000 UT) CAPS Take 2,000 mg by mouth in the morning and at bedtime.   . Wheat Dextrin (BENEFIBER PO) Take 1 Dose by mouth in the morning and at bedtime.   No facility-administered encounter medications on file as of 04/10/2020.    Allergies as of 04/10/2020  . (No Known Allergies)    Past Medical History:  Diagnosis Date  . Allergy   . Asthma    bronchial asthma  . Cataract    left eye  . Complication of anesthesia    Became congested after colonoscopy  . COPD (chronic  obstructive pulmonary disease) ([place])   . Diverticulosis   . Emphysema of lung ([place])   . Genital warts    Dx about 30 years ago   . GERD (gastroesophageal reflux disease)   . Hemorrhoids   . History of hiatal hernia   . History of kidney stones   . Hx of colonic polyp   . Hx: UTI (urinary tract infection)   . Osteoporosis   . Pneumonia   . Proctitis 11-12-11  . Skin cancer    basal cell  . Urinary incontinence    On occasion    Past Surgical History:  Procedure Laterality Date  . COLONOSCOPY    . COLONOSCOPY WITH PROPOFOL N/A 10/28/2019   Procedure: COLONOSCOPY  WITH PROPOFOL  LARGE CECAL POLYP;  Surgeon: Mauri Pole, MD;  Location: [facility];  Service: Endoscopy;  Laterality: N/A;  . CYSTOSCOPY/URETEROSCOPY/HOLMIUM LASER/STENT PLACEMENT Right 11/09/2019   Procedure: CYSTOSCOPY/URETEROSCOPY/HOLMIUM LASER/STENT PLACEMENT;  Surgeon: Festus Aloe, MD;  Location: [facility];  Service: Urology;  Laterality: Right;  . DILATION AND CURETTAGE OF UTERUS     50 years ago. Removed rest of placenta from birth  . ENDOSCOPIC MUCOSAL RESECTION N/A 10/28/2019   Procedure: ENDOSCOPIC MUCOSAL RESECTION;  Surgeon: Mauri Pole, MD;  Location: [facility];  Service: Endoscopy;  Laterality: N/A;  . EYE SURGERY     cataract  . HEMOSTASIS CLIP PLACEMENT  10/28/2019   Procedure: HEMOSTASIS CLIP PLACEMENT;  Surgeon: Mauri Pole, MD;  Location: [facility];  Service: Endoscopy;;  . INNER EAR SURGERY Right 2006   Tumor removed   . POLYPECTOMY  10/28/2019   Procedure: POLYPECTOMY;  Surgeon: Mauri Pole, MD;  Location: [facility];  Service: Endoscopy;;  . SUBMUCOSAL LIFTING INJECTION  10/28/2019   Procedure: SUBMUCOSAL LIFTING INJECTION;  Surgeon: Mauri Pole, MD;  Location: [facility];  Service: Endoscopy;;  . SUBMUCOSAL TATTOO INJECTION  10/28/2019   Procedure: SUBMUCOSAL TATTOO INJECTION;  Surgeon: Mauri Pole, MD;  Location: [facility];  Service: Endoscopy;;  . TONSILLECTOMY AND ADENOIDECTOMY  1950    Family History  Problem Relation Age of Onset  . Lung cancer Mother   . Colon cancer Paternal Grandmother   . Breast cancer Maternal Aunt   . Esophageal cancer Neg Hx   . Rectal cancer Neg Hx   . Stomach cancer Neg Hx     Social History   Socioeconomic History  . Marital status: Married    Spouse name: Not on file  . Number of children: 2  . Years of education: Not on file  . Highest education level: Not on file  Occupational History  . Occupation: Retired   Tobacco Use  . Smoking status: Former Smoker     Years: 30.00    Types: Cigarettes    Quit date: 10/05/2007    Years since quitting: 12.5  . Smokeless tobacco: Never Used  Vaping Use  . Vaping Use: Never used  Substance and Sexual Activity  . Alcohol use: Yes    Alcohol/week: 2.0 standard drinks    Types: 2 Glasses of wine per week    Comment: once a week  . Drug use: No  . Sexual activity: Not Currently    Partners: Male    Birth control/protection: Post-menopausal  Other Topics Concern  . Not on file  Social History Narrative   Daily caffeine    Social Determinants of Health   Financial Resource Strain:   . Difficulty of Paying Living Expenses: Not on  file  Food Insecurity:   . Worried About Charity fundraiser in the Last Year: Not on file  . Ran Out of Food in the Last Year: Not on file  Transportation Needs:   . Lack of Transportation (Medical): Not on file  . Lack of Transportation (Non-Medical): Not on file  Physical Activity:   . Days of Exercise per Week: Not on file  . Minutes of Exercise per Session: Not on file  Stress:   . Feeling of Stress : Not on file  Social Connections:   . Frequency of Communication with Friends and Family: Not on file  . Frequency of Social Gatherings with Friends and Family: Not on file  . Attends Religious Services: Not on file  . Active Member of Clubs or Organizations: Not on file  . Attends Archivist Meetings: Not on file  . Marital Status: Not on file  Intimate Partner Violence:   . Fear of Current or Ex-Partner: Not on file  . Emotionally Abused: Not on file  . Physically Abused: Not on file  . Sexually Abused: Not on file      Review of systems: All other review of systems negative except as mentioned in the HPI.   Physical Exam: Vitals:   04/10/20 1541  BP: (!) 142/70  Pulse: 91  SpO2: 99%   Body mass index is 27.23 kg/m. Gen:      No acute distress HEENT:  sclera anicteric Abd:      soft, non-tender; no palpable masses, no distension Ext:     No edema Neuro: alert and oriented x 3 Psych: normal mood and affect Rectal exam: Decreased anal sphincter tone, no anal fissure or external hemorrhoids Anoscopy: Small grade 2 internal hemorrhoids, visualized mucosa in distal rectum appeared normal, no active bleeding, normal dentate line, no visible nodules  Data Reviewed:  Reviewed labs, radiology imaging, old records and pertinent past GI work up   Assessment and Plan/Recommendations:  79 year old very pleasant female with history of chronic ulcerative proctitis, large adenomatous colon polyps s/p endoscopic mucosal resection  Small-volume rectal bleeding associated with defecation likely secondary to internal hemorrhoids Use Anusol suppository at bedtime X 7 days Benefiber 1 tablespoon 2-3 times daily with meals Avoid excessive straining during defecation  Ulcerative proctitis: Continue sulfasalazine along with folic acid  History of large sessile adenomatous colon polyp removed piecemeal mucosal resection Due for surveillance colonoscopy, will schedule it at [facility] The risks and benefits as well as alternatives of endoscopic procedure(s) have been discussed and reviewed. All questions answered. The patient agrees to proceed.   This visit required >30 minutes of patient care (this includes precharting, chart review, review of results, face-to-face time used for counseling as well as treatment plan and follow-up. The patient was provided an opportunity to ask questions and all were answered. The patient agreed with the plan and demonstrated an understanding of the instructions.  Damaris Hippo , MD    CC: Michael Boston, MD

## 2020-04-11 ENCOUNTER — Encounter: Payer: Self-pay | Admitting: Gastroenterology

## 2020-05-04 ENCOUNTER — Telehealth: Payer: Self-pay | Admitting: Gastroenterology

## 2020-05-04 NOTE — Telephone Encounter (Signed)
Called patient twice to reschedule [facility] colonoscopy, each time patient hung the phone up.

## 2020-05-04 NOTE — Telephone Encounter (Signed)
Inbound call from patient requesting a call back please.  States she is needing to reschedule her procedure for [facility] on 05/30/20 to different date.

## 2020-05-08 NOTE — Telephone Encounter (Signed)
Patient called back in regards to rescheduling procedure.

## 2020-05-08 NOTE — Telephone Encounter (Signed)
Spoke with the patient. She is not sick. The 05/30/20 date is not convenient for her.  Will talk with Dr Silverio Decamp when she returns. May move to 07/31/20 if okay.

## 2020-05-11 ENCOUNTER — Other Ambulatory Visit: Payer: Self-pay

## 2020-05-11 NOTE — Telephone Encounter (Signed)
Spoke with the patient. Rescheduled her colonoscopy to 07/03/20 at 11:15 am [facility]. COVID screen 06/29/20 at 2:00 pm. Patient agrees to this change. She has her prep solution. New instructions will be mailed to her.

## 2020-05-24 DIAGNOSIS — L821 Other seborrheic keratosis: Secondary | ICD-10-CM | POA: Diagnosis not present

## 2020-05-24 DIAGNOSIS — L570 Actinic keratosis: Secondary | ICD-10-CM | POA: Diagnosis not present

## 2020-05-24 DIAGNOSIS — L738 Other specified follicular disorders: Secondary | ICD-10-CM | POA: Diagnosis not present

## 2020-05-24 DIAGNOSIS — Z85828 Personal history of other malignant neoplasm of skin: Secondary | ICD-10-CM | POA: Diagnosis not present

## 2020-05-24 DIAGNOSIS — L820 Inflamed seborrheic keratosis: Secondary | ICD-10-CM | POA: Diagnosis not present

## 2020-05-24 DIAGNOSIS — D485 Neoplasm of uncertain behavior of skin: Secondary | ICD-10-CM | POA: Diagnosis not present

## 2020-05-24 DIAGNOSIS — D2271 Melanocytic nevi of right lower limb, including hip: Secondary | ICD-10-CM | POA: Diagnosis not present

## 2020-05-24 DIAGNOSIS — D0439 Carcinoma in situ of skin of other parts of face: Secondary | ICD-10-CM | POA: Diagnosis not present

## 2020-05-24 DIAGNOSIS — D2272 Melanocytic nevi of left lower limb, including hip: Secondary | ICD-10-CM | POA: Diagnosis not present

## 2020-05-26 ENCOUNTER — Other Ambulatory Visit (HOSPITAL_COMMUNITY): Payer: PPO

## 2020-05-30 DIAGNOSIS — K5120 Ulcerative (chronic) proctitis without complications: Secondary | ICD-10-CM | POA: Diagnosis not present

## 2020-05-30 DIAGNOSIS — J449 Chronic obstructive pulmonary disease, unspecified: Secondary | ICD-10-CM | POA: Diagnosis not present

## 2020-05-30 DIAGNOSIS — I700 Atherosclerosis of aorta: Secondary | ICD-10-CM | POA: Diagnosis not present

## 2020-05-30 DIAGNOSIS — I10 Essential (primary) hypertension: Secondary | ICD-10-CM | POA: Diagnosis not present

## 2020-05-30 DIAGNOSIS — D329 Benign neoplasm of meninges, unspecified: Secondary | ICD-10-CM | POA: Diagnosis not present

## 2020-05-30 DIAGNOSIS — G4762 Sleep related leg cramps: Secondary | ICD-10-CM | POA: Diagnosis not present

## 2020-05-30 DIAGNOSIS — E785 Hyperlipidemia, unspecified: Secondary | ICD-10-CM | POA: Diagnosis not present

## 2020-05-30 DIAGNOSIS — H9202 Otalgia, left ear: Secondary | ICD-10-CM | POA: Diagnosis not present

## 2020-05-30 DIAGNOSIS — Z87891 Personal history of nicotine dependence: Secondary | ICD-10-CM | POA: Diagnosis not present

## 2020-05-31 ENCOUNTER — Other Ambulatory Visit: Payer: Self-pay | Admitting: Neurosurgery

## 2020-05-31 DIAGNOSIS — D329 Benign neoplasm of meninges, unspecified: Secondary | ICD-10-CM

## 2020-06-14 DIAGNOSIS — D0422 Carcinoma in situ of skin of left ear and external auricular canal: Secondary | ICD-10-CM | POA: Diagnosis not present

## 2020-06-23 ENCOUNTER — Other Ambulatory Visit: Payer: Self-pay

## 2020-06-28 ENCOUNTER — Other Ambulatory Visit: Payer: Self-pay

## 2020-06-28 ENCOUNTER — Ambulatory Visit
Admission: RE | Admit: 2020-06-28 | Discharge: 2020-06-28 | Disposition: A | Discharge status: Home / Self Care | Payer: PPO | Source: Ambulatory Visit | Attending: Neurosurgery | Admitting: Neurosurgery

## 2020-06-28 DIAGNOSIS — Q283 Other malformations of cerebral vessels: Secondary | ICD-10-CM | POA: Diagnosis not present

## 2020-06-28 DIAGNOSIS — G9389 Other specified disorders of brain: Secondary | ICD-10-CM | POA: Diagnosis not present

## 2020-06-28 DIAGNOSIS — D329 Benign neoplasm of meninges, unspecified: Secondary | ICD-10-CM | POA: Diagnosis not present

## 2020-06-28 DIAGNOSIS — J3489 Other specified disorders of nose and nasal sinuses: Secondary | ICD-10-CM | POA: Diagnosis not present

## 2020-06-28 MED ORDER — GADOBENATE DIMEGLUMINE 529 MG/ML IV SOLN
14.0000 mL | Freq: Once | INTRAVENOUS | Status: AC | PRN
  Administered 2020-06-28: 14 mL via INTRAVENOUS

## 2020-06-29 ENCOUNTER — Other Ambulatory Visit (HOSPITAL_COMMUNITY)
Admission: RE | Admit: 2020-06-29 | Discharge: 2020-06-29 | Disposition: A | Discharge status: Home / Self Care | Payer: PPO | Source: Ambulatory Visit | Attending: Gastroenterology | Admitting: Gastroenterology

## 2020-06-29 DIAGNOSIS — Z01812 Encounter for preprocedural laboratory examination: Secondary | ICD-10-CM | POA: Insufficient documentation

## 2020-06-29 DIAGNOSIS — Z20822 Contact with and (suspected) exposure to covid-19: Secondary | ICD-10-CM | POA: Insufficient documentation

## 2020-06-30 LAB — SARS CORONAVIRUS 2 (TAT 6-24 HRS): SARS Coronavirus 2: NEGATIVE

## 2020-07-03 ENCOUNTER — Ambulatory Visit (HOSPITAL_COMMUNITY): Payer: PPO | Admitting: Anesthesiology

## 2020-07-03 ENCOUNTER — Encounter (HOSPITAL_COMMUNITY)
Admission: RE | Disposition: A | Discharge status: Home / Self Care | Payer: Self-pay | Source: Home / Self Care | Attending: Gastroenterology

## 2020-07-03 ENCOUNTER — Encounter (HOSPITAL_COMMUNITY): Payer: Self-pay | Admitting: Gastroenterology

## 2020-07-03 ENCOUNTER — Other Ambulatory Visit: Payer: Self-pay

## 2020-07-03 ENCOUNTER — Ambulatory Visit (HOSPITAL_COMMUNITY)
Admission: RE | Admit: 2020-07-03 | Discharge: 2020-07-03 | Disposition: A | Discharge status: Home / Self Care | Payer: PPO | Attending: Gastroenterology | Admitting: Gastroenterology

## 2020-07-03 DIAGNOSIS — Z800 Family history of malignant neoplasm of digestive organs: Secondary | ICD-10-CM | POA: Insufficient documentation

## 2020-07-03 DIAGNOSIS — Z7951 Long term (current) use of inhaled steroids: Secondary | ICD-10-CM | POA: Diagnosis not present

## 2020-07-03 DIAGNOSIS — Z79899 Other long term (current) drug therapy: Secondary | ICD-10-CM | POA: Diagnosis not present

## 2020-07-03 DIAGNOSIS — Z87891 Personal history of nicotine dependence: Secondary | ICD-10-CM | POA: Diagnosis not present

## 2020-07-03 DIAGNOSIS — K648 Other hemorrhoids: Secondary | ICD-10-CM | POA: Insufficient documentation

## 2020-07-03 DIAGNOSIS — Z85828 Personal history of other malignant neoplasm of skin: Secondary | ICD-10-CM | POA: Insufficient documentation

## 2020-07-03 DIAGNOSIS — K621 Rectal polyp: Secondary | ICD-10-CM

## 2020-07-03 DIAGNOSIS — D126 Benign neoplasm of colon, unspecified: Secondary | ICD-10-CM | POA: Diagnosis not present

## 2020-07-03 DIAGNOSIS — K5730 Diverticulosis of large intestine without perforation or abscess without bleeding: Secondary | ICD-10-CM | POA: Diagnosis not present

## 2020-07-03 DIAGNOSIS — K635 Polyp of colon: Secondary | ICD-10-CM | POA: Diagnosis not present

## 2020-07-03 DIAGNOSIS — Z09 Encounter for follow-up examination after completed treatment for conditions other than malignant neoplasm: Secondary | ICD-10-CM | POA: Diagnosis present

## 2020-07-03 DIAGNOSIS — Z86010 Personal history of colonic polyps: Secondary | ICD-10-CM | POA: Insufficient documentation

## 2020-07-03 DIAGNOSIS — K644 Residual hemorrhoidal skin tags: Secondary | ICD-10-CM | POA: Diagnosis not present

## 2020-07-03 HISTORY — PX: POLYPECTOMY: SHX5525

## 2020-07-03 HISTORY — PX: BIOPSY: SHX5522

## 2020-07-03 HISTORY — PX: COLONOSCOPY WITH PROPOFOL: SHX5780

## 2020-07-03 SURGERY — COLONOSCOPY WITH PROPOFOL
Anesthesia: Monitor Anesthesia Care

## 2020-07-03 MED ORDER — PROPOFOL 500 MG/50ML IV EMUL
INTRAVENOUS | Status: DC | PRN
  Administered 2020-07-03: 250 ug/kg/min via INTRAVENOUS

## 2020-07-03 MED ORDER — LACTATED RINGERS IV SOLN: INTRAVENOUS | Status: DC | PRN

## 2020-07-03 MED ORDER — SODIUM CHLORIDE 0.9 % IV SOLN: INTRAVENOUS | Status: DC

## 2020-07-03 MED ORDER — PROPOFOL 500 MG/50ML IV EMUL
INTRAVENOUS | Status: AC
  Filled 2020-07-03: qty 50

## 2020-07-03 MED ORDER — HYDROCORTISONE (PERIANAL) 2.5 % EX CREA: 1.0000 "application " | TOPICAL_CREAM | Freq: Two times a day (BID) | CUTANEOUS | 0 refills | Status: DC

## 2020-07-03 MED ORDER — LACTATED RINGERS IV SOLN
INTRAVENOUS | Status: AC | PRN
  Administered 2020-07-03: 1000 mL via INTRAVENOUS

## 2020-07-03 SURGICAL SUPPLY — 22 items

## 2020-07-03 NOTE — Anesthesia Postprocedure Evaluation (Signed)
Anesthesia Post Note  Patient: Ashley Rivera  Procedure(s) Performed: COLONOSCOPY WITH PROPOFOL (N/A ) POLYPECTOMY BIOPSY     Patient location during evaluation: PACU Anesthesia Type: MAC Level of consciousness: awake and alert Pain management: pain level controlled Vital Signs Assessment: post-procedure vital signs reviewed and stable Respiratory status: spontaneous breathing and respiratory function stable Cardiovascular status: stable Postop Assessment: no apparent nausea or vomiting Anesthetic complications: no   No complications documented.  Last Vitals:  Vitals:   07/03/20 0930 07/03/20 0940  BP: (!) 117/47 (!) 130/53  Pulse: 80 81  Resp: 16 17  Temp:    SpO2: 100% 93%    Last Pain:  Vitals:   07/03/20 0940  TempSrc:   PainSc: 0-No pain                 Merlinda Frederick

## 2020-07-03 NOTE — Transfer of Care (Signed)
Immediate Anesthesia Transfer of Care Note  Patient: Ashley Rivera  Procedure(s) Performed: COLONOSCOPY WITH PROPOFOL (N/A ) POLYPECTOMY BIOPSY  Patient Location: PACU  Anesthesia Type:MAC  Level of Consciousness: sedated.  Airway & Oxygen Therapy: Patient Spontanous Breathing and Patient connected to face mask oxygen  Post-op Assessment: Report given to RN, Post -op Vital signs reviewed and stable and Patient moving all extremities X 4  Post vital signs: stable  Last Vitals:  Vitals Value Taken Time  BP 125/52 07/03/20 0926  Temp    Pulse 80 07/03/20 0927  Resp 20 07/03/20 0927  SpO2 100 % 07/03/20 0927  Vitals shown include unvalidated device data.  Last Pain:  Vitals:   07/03/20 0755  TempSrc: Oral  PainSc: 0-No pain         Complications: No complications documented.

## 2020-07-03 NOTE — Anesthesia Procedure Notes (Signed)
Procedure Name: MAC Date/Time: 07/03/2020 8:50 AM Performed by: Lissa Morales, CRNA Pre-anesthesia Checklist: Patient identified, Emergency Drugs available, Suction available, Patient being monitored and Timeout performed Patient Re-evaluated:Patient Re-evaluated prior to induction Oxygen Delivery Method: Simple face mask Placement Confirmation: positive ETCO2

## 2020-07-03 NOTE — Anesthesia Preprocedure Evaluation (Addendum)
Anesthesia Evaluation  Patient identified by MRN, date of birth, ID band Patient awake    Reviewed: Allergy & Precautions, NPO status , Patient's Chart, lab work & pertinent test results  History of Anesthesia Complications Negative for: history of anesthetic complications  Airway Mallampati: II  TM Distance: >3 FB Neck ROM: Full    Dental  (+) Dental Advisory Given   Pulmonary asthma , COPD,  COPD inhaler, former smoker,    Pulmonary exam normal        Cardiovascular Normal cardiovascular exam     Neuro/Psych negative neurological ROS  negative psych ROS   GI/Hepatic Neg liver ROS, hiatal hernia, GERD  Controlled,  Endo/Other  negative endocrine ROS  Renal/GU negative Renal ROS  negative genitourinary   Musculoskeletal negative musculoskeletal ROS (+)   Abdominal   Peds negative pediatric ROS (+)  Hematology negative hematology ROS (+)   Anesthesia Other Findings   Reproductive/Obstetrics negative OB ROS                            Anesthesia Physical  Anesthesia Plan  ASA: II  Anesthesia Plan: MAC   Post-op Pain Management:    Induction: Intravenous  PONV Risk Score and Plan: 2 and Propofol infusion and Treatment may vary due to age or medical condition  Airway Management Planned: Nasal Cannula and Natural Airway  Additional Equipment: None  Intra-op Plan:   Post-operative Plan:   Informed Consent: I have reviewed the patients History and Physical, chart, labs and discussed the procedure including the risks, benefits and alternatives for the proposed anesthesia with the patient or authorized representative who has indicated his/her understanding and acceptance.       Plan Discussed with: CRNA and Anesthesiologist  Anesthesia Plan Comments:        Anesthesia Quick Evaluation

## 2020-07-03 NOTE — Discharge Instructions (Signed)
YOU HAD AN ENDOSCOPIC PROCEDURE TODAY: Refer to the procedure report and other information in the discharge instructions given to you for any specific questions about what was found during the examination. If this information does not answer your questions, please call [facility] office at [phone] to clarify.  ° °YOU SHOULD EXPECT: Some feelings of bloating in the abdomen. Passage of more gas than usual. Walking can help get rid of the air that was put into your GI tract during the procedure and reduce the bloating. If you had a lower endoscopy (such as a colonoscopy or flexible sigmoidoscopy) you may notice spotting of blood in your stool or on the toilet paper. Some abdominal soreness may be present for a day or two, also. ° °DIET: Your first meal following the procedure should be a light meal and then it is ok to progress to your normal diet. A half-sandwich or bowl of soup is an example of a good first meal. Heavy or fried foods are harder to digest and may make you feel nauseous or bloated. Drink plenty of fluids but you should avoid alcoholic beverages for 24 hours. If you had a esophageal dilation, please see attached instructions for diet.   ° °ACTIVITY: Your care partner should take you home directly after the procedure. You should plan to take it easy, moving slowly for the rest of the day. You can resume normal activity the day after the procedure however YOU SHOULD NOT DRIVE, use power tools, machinery or perform tasks that involve climbing or major physical exertion for 24 hours (because of the sedation medicines used during the test).  ° °SYMPTOMS TO REPORT IMMEDIATELY: °A gastroenterologist can be reached at any hour. Please call [phone]  for any of the following symptoms:  °Following lower endoscopy (colonoscopy, flexible sigmoidoscopy) °Excessive amounts of blood in the stool  °Significant tenderness, worsening of abdominal pains  °Swelling of the abdomen that is new, acute  °Fever of 100° or  higher  °Following upper endoscopy (EGD, EUS, ERCP, esophageal dilation) °Vomiting of blood or coffee ground material  °New, significant abdominal pain  °New, significant chest pain or pain under the shoulder blades  °Painful or persistently difficult swallowing  °New shortness of breath  °Black, tarry-looking or red, bloody stools ° °FOLLOW UP:  °If any biopsies were taken you will be contacted by phone or by letter within the next 1-3 weeks. Call [phone]  if you have not heard about the biopsies in 3 weeks.  °Please also call with any specific questions about appointments or follow up tests. ° °

## 2020-07-03 NOTE — H&P (Signed)
[facility] History and Physical   Primary Care Physician:  Michael Boston, MD   Reason for Procedure:  Large sessile polyp removed in piecemeal  Plan:    Surveillance colonoscopy     HPI: Ashley Rivera is a 80 y.o. female here for surveillance colonoscopy s/p large sessile polyp removed by piecemeal technique to exclude any residual polypoid tissue. The risks and benefits as well as alternatives of endoscopic procedure(s) have been discussed and reviewed. All questions answered. The patient agrees to proceed.    Past Medical History:  Diagnosis Date  . Allergy   . Asthma    bronchial asthma  . Cataract    left eye  . Complication of anesthesia    Became congested after colonoscopy  . COPD (chronic obstructive pulmonary disease) ([place])   . Diverticulosis   . Emphysema of lung ([place])   . Genital warts    Dx about 30 years ago   . GERD (gastroesophageal reflux disease)   . Hemorrhoids   . History of hiatal hernia   . History of kidney stones   . Hx of colonic polyp   . Hx: UTI (urinary tract infection)   . Osteoporosis   . Pneumonia   . Proctitis 11-12-11  . Skin cancer    basal cell  . Urinary incontinence    On occasion    Past Surgical History:  Procedure Laterality Date  . COLONOSCOPY    . COLONOSCOPY WITH PROPOFOL N/A 10/28/2019   Procedure: COLONOSCOPY WITH PROPOFOL  LARGE CECAL POLYP;  Surgeon: Mauri Pole, MD;  Location[facility];  Service: Endoscopy;  Laterality: N/A;  . CYSTOSCOPY/URETEROSCOPY/HOLMIUM LASER/STENT PLACEMENT Right 11/09/2019   Procedure: CYSTOSCOPY/URETEROSCOPY/HOLMIUM LASER/STENT PLACEMENT;  Surgeon: Festus Aloe, MD;  Location: [facility];  Service: Urology;  Laterality: Right;  . DILATION AND CURETTAGE OF UTERUS     50 years ago. Removed rest of placenta from birth  . ENDOSCOPIC MUCOSAL RESECTION N/A 10/28/2019   Procedure: ENDOSCOPIC MUCOSAL RESECTION;  Surgeon: Mauri Pole, MD;  Location[facility];   Service: Endoscopy;  Laterality: N/A;  . EYE SURGERY     cataract  . HEMOSTASIS CLIP PLACEMENT  10/28/2019   Procedure: HEMOSTASIS CLIP PLACEMENT;  Surgeon: Mauri Pole, MD;  [facility];  Service: Endoscopy;;  . INNER EAR SURGERY Right 2006   Tumor removed   . POLYPECTOMY  10/28/2019   Procedure: POLYPECTOMY;  Surgeon: Mauri Pole, MD;  [facility];  Service: Endoscopy;;  . SUBMUCOSAL LIFTING INJECTION  10/28/2019   Procedure: SUBMUCOSAL LIFTING INJECTION;  Surgeon: Mauri Pole, MD;  [facility];  Service: Endoscopy;;  . SUBMUCOSAL TATTOO INJECTION  10/28/2019   Procedure: SUBMUCOSAL TATTOO INJECTION;  Surgeon: Mauri Pole, MD;  Location: [facility];  Service: Endoscopy;;  . TONSILLECTOMY AND ADENOIDECTOMY  1950    Prior to Admission medications   Medication Sig Start Date End Date Taking? Authorizing Provider  B Complex Vitamins (VITAMIN-B COMPLEX PO) Take 1 tablet by mouth every evening.   Yes [provider]  budesonide-formoterol (SYMBICORT) 160-4.5 MCG/ACT inhaler Inhale 2 puffs into the lungs daily as needed (shortness of breath).    Yes [provider]  calcium carbonate (TUMS - DOSED IN MG ELEMENTAL CALCIUM) 500 MG chewable tablet Chew 1 tablet by mouth as needed for indigestion or heartburn.   Yes [provider]  carboxymethylcellulose (REFRESH PLUS) 0.5 % SOLN Place 2 drops into both eyes 2 (two) times daily as needed (  dry/irritated eyes).   Yes [provider]  Cholecalciferol (VITAMIN D3) 50 MCG (2000 UT) TABS Take 2,000 Units by mouth daily.   Yes [provider]  folic acid (FOLVITE) 803 MCG tablet Take 400 mcg by mouth daily.    Yes [provider]  hydrochlorothiazide (HYDRODIURIL) 12.5 MG tablet Take 12.5 mg by mouth daily. 05/12/20  Yes [provider]  melatonin 3 MG TABS tablet Take 6 mg by mouth at bedtime.   Yes [provider]  Omega-3  Fatty Acids (FISH OIL ULTRA) 1400 MG CAPS Take 1,400 mg by mouth in the morning and at bedtime.   Yes [provider]  Probiotic Product (ALIGN) 4 MG CAPS Take 4 mg by mouth 3 (three) times a week.   Yes [provider]  sulfaSALAzine (AZULFIDINE) 500 MG EC tablet Take 1,000 mg by mouth 2 (two) times daily. 05/09/20  Yes [provider]  SUPREP BOWEL PREP KIT 17.5-3.13-1.6 GM/177ML SOLN Take 354 mLs by mouth as directed. 04/10/20  Yes [provider]  tiotropium (SPIRIVA) 18 MCG inhalation capsule Place 18 mcg into inhaler and inhale daily as needed (asthma).    Yes [provider]  VENTOLIN HFA 108 (90 BASE) MCG/ACT inhaler Inhale 1 puff into the lungs as needed for wheezing or shortness of breath.  11/04/14  Yes [provider]  Wheat Dextrin (BENEFIBER PO) Take 1 Dose by mouth daily.   Yes [provider]  cetirizine (ZYRTEC) 10 MG tablet Take 10 mg by mouth daily as needed for allergies.     [provider]  hydrocortisone (ANUSOL-HC) 25 MG suppository Place 1 suppository (25 mg total) rectally at bedtime. Patient not taking: No sig reported 04/10/20   Mauri Pole, MD    Current Facility-Administered Medications  Medication Dose Route Frequency Provider Last Rate Last Admin  . 0.9 %  sodium chloride infusion   Intravenous Continuous Nandigam, Kavitha V, MD      . lactated ringers infusion    Continuous PRN Harl Bowie V, MD   1,000 mL at 07/03/20 0805    Allergies as of 04/10/2020  . (No Known Allergies)    Family History  Problem Relation Age of Onset  . Lung cancer Mother   . Colon cancer Paternal Grandmother   . Breast cancer Maternal Aunt   . Esophageal cancer Neg Hx   . Rectal cancer Neg Hx   . Stomach cancer Neg Hx   . Pancreatic cancer Neg Hx     Social History   Socioeconomic History  . Marital status: Married    Spouse name: Not on file  . Number of children: 2  . Years of education:  Not on file  . Highest education level: Not on file  Occupational History  . Occupation: Retired   Tobacco Use  . Smoking status: Former Smoker    Years: 30.00    Types: Cigarettes    Quit date: 10/05/2007    Years since quitting: 12.7  . Smokeless tobacco: Never Used  Vaping Use  . Vaping Use: Never used  Substance and Sexual Activity  . Alcohol use: Yes    Alcohol/week: 2.0 standard drinks    Types: 2 Glasses of wine per week    Comment: once a week  . Drug use: No  . Sexual activity: Not Currently    Partners: Male    Birth control/protection: Post-menopausal  Other Topics Concern  . Not on file  Social History Narrative  Daily caffeine    Social Determinants of Health   Financial Resource Strain: Not on file  Food Insecurity: Not on file  Transportation Needs: Not on file  Physical Activity: Not on file  Stress: Not on file  Social Connections: Not on file  Intimate Partner Violence: Not on file    Review of Systems:  All other review of systems negative except as mentioned in the HPI.  Physical Exam: Vital signs in last 24 hours: Temp:  [98.9 F (37.2 C)] 98.9 F (37.2 C) (02/28 0755) Resp:  [22] 22 (02/28 0755) BP: (173)/(73) 173/73 (02/28 0755) SpO2:  [95 %] 95 % (02/28 0755) Weight:  [65.3 kg] 65.3 kg (02/28 0755)   General:   Alert, NAD Lungs:  Clear .   Heart:  Regular rate and rhythm Abdomen:  Soft, nontender and nondistended. Neuro/Psych:  Alert and cooperative. Normal mood and affect. A and O x 3   K. Denzil Magnuson , MD [phone]

## 2020-07-03 NOTE — Op Note (Signed)
[facility] Patient Name: Ashley Rivera Procedure Date: 07/03/2020 MRN: [id number] Attending MD: Mauri Pole , MD Date of Birth: [birth year] CSN: [id number] Age: 80 Admit Type: Outpatient Procedure:                Colonoscopy Indications:              Surveillance: Piecemeal removal of large sessile                            adenoma last colonoscopy (< 3 yrs), High risk colon                            cancer surveillance: Personal history of adenoma                            (10 mm or greater in size) Providers:                Mauri Pole, MD, Particia Nearing, RN, Faustina                            Mbumina, Technician Referring MD:              Medicines:                Monitored Anesthesia Care Complications:            No immediate complications. Estimated Blood Loss:     Estimated blood loss was minimal. Procedure:                After obtaining informed consent, the colonoscope                            was passed under direct vision. Throughout the                            procedure, the patient's blood pressure, pulse, and                            oxygen saturations were monitored continuously. The                            [id number]-H190DL ([id number] Olympus pediatric colonscope                            was introduced through the anus and advanced to the                            the cecum, identified by the appendiceal orifice.                            The colonoscopy was performed without difficulty.                            The patient tolerated the procedure well. The  quality of the bowel preparation was adequate. The                            ileocecal valve, appendiceal orifice, and rectum                            were photographed. Scope In: 8:55:25 AM Scope Out: 9:17:37 AM Scope Withdrawal Time: 0 hours 14 minutes 41 seconds  Total Procedure Duration: 0 hours 22 minutes 12 seconds  Findings:      The  perianal and digital rectal examinations were normal.      A less than 1 mm polyp was found in the cecum. The polyp was sessile.       The polyp was removed with a cold biopsy forceps. Resection and       retrieval were complete.      Three sessile polyps were found in the rectum, sigmoid colon and       transverse colon. The polyps were 4 to 6 mm in size. These polyps were       removed with a cold snare. Resection and retrieval were complete.      Scattered small and large-mouthed diverticula were found in the sigmoid       colon, descending colon, transverse colon and ascending colon. There was       evidence of diverticular spasm. There was no evidence of diverticular       bleeding.      Prolapsed external and internal hemorrhoids were found during       retroflexion. The hemorrhoids were large with ulceration in the left       lateral hemorrhoid. Impression:               - One less than 1 mm polyp in the cecum, removed                            with a cold biopsy forceps. Resected and retrieved.                           - Three 4 to 6 mm polyps in the rectum, in the                            sigmoid colon and in the transverse colon, removed                            with a cold snare. Resected and retrieved.                           - Severe diverticulosis in the sigmoid colon, in                            the descending colon, in the transverse colon and                            in the ascending colon. There was evidence of  diverticular spasm. There was no evidence of                            diverticular bleeding.                           - Prolapsed external and internal hemorrhoids. Moderate Sedation:      Not Applicable - Patient had care per Anesthesia. Recommendation:           - Patient has a contact number available for                            emergencies. The signs and symptoms of potential                            delayed  complications were discussed with the                            patient. Return to normal activities tomorrow.                            Written discharge instructions were provided to the                            patient.                           - Resume previous diet.                           - Continue present medications.                           - Await pathology results.                           - No repeat colonoscopy due to age.                           - Use Benefiber one teaspoon PO TID.                           - Use hydrocortisone suppository 25 mg 1 per rectum                            twice a day for 5-7 days. Procedure Code(s):        --- Professional ---                           [phone], Colonoscopy, flexible; with removal of                            tumor(s), polyp(s), or other lesion(s) by snare                            technique  33435, 47, Colonoscopy, flexible; with biopsy,                            single or multiple Diagnosis Code(s):        --- Professional ---                           K63.5, Polyp of colon                           K62.1, Rectal polyp                           K64.8, Other hemorrhoids                           Z86.010, Personal history of colonic polyps                           K57.30, Diverticulosis of large intestine without                            perforation or abscess without bleeding CPT copyright 2019 [employer]. All rights reserved. The codes documented in this report are preliminary and upon coder review may  be revised to meet current compliance requirements. Mauri Pole, MD 07/03/2020 9:33:14 AM This report has been signed electronically. Number of Addenda: 0

## 2020-07-04 ENCOUNTER — Encounter: Payer: Self-pay | Admitting: Gastroenterology

## 2020-07-04 LAB — SURGICAL PATHOLOGY

## 2020-07-10 DIAGNOSIS — D329 Benign neoplasm of meninges, unspecified: Secondary | ICD-10-CM | POA: Diagnosis not present

## 2020-07-16 ENCOUNTER — Ambulatory Visit (HOSPITAL_COMMUNITY)
Admission: EM | Admit: 2020-07-16 | Discharge: 2020-07-16 | Disposition: A | Discharge status: Home / Self Care | Payer: PPO | Attending: Emergency Medicine | Admitting: Emergency Medicine

## 2020-07-16 ENCOUNTER — Encounter (HOSPITAL_COMMUNITY): Payer: Self-pay

## 2020-07-16 ENCOUNTER — Other Ambulatory Visit: Payer: Self-pay

## 2020-07-16 DIAGNOSIS — R300 Dysuria: Secondary | ICD-10-CM | POA: Diagnosis not present

## 2020-07-16 DIAGNOSIS — R1030 Lower abdominal pain, unspecified: Secondary | ICD-10-CM

## 2020-07-16 DIAGNOSIS — R350 Frequency of micturition: Secondary | ICD-10-CM | POA: Diagnosis not present

## 2020-07-16 LAB — POCT URINALYSIS DIPSTICK, ED / UC
Glucose, UA: NEGATIVE mg/dL
Hgb urine dipstick: NEGATIVE
Leukocytes,Ua: NEGATIVE
Nitrite: NEGATIVE
Protein, ur: NEGATIVE mg/dL
Specific Gravity, Urine: 1.025 (ref 1.005–1.030)
Urobilinogen, UA: 0.2 mg/dL (ref 0.0–1.0)
pH: 6 (ref 5.0–8.0)

## 2020-07-16 MED ORDER — CEPHALEXIN 500 MG PO CAPS: 500.0000 mg | ORAL_CAPSULE | Freq: Two times a day (BID) | ORAL | 0 refills | Status: AC

## 2020-07-16 NOTE — Discharge Instructions (Signed)
Take the antibiotic as directed.  The urine culture is pending.  We will call you if it shows the need to change or discontinue your antibiotic.    Follow up with your primary care provider if your symptoms are not improving.    

## 2020-07-16 NOTE — ED Provider Notes (Signed)
[place]    CSN: [id number] Arrival date & time: 07/16/20  1106      History   Chief Complaint Chief Complaint  Patient presents with  . Urinary Frequency    HPI Ashley Rivera is a 80 y.o. female.   Patient presents with dysuria, urinary frequency, lower abdominal pressure, low back pain x1 week.  She took an at home urine test for UTI and it was positive.  She denies fever, chills, hematuria, vomiting, diarrhea, or other symptoms.  Patient took 1 dose of expired Bactrim last night.  Her medical history includes diverticulosis, COPD, emphysema, asthma, GERD.  The history is provided by the patient and medical records.    Past Medical History:  Diagnosis Date  . Allergy   . Asthma    bronchial asthma  . Cataract    left eye  . Complication of anesthesia    Became congested after colonoscopy  . COPD (chronic obstructive pulmonary disease) ([place])   . Diverticulosis   . Emphysema of lung ([place])   . Genital warts    Dx about 30 years ago   . GERD (gastroesophageal reflux disease)   . Hemorrhoids   . History of hiatal hernia   . History of kidney stones   . Hx of colonic polyp   . Hx: UTI (urinary tract infection)   . Osteoporosis   . Pneumonia   . Proctitis 11-12-11  . Skin cancer    basal cell  . Urinary incontinence    On occasion    Patient Active Problem List   Diagnosis Date Noted  . Polyp of rectum   . Chronic obstructive pulmonary disease ([place]) 01/28/2020  . Coronary artery calcification 01/28/2020  . DOE (dyspnea on exertion) 01/28/2020  . Polyp of cecum   . GERD (gastroesophageal reflux disease) 01/17/2015  . Pruritus ani 12/04/2011  . Proctitis 12/04/2011  . Personal history of colonic polyps 10/22/2011  . Change in bowel habits 10/22/2011  . Internal hemorrhoids without mention of complication 62/94/7654    Past Surgical History:  Procedure Laterality Date  . BIOPSY  07/03/2020   Procedure: BIOPSY;  Surgeon: Mauri Pole, MD;   Location: [facility];  Service: Endoscopy;;  . COLONOSCOPY    . COLONOSCOPY WITH PROPOFOL N/A 10/28/2019   Procedure: COLONOSCOPY WITH PROPOFOL  LARGE CECAL POLYP;  Surgeon: Mauri Pole, MD;  Location: [facility];  Service: Endoscopy;  Laterality: N/A;  . COLONOSCOPY WITH PROPOFOL N/A 07/03/2020   Procedure: COLONOSCOPY WITH PROPOFOL;  Surgeon: Mauri Pole, MD;  Location: [facility];  Service: Endoscopy;  Laterality: N/A;  . CYSTOSCOPY/URETEROSCOPY/HOLMIUM LASER/STENT PLACEMENT Right 11/09/2019   Procedure: CYSTOSCOPY/URETEROSCOPY/HOLMIUM LASER/STENT PLACEMENT;  Surgeon: Festus Aloe, MD;  Location: [facility];  Service: Urology;  Laterality: Right;  . DILATION AND CURETTAGE OF UTERUS     50 years ago. Removed rest of placenta from birth  . ENDOSCOPIC MUCOSAL RESECTION N/A 10/28/2019   Procedure: ENDOSCOPIC MUCOSAL RESECTION;  Surgeon: Mauri Pole, MD;  Location: [facility];  Service: Endoscopy;  Laterality: N/A;  . EYE SURGERY     cataract  . HEMOSTASIS CLIP PLACEMENT  10/28/2019   Procedure: HEMOSTASIS CLIP PLACEMENT;  Surgeon: Mauri Pole, MD;  Location: [facility];  Service: Endoscopy;;  . INNER EAR SURGERY Right 2006   Tumor removed   . POLYPECTOMY  10/28/2019   Procedure: POLYPECTOMY;  Surgeon: Mauri Pole, MD;  Location: [facility];  Service: Endoscopy;;  . POLYPECTOMY  07/03/2020  Procedure: POLYPECTOMY;  Surgeon: Mauri Pole, MD;  Location: [facility];  Service: Endoscopy;;  . SUBMUCOSAL LIFTING INJECTION  10/28/2019   Procedure: SUBMUCOSAL LIFTING INJECTION;  Surgeon: Mauri Pole, MD;  Location: [facility];  Service: Endoscopy;;  . SUBMUCOSAL TATTOO INJECTION  10/28/2019   Procedure: SUBMUCOSAL TATTOO INJECTION;  Surgeon: Mauri Pole, MD;  Location: [facility];  Service: Endoscopy;;  . TONSILLECTOMY AND ADENOIDECTOMY  1950    OB History    Gravida  2   Para  2   Term  2   Preterm      AB       Living  2     SAB      IAB      Ectopic      Multiple      Live Births  2            Home Medications    Prior to Admission medications   Medication Sig Start Date End Date Taking? Authorizing Provider  cephALEXin (KEFLEX) 500 MG capsule Take 1 capsule (500 mg total) by mouth 2 (two) times daily for 5 days. 07/16/20 07/21/20 Yes Sharion Balloon, NP  B Complex Vitamins (VITAMIN-B COMPLEX PO) Take 1 tablet by mouth every evening.    [provider]  budesonide-formoterol (SYMBICORT) 160-4.5 MCG/ACT inhaler Inhale 2 puffs into the lungs daily as needed (shortness of breath).     [provider]  calcium carbonate (TUMS - DOSED IN MG ELEMENTAL CALCIUM) 500 MG chewable tablet Chew 1 tablet by mouth as needed for indigestion or heartburn.    [provider]  carboxymethylcellulose (REFRESH PLUS) 0.5 % SOLN Place 2 drops into both eyes 2 (two) times daily as needed (dry/irritated eyes).    [provider]  cetirizine (ZYRTEC) 10 MG tablet Take 10 mg by mouth daily as needed for allergies.     [provider]  Cholecalciferol (VITAMIN D3) 50 MCG (2000 UT) TABS Take 2,000 Units by mouth daily.    [provider]  folic acid (FOLVITE) 347 MCG tablet Take 400 mcg by mouth daily.     [provider]  hydrochlorothiazide (HYDRODIURIL) 12.5 MG tablet Take 12.5 mg by mouth daily. 05/12/20   [provider]  hydrocortisone (ANUSOL-HC) 2.5 % rectal cream Place 1 application rectally 2 (two) times daily. 07/03/20   Mauri Pole, MD  hydrocortisone (ANUSOL-HC) 25 MG suppository Place 1 suppository (25 mg total) rectally at bedtime. Patient not taking: No sig reported 04/10/20   Mauri Pole, MD  melatonin 3 MG TABS tablet Take 6 mg by mouth at bedtime.    [provider]  Omega-3 Fatty Acids (FISH OIL ULTRA) 1400 MG CAPS Take 1,400 mg by mouth in the morning and at bedtime.    [provider]  Probiotic  Product (ALIGN) 4 MG CAPS Take 4 mg by mouth 3 (three) times a week.    [provider]  sulfaSALAzine (AZULFIDINE) 500 MG EC tablet Take 1,000 mg by mouth 2 (two) times daily. 05/09/20   [provider]  tiotropium (SPIRIVA) 18 MCG inhalation capsule Place 18 mcg into inhaler and inhale daily as needed (asthma).     [provider]  VENTOLIN HFA 108 (90 BASE) MCG/ACT inhaler Inhale 1 puff into the lungs as needed for wheezing or shortness of breath.  11/04/14   [provider]  Wheat Dextrin (BENEFIBER PO) Take 1 Dose by mouth daily.    [provider]    Family History Family History  Problem Relation Age of Onset  . Lung cancer Mother   . Colon cancer Paternal Grandmother   . Breast cancer Maternal Aunt   . Esophageal cancer Neg Hx   . Rectal cancer Neg Hx   . Stomach cancer Neg Hx   . Pancreatic cancer Neg Hx     Social History Social History   Tobacco Use  . Smoking status: Former Smoker    Years: 30.00    Types: Cigarettes    Quit date: 10/05/2007    Years since quitting: 12.7  . Smokeless tobacco: Never Used  Vaping Use  . Vaping Use: Never used  Substance Use Topics  . Alcohol use: Yes    Alcohol/week: 2.0 standard drinks    Types: 2 Glasses of wine per week    Comment: once a week  . Drug use: No     Allergies   Patient has no known allergies.   Review of Systems Review of Systems  Constitutional: Negative for chills and fever.  HENT: Negative for ear pain and sore throat.   Eyes: Negative for pain and visual disturbance.  Respiratory: Negative for cough and shortness of breath.   Cardiovascular: Negative for chest pain and palpitations.  Gastrointestinal: Positive for abdominal pain. Negative for diarrhea, nausea and vomiting.  Genitourinary: Positive for dysuria and frequency. Negative for hematuria.  Musculoskeletal: Positive for back pain. Negative for arthralgias.  Skin: Negative for color change and rash.   Neurological: Negative for seizures and syncope.  All other systems reviewed and are negative.    Physical Exam Triage Vital Signs ED Triage Vitals  Enc Vitals Group     BP      Pulse      Resp      Temp      Temp src      SpO2      Weight      Height      Head Circumference      Peak Flow      Pain Score      Pain Loc      Pain Edu?      Excl. in [place]?    No data found.  Updated Vital Signs BP (!) 142/73 (BP Location: Left Arm)   Pulse 94   Temp 97.7 F (36.5 C) (Oral)   Resp 18   SpO2 95%   Visual Acuity Right Eye Distance:   Left Eye Distance:   Bilateral Distance:    Right Eye Near:   Left Eye Near:    Bilateral Near:     Physical Exam Vitals and nursing note reviewed.  Constitutional:      General: She is not in acute distress.    Appearance: She is well-developed. She is not ill-appearing.  HENT:     Head: Normocephalic and atraumatic.     Mouth/Throat:     Mouth: Mucous membranes are moist.  Eyes:     Conjunctiva/sclera: Conjunctivae normal.  Cardiovascular:     Rate and Rhythm: Normal rate and regular rhythm.     Heart sounds: Normal heart sounds.  Pulmonary:     Effort: Pulmonary effort is normal. No respiratory distress.     Breath sounds: Normal breath sounds.  Abdominal:     Palpations: Abdomen is soft.     Tenderness: There is abdominal tenderness in the suprapubic area. There is no right CVA tenderness, left CVA tenderness, guarding or rebound.  Musculoskeletal:     Cervical back: Neck supple.  Skin:    General: Skin is warm and dry.  Neurological:     General: No focal deficit present.     Mental Status: She is alert and oriented to person, place, and time.     Gait: Gait normal.  Psychiatric:        Mood and Affect: Mood normal.        Behavior: Behavior normal.      UC Treatments / Results  Labs (all labs ordered are listed, but only abnormal results are displayed) Labs Reviewed  POCT URINALYSIS DIPSTICK, ED / UC -  Abnormal; Notable for the following components:      Result Value   Bilirubin Urine SMALL (*)    Ketones, ur TRACE (*)    All other components within normal limits  URINE CULTURE    EKG   Radiology No results found.  Procedures Procedures (including critical care time)  Medications Ordered in UC Medications - No data to display  Initial Impression / Assessment and Plan / UC Course  I have reviewed the triage vital signs and the nursing notes.  Pertinent labs & imaging results that were available during my care of the patient were reviewed by me and considered in my medical decision making (see chart for details).   Dysuria, lower abdominal pain.  Urine culture pending.  Patient took 1 dose of antibiotic last night.  Treating with Keflex.  Discussed with patient that we will call her if the urine culture shows the need to change or discontinue her antibiotic.  Instructed her to follow-up with her PCP if her symptoms are not improving.  She agrees to plan of care.   Final Clinical Impressions(s) / UC Diagnoses   Final diagnoses:  Dysuria  Lower abdominal pain     Discharge Instructions     Take the antibiotic as directed.  The urine culture is pending.  We will call you if it shows the need to change or discontinue your antibiotic.    Follow up with your primary care provider if your symptoms are not improving.        ED Prescriptions    Medication Sig Dispense Auth. Provider   cephALEXin (KEFLEX) 500 MG capsule Take 1 capsule (500 mg total) by mouth 2 (two) times daily for 5 days. 10 capsule Sharion Balloon, NP     PDMP not reviewed this encounter.   Sharion Balloon, NP 07/16/20 1218

## 2020-07-16 NOTE — ED Triage Notes (Signed)
Pt present urinary frequency with lower abdomen and back pain. Symptom started a week ago. Pt also states she took a at home uti and it was positive

## 2020-07-17 LAB — URINE CULTURE

## 2020-07-27 ENCOUNTER — Other Ambulatory Visit (HOSPITAL_COMMUNITY): Payer: PPO

## 2020-10-23 ENCOUNTER — Other Ambulatory Visit: Payer: Self-pay | Admitting: Gastroenterology

## 2020-11-30 DIAGNOSIS — D511 Vitamin B12 deficiency anemia due to selective vitamin B12 malabsorption with proteinuria: Secondary | ICD-10-CM | POA: Diagnosis not present

## 2020-11-30 DIAGNOSIS — E785 Hyperlipidemia, unspecified: Secondary | ICD-10-CM | POA: Diagnosis not present

## 2020-11-30 DIAGNOSIS — E559 Vitamin D deficiency, unspecified: Secondary | ICD-10-CM | POA: Diagnosis not present

## 2020-12-15 DIAGNOSIS — H52203 Unspecified astigmatism, bilateral: Secondary | ICD-10-CM | POA: Diagnosis not present

## 2020-12-15 DIAGNOSIS — Z961 Presence of intraocular lens: Secondary | ICD-10-CM | POA: Diagnosis not present

## 2020-12-15 DIAGNOSIS — H16232 Neurotrophic keratoconjunctivitis, left eye: Secondary | ICD-10-CM | POA: Diagnosis not present

## 2020-12-28 DIAGNOSIS — N130 Hydronephrosis with ureteropelvic junction obstruction: Secondary | ICD-10-CM | POA: Diagnosis not present

## 2020-12-28 DIAGNOSIS — N200 Calculus of kidney: Secondary | ICD-10-CM | POA: Diagnosis not present

## 2021-01-12 DIAGNOSIS — J069 Acute upper respiratory infection, unspecified: Secondary | ICD-10-CM | POA: Diagnosis not present

## 2021-01-12 DIAGNOSIS — I10 Essential (primary) hypertension: Secondary | ICD-10-CM | POA: Diagnosis not present

## 2021-01-12 DIAGNOSIS — R051 Acute cough: Secondary | ICD-10-CM | POA: Diagnosis not present

## 2021-01-12 DIAGNOSIS — R0781 Pleurodynia: Secondary | ICD-10-CM | POA: Diagnosis not present

## 2021-01-12 DIAGNOSIS — J449 Chronic obstructive pulmonary disease, unspecified: Secondary | ICD-10-CM | POA: Diagnosis not present

## 2021-01-12 DIAGNOSIS — K219 Gastro-esophageal reflux disease without esophagitis: Secondary | ICD-10-CM | POA: Diagnosis not present

## 2021-02-01 DIAGNOSIS — S81801A Unspecified open wound, right lower leg, initial encounter: Secondary | ICD-10-CM | POA: Diagnosis not present

## 2021-02-01 DIAGNOSIS — I872 Venous insufficiency (chronic) (peripheral): Secondary | ICD-10-CM | POA: Diagnosis not present

## 2021-02-01 DIAGNOSIS — I10 Essential (primary) hypertension: Secondary | ICD-10-CM | POA: Diagnosis not present

## 2021-02-26 DIAGNOSIS — Z1231 Encounter for screening mammogram for malignant neoplasm of breast: Secondary | ICD-10-CM | POA: Diagnosis not present

## 2021-02-28 DIAGNOSIS — L570 Actinic keratosis: Secondary | ICD-10-CM | POA: Diagnosis not present

## 2021-02-28 DIAGNOSIS — Z85828 Personal history of other malignant neoplasm of skin: Secondary | ICD-10-CM | POA: Diagnosis not present

## 2021-02-28 DIAGNOSIS — L218 Other seborrheic dermatitis: Secondary | ICD-10-CM | POA: Diagnosis not present

## 2021-03-05 DIAGNOSIS — I10 Essential (primary) hypertension: Secondary | ICD-10-CM | POA: Diagnosis not present

## 2021-03-05 DIAGNOSIS — J449 Chronic obstructive pulmonary disease, unspecified: Secondary | ICD-10-CM | POA: Diagnosis not present

## 2021-03-05 DIAGNOSIS — M810 Age-related osteoporosis without current pathological fracture: Secondary | ICD-10-CM | POA: Diagnosis not present

## 2021-03-05 DIAGNOSIS — E785 Hyperlipidemia, unspecified: Secondary | ICD-10-CM | POA: Diagnosis not present

## 2021-03-22 DIAGNOSIS — Z1152 Encounter for screening for COVID-19: Secondary | ICD-10-CM | POA: Diagnosis not present

## 2021-03-22 DIAGNOSIS — R0981 Nasal congestion: Secondary | ICD-10-CM | POA: Diagnosis not present

## 2021-03-22 DIAGNOSIS — N390 Urinary tract infection, site not specified: Secondary | ICD-10-CM | POA: Diagnosis not present

## 2021-03-22 DIAGNOSIS — J441 Chronic obstructive pulmonary disease with (acute) exacerbation: Secondary | ICD-10-CM | POA: Diagnosis not present

## 2021-03-22 DIAGNOSIS — G479 Sleep disorder, unspecified: Secondary | ICD-10-CM | POA: Diagnosis not present

## 2021-03-22 DIAGNOSIS — R058 Other specified cough: Secondary | ICD-10-CM | POA: Diagnosis not present

## 2021-03-22 DIAGNOSIS — N3090 Cystitis, unspecified without hematuria: Secondary | ICD-10-CM | POA: Diagnosis not present

## 2021-03-22 DIAGNOSIS — J029 Acute pharyngitis, unspecified: Secondary | ICD-10-CM | POA: Diagnosis not present

## 2021-04-04 DIAGNOSIS — E785 Hyperlipidemia, unspecified: Secondary | ICD-10-CM | POA: Diagnosis not present

## 2021-04-04 DIAGNOSIS — I10 Essential (primary) hypertension: Secondary | ICD-10-CM | POA: Diagnosis not present

## 2021-04-04 DIAGNOSIS — M810 Age-related osteoporosis without current pathological fracture: Secondary | ICD-10-CM | POA: Diagnosis not present

## 2021-04-04 DIAGNOSIS — J449 Chronic obstructive pulmonary disease, unspecified: Secondary | ICD-10-CM | POA: Diagnosis not present

## 2021-04-10 DIAGNOSIS — J449 Chronic obstructive pulmonary disease, unspecified: Secondary | ICD-10-CM | POA: Diagnosis not present

## 2021-04-10 DIAGNOSIS — E785 Hyperlipidemia, unspecified: Secondary | ICD-10-CM | POA: Diagnosis not present

## 2021-04-10 DIAGNOSIS — Z1331 Encounter for screening for depression: Secondary | ICD-10-CM | POA: Diagnosis not present

## 2021-04-10 DIAGNOSIS — M810 Age-related osteoporosis without current pathological fracture: Secondary | ICD-10-CM | POA: Diagnosis not present

## 2021-04-10 DIAGNOSIS — D649 Anemia, unspecified: Secondary | ICD-10-CM | POA: Diagnosis not present

## 2021-04-10 DIAGNOSIS — R0981 Nasal congestion: Secondary | ICD-10-CM | POA: Diagnosis not present

## 2021-04-10 DIAGNOSIS — Z1339 Encounter for screening examination for other mental health and behavioral disorders: Secondary | ICD-10-CM | POA: Diagnosis not present

## 2021-04-10 DIAGNOSIS — Z87891 Personal history of nicotine dependence: Secondary | ICD-10-CM | POA: Diagnosis not present

## 2021-04-10 DIAGNOSIS — Z0000 Encounter for general adult medical examination without abnormal findings: Secondary | ICD-10-CM | POA: Diagnosis not present

## 2021-04-10 DIAGNOSIS — I700 Atherosclerosis of aorta: Secondary | ICD-10-CM | POA: Diagnosis not present

## 2021-04-10 DIAGNOSIS — I10 Essential (primary) hypertension: Secondary | ICD-10-CM | POA: Diagnosis not present

## 2021-04-10 DIAGNOSIS — I872 Venous insufficiency (chronic) (peripheral): Secondary | ICD-10-CM | POA: Diagnosis not present

## 2021-04-10 DIAGNOSIS — D692 Other nonthrombocytopenic purpura: Secondary | ICD-10-CM | POA: Diagnosis not present

## 2021-04-10 DIAGNOSIS — K5120 Ulcerative (chronic) proctitis without complications: Secondary | ICD-10-CM | POA: Diagnosis not present

## 2021-04-16 ENCOUNTER — Other Ambulatory Visit: Payer: Self-pay | Admitting: Internal Medicine

## 2021-04-16 DIAGNOSIS — M810 Age-related osteoporosis without current pathological fracture: Secondary | ICD-10-CM

## 2021-04-23 ENCOUNTER — Other Ambulatory Visit: Payer: Self-pay

## 2021-04-23 DIAGNOSIS — D649 Anemia, unspecified: Secondary | ICD-10-CM | POA: Insufficient documentation

## 2021-04-23 DIAGNOSIS — I700 Atherosclerosis of aorta: Secondary | ICD-10-CM | POA: Insufficient documentation

## 2021-04-23 DIAGNOSIS — I10 Essential (primary) hypertension: Secondary | ICD-10-CM | POA: Insufficient documentation

## 2021-04-23 DIAGNOSIS — S81801A Unspecified open wound, right lower leg, initial encounter: Secondary | ICD-10-CM | POA: Insufficient documentation

## 2021-04-25 ENCOUNTER — Ambulatory Visit: Payer: PPO | Admitting: Physician Assistant

## 2021-04-25 ENCOUNTER — Other Ambulatory Visit (INDEPENDENT_AMBULATORY_CARE_PROVIDER_SITE_OTHER): Payer: PPO

## 2021-04-25 ENCOUNTER — Encounter: Payer: Self-pay | Admitting: Physician Assistant

## 2021-04-25 VITALS — BP 122/70 | HR 94 | Ht 62.5 in | Wt 142.0 lb

## 2021-04-25 DIAGNOSIS — K649 Unspecified hemorrhoids: Secondary | ICD-10-CM | POA: Diagnosis not present

## 2021-04-25 DIAGNOSIS — K219 Gastro-esophageal reflux disease without esophagitis: Secondary | ICD-10-CM

## 2021-04-25 DIAGNOSIS — K625 Hemorrhage of anus and rectum: Secondary | ICD-10-CM

## 2021-04-25 DIAGNOSIS — D649 Anemia, unspecified: Secondary | ICD-10-CM | POA: Diagnosis not present

## 2021-04-25 LAB — CBC WITH DIFFERENTIAL/PLATELET
Basophils Absolute: 0.1 10*3/uL (ref 0.0–0.1)
Basophils Relative: 1 % (ref 0.0–3.0)
Eosinophils Absolute: 0.1 10*3/uL (ref 0.0–0.7)
Eosinophils Relative: 1.9 % (ref 0.0–5.0)
HCT: 39.6 % (ref 36.0–46.0)
Hemoglobin: 13.1 g/dL (ref 12.0–15.0)
Lymphocytes Relative: 18.7 % (ref 12.0–46.0)
Lymphs Abs: 1.4 10*3/uL (ref 0.7–4.0)
MCHC: 33 g/dL (ref 30.0–36.0)
MCV: 89.1 fl (ref 78.0–100.0)
Monocytes Absolute: 0.7 10*3/uL (ref 0.1–1.0)
Monocytes Relative: 9.8 % (ref 3.0–12.0)
Neutro Abs: 5.3 10*3/uL (ref 1.4–7.7)
Neutrophils Relative %: 68.6 % (ref 43.0–77.0)
Platelets: 165 10*3/uL (ref 150.0–400.0)
RBC: 4.44 Mil/uL (ref 3.87–5.11)
RDW: 14.2 % (ref 11.5–15.5)
WBC: 7.7 10*3/uL (ref 4.0–10.5)

## 2021-04-25 LAB — IBC + FERRITIN
Ferritin: 11.7 ng/mL (ref 10.0–291.0)
Iron: 62 ug/dL (ref 42–145)
Saturation Ratios: 15.5 % — ABNORMAL LOW (ref 20.0–50.0)
TIBC: 400.4 ug/dL (ref 250.0–450.0)
Transferrin: 286 mg/dL (ref 212.0–360.0)

## 2021-04-25 NOTE — Progress Notes (Signed)
Chief Complaint: Anemia, rectal bleeding and GERD  HPI:    Mrs. Ashley Rivera is an 80 year old female with past medical history of COPD, emphysema and multiple others, known to Dr. Silverio Decamp, who was referred to me by Michael Boston, MD for a complaint of anemia, rectal bleeding and GERD.    04/10/2020 patient seen in clinic for history of colon polyps and rectal bleeding.  At that time started on Anusol suppositories and recommended a colonoscopy.    07/03/2020 colonoscopy with 1 less than 1 mm polyp in the cecum, 3 4-6 mm polyps in the rectum, sigmoid colon and transverse colon as well as severe diverticulosis in the sigmoid, descending, transverse and ascending colon.  Evidence of diverticular spasm.  Prolapsed external and internal hemorrhoids.  Patient was given more Hydrocortisone suppositories and told she did not need repeat colonoscopy due to age.    Today, the patient tells me that she has multiple problems.  Apparently was told back in July that she was anemic and told to start oral iron therapy.  She did this for about 6 months and had follow-up labs early December and was told she could stop her iron but that she still needed to follow-up with [place] in regards to this anemia.  Patient tells me she is fatigued but "I am 80 years old", this is not new for her.  Also tells me she has some shortness of breath but "I have COPD and emphysema".  Tells me nothing is new as far as those symptoms.    Does tell me she battles with epigastric pain and reflux symptoms on an almost daily basis for which she uses Tums.  Apparently was prescribed Pantoprazole 40 mg daily but never started this.    Also describes bleeding when she has a bowel movement, this is typically just when she wipes on the toilet paper over the past 4 to 5 months.  As well as some increase in rectal pressure recently.  Tells me that initially after time of colonoscopy after using the suppositories all the symptoms went away, but they have slowly  come back and are intermittent now.  Think she needs treatment for her hemorrhoids again.    Denies fever, chills, weight loss, nausea, vomiting or symptoms that awaken her from sleep.  Past Medical History:  Diagnosis Date   Allergy    Asthma    bronchial asthma   Cataract    left eye   Complication of anesthesia    Became congested after colonoscopy   COPD (chronic obstructive pulmonary disease) (HCC)    Diverticulosis    Emphysema of lung (HCC)    Genital warts    Dx about 30 years ago    GERD (gastroesophageal reflux disease)    Hemorrhoids    History of hiatal hernia    History of kidney stones    Hx of colonic polyp    Hx: UTI (urinary tract infection)    Osteoporosis    Pneumonia    Proctitis 11-12-11   Skin cancer    basal cell   Urinary incontinence    On occasion    Past Surgical History:  Procedure Laterality Date   BIOPSY  07/03/2020   Procedure: BIOPSY;  Surgeon: Mauri Pole, MD;  [facility]: [facility];  Service: Endoscopy;;   COLONOSCOPY     COLONOSCOPY WITH PROPOFOL N/A 10/28/2019   Procedure: COLONOSCOPY WITH PROPOFOL  LARGE CECAL POLYP;  Surgeon: Mauri Pole, MD;  [facility];  Service: Endoscopy;  Laterality: N/A;   COLONOSCOPY WITH PROPOFOL N/A 07/03/2020   Procedure: COLONOSCOPY WITH PROPOFOL;  Surgeon: Mauri Pole, MD;  [facility];  Service: Endoscopy;  Laterality: N/A;   CYSTOSCOPY/URETEROSCOPY/HOLMIUM LASER/STENT PLACEMENT Right 11/09/2019   Procedure: CYSTOSCOPY/URETEROSCOPY/HOLMIUM LASER/STENT PLACEMENT;  Surgeon: Festus Aloe, MD;  Location: [facility];  Service: Urology;  Laterality: Right;   DILATION AND CURETTAGE OF UTERUS     50 years ago. Removed rest of placenta from birth   [place] N/A 10/28/2019   Procedure: ENDOSCOPIC MUCOSAL RESECTION;  Surgeon: Mauri Pole, MD;  [facility];  Service: Endoscopy;  Laterality: N/A;   EYE SURGERY     cataract   HEMOSTASIS  CLIP PLACEMENT  10/28/2019   Procedure: HEMOSTASIS CLIP PLACEMENT;  Surgeon: Mauri Pole, MD;  [facility];  Service: Endoscopy;;   INNER EAR SURGERY Right 2006   Tumor removed    POLYPECTOMY  10/28/2019   Procedure: POLYPECTOMY;  Surgeon: Mauri Pole, MD;  [facility];  Service: Endoscopy;;   POLYPECTOMY  07/03/2020   Procedure: POLYPECTOMY;  Surgeon: Mauri Pole, MD;  [facility];  Service: Endoscopy;;   SUBMUCOSAL LIFTING INJECTION  10/28/2019   Procedure: SUBMUCOSAL LIFTING INJECTION;  Surgeon: Mauri Pole, MD;  [facility];  Service: Endoscopy;;   SUBMUCOSAL TATTOO INJECTION  10/28/2019   Procedure: SUBMUCOSAL TATTOO INJECTION;  Surgeon: Mauri Pole, MD;  [facility];  Service: Endoscopy;;   TONSILLECTOMY AND ADENOIDECTOMY  1950    Current Outpatient Medications  Medication Sig Dispense Refill   B Complex Vitamins (VITAMIN-B COMPLEX PO) Take 1 tablet by mouth every evening.     budesonide-formoterol (SYMBICORT) 160-4.5 MCG/ACT inhaler Inhale 2 puffs into the lungs daily as needed (shortness of breath).      calcium carbonate (TUMS - DOSED IN MG ELEMENTAL CALCIUM) 500 MG chewable tablet Chew 1 tablet by mouth as needed for indigestion or heartburn.     carboxymethylcellulose (REFRESH PLUS) 0.5 % SOLN Place 2 drops into both eyes 2 (two) times daily as needed (dry/irritated eyes).     cetirizine (ZYRTEC) 10 MG tablet Take 10 mg by mouth daily as needed for allergies.      Cholecalciferol (VITAMIN D3) 50 MCG (2000 UT) TABS Take 2,000 Units by mouth daily.     folic acid (FOLVITE) 412 MCG tablet Take 400 mcg by mouth daily.      hydrochlorothiazide (HYDRODIURIL) 12.5 MG tablet Take 12.5 mg by mouth daily.     hydrocortisone (ANUSOL-HC) 2.5 % rectal cream Place 1 application rectally 2 (two) times daily. 30 g 0   melatonin 3 MG TABS tablet Take 6 mg by mouth at bedtime.     Omega-3 Fatty Acids (FISH  OIL ULTRA) 1400 MG CAPS Take 1,400 mg by mouth in the morning and at bedtime.     Probiotic Product (ALIGN) 4 MG CAPS Take 4 mg by mouth 3 (three) times a week.     sulfaSALAzine (AZULFIDINE) 500 MG EC tablet TAKE 2 TABLETS BY MOUTH TWICE DAILY 180 tablet 2   tiotropium (SPIRIVA) 18 MCG inhalation capsule Place 18 mcg into inhaler and inhale daily as needed (asthma).      VENTOLIN HFA 108 (90 BASE) MCG/ACT inhaler Inhale 1 puff into the lungs as needed for wheezing or shortness of breath.   2   Wheat Dextrin (BENEFIBER PO) Take 1 Dose by mouth daily.     No current facility-administered medications  for this visit.    Allergies as of 04/25/2021 - Review Complete 07/16/2020  Allergen Reaction Noted   Cephalexin  04/10/2021    Family History  Problem Relation Age of Onset   Lung cancer Mother    Colon cancer Paternal Grandmother    Breast cancer Maternal Aunt    Esophageal cancer Neg Hx    Rectal cancer Neg Hx    Stomach cancer Neg Hx    Pancreatic cancer Neg Hx     Social History   Socioeconomic History   Marital status: Married    Spouse name: Not on file   Number of children: 2   Years of education: Not on file   Highest education level: Not on file  Occupational History   Occupation: Retired   Tobacco Use   Smoking status: Former    Years: 30.00    Types: Cigarettes    Quit date: 10/05/2007    Years since quitting: 13.5   Smokeless tobacco: Never  Vaping Use   Vaping Use: Never used  Substance and Sexual Activity   Alcohol use: Yes    Alcohol/week: 2.0 standard drinks    Types: 2 Glasses of wine per week    Comment: once a week   Drug use: No   Sexual activity: Not Currently    Partners: Male    Birth control/protection: Post-menopausal  Other Topics Concern   Not on file  Social History Narrative   Daily caffeine    Social Determinants of Health   Financial Resource Strain: Not on file  Food Insecurity: Not on file  Transportation Needs: Not on file   Physical Activity: Not on file  Stress: Not on file  Social Connections: Not on file  Intimate Partner Violence: Not on file    Review of Systems:    Constitutional: No weight loss, fever or chills Cardiovascular: No chest pain Respiratory:+chronic SOB Gastrointestinal: See HPI and otherwise negative   Physical Exam:  Vital signs: BP 122/70    Pulse 94    Ht 5' 2.5" (1.588 m)    Wt 142 lb (64.4 kg)    BMI 25.56 kg/m    Constitutional:   Pleasant Elderly Caucasian female appears to be in NAD, Well developed, Well nourished, alert and cooperative Respiratory: Respirations even and unlabored. Lungs clear to auscultation bilaterally.   No wheezes, crackles, or rhonchi.  Cardiovascular: Normal S1, S2. No MRG. Regular rate and rhythm. No peripheral edema, cyanosis or pallor.  Gastrointestinal:  Soft, nondistended, nontender. No rebound or guarding. Normal bowel sounds. No appreciable masses or hepatomegaly. Rectal:  Declined Psychiatric:  Demonstrates good judgement and reason without abnormal affect or behaviors.  Requesting recent labs.  Assessment: 1.  Anemia: Per patient was anemic back in July, apparently had repeat labs in December with no further anemia after using iron supplementation for 6 months, recent colonoscopy in February, if further evaluation for this is needed would recommend an EGD 2.  Epigastric pain and reflux: Increase in symptoms over the past few months; consider gastritis 3.  Rectal bleeding: Same problem in December of last year, colonoscopy in February with internal and external hemorrhoids which were thought the source, bleeding resolved after using Hydrocortisone suppositories but is come back over the past few months, declined rectal exam today; most likely hemorrhoids  Plan: 1.  Discussed with patient that we will try to get her most recent labs.  We will also recheck a CBC and iron studies today.  If she continues to  be anemic at this point then may need to  consider an EGD.  If not and things remain stable then she would likely not need further evaluation. (Will need to figure out why she had colonoscopy at hospital last time, not sure where EGD would need to be if she needs it) 2.  Recommend the patient take her Pantoprazole 40 mg daily, 30-60 minutes before breakfast.  She tells me she has enough of this medication at home and can get a refill from her PCP. 3.  Patient also tells me she has Hydrocortisone ointment at home.  Told her to start using this, she can spread it over a Preparation H suppository and inserted twice daily as well as apply some to the outside of her rectum for hemorrhoids.  She should use this for no longer than 2 weeks at a time but explained that if she has bleeding in the future she can repeat use as needed. 4.  Patient to follow in clinic per recommendations after labs above.  Ellouise Newer, PA-C [facility] 04/25/2021, 3:11 PM  Cc: Michael Boston, MD

## 2021-04-25 NOTE — Patient Instructions (Addendum)
LABS:  Lab work has been ordered for you today. Our lab is located in the basement. Press "B" on the elevator. The lab is located at the first door on the left as you exit the elevator.  HEALTHCARE LAWS AND MY CHART RESULTS: Due to recent changes in healthcare laws, you may see the results of your imaging and laboratory studies on MyChart before your provider has had a chance to review them.   We understand that in some cases there may be results that are confusing or concerning to you. Not all laboratory results come back in the same time frame and the provider may be waiting for multiple results in order to interpret others.  Please give [place] 48 hours in order for your provider to thoroughly review all the results before contacting the office for clarification of your results.   RECOMMENDATIONS: Start the Pantoprazole you have at home. Take it once a day. Use Hydrocortisone ointment on Preparation H suppositories and use twice a day for 2 weeks.  It was great seeing you today! Thank you for entrusting me with your care and choosing [facility].  Ellouise Newer, PA  The [facility] providers would like to encourage you to use [facility] to communicate with providers for non-urgent requests or questions.  Due to long hold times on the telephone, sending your provider a message by [facility] may be faster and more efficient way to get a response. Please allow 48 business hours for a response.  Please remember that this is for non-urgent requests/questions.  If you are age 80 or older, your body mass index should be between 23-30. Your Body mass index is 25.56 kg/m. If this is out of the aforementioned range listed, please consider follow up with your Primary Care Provider.  If you are age 98 or younger, your body mass index should be between 19-25. Your Body mass index is 25.56 kg/m. If this is out of the aformentioned range listed, please consider follow up with your Primary Care Provider.

## 2021-05-23 NOTE — Progress Notes (Signed)
Reviewed and agree with documentation and assessment and plan. K. Veena Tila Millirons , MD   

## 2021-05-24 ENCOUNTER — Encounter (HOSPITAL_COMMUNITY): Payer: Self-pay | Admitting: Emergency Medicine

## 2021-05-24 ENCOUNTER — Other Ambulatory Visit: Payer: Self-pay

## 2021-05-24 ENCOUNTER — Ambulatory Visit (HOSPITAL_COMMUNITY)
Admission: EM | Admit: 2021-05-24 | Discharge: 2021-05-24 | Disposition: A | Discharge status: Home / Self Care | Payer: PPO

## 2021-05-24 DIAGNOSIS — N3000 Acute cystitis without hematuria: Secondary | ICD-10-CM | POA: Diagnosis not present

## 2021-05-24 LAB — POCT URINALYSIS DIPSTICK, ED / UC
Glucose, UA: 100 mg/dL — AB
Nitrite: POSITIVE — AB
Protein, ur: 100 mg/dL — AB
Specific Gravity, Urine: 1.02 (ref 1.005–1.030)
Urobilinogen, UA: 2 mg/dL — ABNORMAL HIGH (ref 0.0–1.0)
pH: 6 (ref 5.0–8.0)

## 2021-05-24 MED ORDER — SULFAMETHOXAZOLE-TRIMETHOPRIM 800-160 MG PO TABS: 1.0000 | ORAL_TABLET | Freq: Two times a day (BID) | ORAL | 0 refills | Status: DC

## 2021-05-24 NOTE — ED Provider Notes (Signed)
[place]    CSN: [id number] Arrival date & time: 05/24/21  9563      History   Chief Complaint Chief Complaint  Patient presents with   Urinary Tract Infection    HPI Ashley Rivera is a 81 y.o. female. Patient has a history of uti.  Patient reports having symptoms on Tuesday -05/22/2021.  Reports urgency, frequency, "pinchy" feeling with urination and aching feeling in lower abdomen. Reports gets UTI maybe once a year. Review of records shows pt may get UTI 1-2x/year.    Urinary Tract Infection Associated symptoms: flank pain   Associated symptoms: no abdominal pain and no fever    Past Medical History:  Diagnosis Date   Allergy    Asthma    bronchial asthma   Cataract    left eye   Complication of anesthesia    Became congested after colonoscopy   COPD (chronic obstructive pulmonary disease) (HCC)    Diverticulosis    Emphysema of lung (HCC)    Genital warts    Dx about 30 years ago    GERD (gastroesophageal reflux disease)    Hemorrhoids    History of hiatal hernia    History of kidney stones    Hx of colonic polyp    Hx: UTI (urinary tract infection)    Osteoporosis    Pneumonia    Proctitis 11-12-11   Skin cancer    basal cell   Urinary incontinence    On occasion    Patient Active Problem List   Diagnosis Date Noted   Unspecified open wound, right lower leg, initial encounter 04/23/2021   Hardening of the aorta (main artery of the heart) ([place]) 04/23/2021   Essential hypertension 04/23/2021   Anemia 04/23/2021   Meningioma ([place]) 07/10/2020   Polyp of rectum    Chronic obstructive pulmonary disease ([place]) 01/28/2020   Coronary artery calcification 01/28/2020   DOE (dyspnea on exertion) 01/28/2020   Polyp of cecum    Non-thrombocytopenic purpura ([place]) 11/11/2015   Microscopic hematuria 10/26/2015   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   Diaphragmatic hernia 03/03/2015   GERD (gastroesophageal reflux disease) 01/17/2015   Vitamin D deficiency  10/31/2014   Peripheral venous insufficiency 09/16/2012   Pruritus ani 12/04/2011   Proctitis 12/04/2011   Personal history of colonic polyps 10/22/2011   Change in bowel habits 10/22/2011   Internal hemorrhoids without mention of complication 87/56/4332   Nicotine dependence 02/04/2009   Hyperlipidemia 02/04/2009   Age-related osteoporosis without current pathological fracture 02/04/2009    Past Surgical History:  Procedure Laterality Date   BIOPSY  07/03/2020   Procedure: BIOPSY;  Surgeon: Mauri Pole, MD;  [facility];  Service: Endoscopy;;   COLONOSCOPY     COLONOSCOPY WITH PROPOFOL N/A 10/28/2019   Procedure: COLONOSCOPY WITH PROPOFOL  LARGE CECAL POLYP;  Surgeon: Mauri Pole, MD;  [facility];  Service: Endoscopy;  Laterality: N/A;   COLONOSCOPY WITH PROPOFOL N/A 07/03/2020   Procedure: COLONOSCOPY WITH PROPOFOL;  Surgeon: Mauri Pole, MD;  [facility];  Service: Endoscopy;  Laterality: N/A;   CYSTOSCOPY/URETEROSCOPY/HOLMIUM LASER/STENT PLACEMENT Right 11/09/2019   Procedure: CYSTOSCOPY/URETEROSCOPY/HOLMIUM LASER/STENT PLACEMENT;  Surgeon: Festus Aloe, MD;  [facility];  Service: Urology;  Laterality: Right;   DILATION AND CURETTAGE OF UTERUS     50 years ago. Removed rest of placenta from birth   [place] N/A 10/28/2019   Procedure: ENDOSCOPIC MUCOSAL RESECTION;  Surgeon: Mauri Pole, MD;  [facility];  Service: Endoscopy;  Laterality: N/A;   EYE SURGERY     cataract   HEMOSTASIS CLIP PLACEMENT  10/28/2019   Procedure: HEMOSTASIS CLIP PLACEMENT;  Surgeon: Mauri Pole, MD;  [facility];  Service: Endoscopy;;   INNER EAR SURGERY Right 2006   Tumor removed    POLYPECTOMY  10/28/2019   Procedure: POLYPECTOMY;  Surgeon: Mauri Pole, MD;  [facility];  Service: Endoscopy;;   POLYPECTOMY  07/03/2020   Procedure: POLYPECTOMY;  Surgeon: Mauri Pole, MD;  [facility];  Service: Endoscopy;;   SUBMUCOSAL LIFTING INJECTION  10/28/2019   Procedure: SUBMUCOSAL LIFTING INJECTION;  Surgeon: Mauri Pole, MD;  [facility];  Service: Endoscopy;;   SUBMUCOSAL TATTOO INJECTION  10/28/2019   Procedure: SUBMUCOSAL TATTOO INJECTION;  Surgeon: Mauri Pole, MD;  [facility];  Service: Endoscopy;;   TONSILLECTOMY AND ADENOIDECTOMY  1950    OB History     Gravida  2   Para  2   Term  2   Preterm      AB      Living  2      SAB      IAB      Ectopic      Multiple      Live Births  2            Home Medications    Prior to Admission medications   Medication Sig Start Date End Date Taking? Authorizing Provider  sulfamethoxazole-trimethoprim (BACTRIM DS) 800-160 MG tablet Take 1 tablet by mouth 2 (two) times daily. 05/24/21  Yes Carvel Getting, NP  B Complex Vitamins (VITAMIN-B COMPLEX PO) Take 1 tablet by mouth every evening.    [provider]  budesonide-formoterol (SYMBICORT) 160-4.5 MCG/ACT inhaler Inhale 2 puffs into the lungs daily as needed (shortness of breath).     [provider]  calcium carbonate (TUMS - DOSED IN MG ELEMENTAL CALCIUM) 500 MG chewable tablet Chew 1 tablet by mouth as needed for indigestion or heartburn.    [provider]  carboxymethylcellulose (REFRESH PLUS) 0.5 % SOLN Place 2 drops into both eyes 2 (two) times daily as needed (dry/irritated eyes).    [provider]  cetirizine (ZYRTEC) 10 MG tablet Take 10 mg by mouth daily as needed for allergies.     [provider]  chlorthalidone (HYGROTON) 25 MG tablet Take 12.5 mg by mouth every morning. 03/01/21   [provider]  Cholecalciferol (VITAMIN D3) 50 MCG (2000 UT) TABS Take 2,000 Units by mouth daily.    [provider]  ezetimibe (ZETIA) 10 MG tablet Take 10 mg by mouth daily. 03/01/21   [provider]  melatonin 3 MG  TABS tablet Take 6 mg by mouth at bedtime.    [provider]  Omega-3 Fatty Acids (FISH OIL ULTRA) 1400 MG CAPS Take 1,400 mg by mouth in the morning and at bedtime.    [provider]  Probiotic Product (ALIGN) 4 MG CAPS Take 4 mg by mouth 3 (three) times a week.    [provider]  sulfaSALAzine (AZULFIDINE) 500 MG EC tablet TAKE 2 TABLETS BY MOUTH TWICE DAILY 10/23/20   Mauri Pole, MD  tiotropium (SPIRIVA) 18 MCG inhalation capsule Place 18 mcg into inhaler and inhale daily as needed (asthma).     [provider]  VENTOLIN HFA 108 (90 BASE) MCG/ACT inhaler Inhale 1 puff into the lungs  as needed for wheezing or shortness of breath.  11/04/14   [provider]  Wheat Dextrin (BENEFIBER PO) Take 1 Dose by mouth daily.    [provider]    Family History Family History  Problem Relation Age of Onset   Lung cancer Mother    Colon cancer Paternal Grandmother    Breast cancer Maternal Aunt    Esophageal cancer Neg Hx    Rectal cancer Neg Hx    Stomach cancer Neg Hx    Pancreatic cancer Neg Hx     Social History Social History   Tobacco Use   Smoking status: Former    Years: 30.00    Types: Cigarettes    Quit date: 10/05/2007    Years since quitting: 13.6   Smokeless tobacco: Never  Vaping Use   Vaping Use: Never used  Substance Use Topics   Alcohol use: Yes    Alcohol/week: 2.0 standard drinks    Types: 2 Glasses of wine per week    Comment: once a week   Drug use: No     Allergies   Cephalexin   Review of Systems Review of Systems  Constitutional:  Negative for chills and fever.  Gastrointestinal:  Negative for abdominal pain.  Genitourinary:  Positive for dysuria, flank pain and frequency. Negative for hematuria.    Physical Exam Triage Vital Signs ED Triage Vitals  Enc Vitals Group     BP 05/24/21 1015 120/62     Pulse Rate 05/24/21 1015 83     Resp 05/24/21 1015 20     Temp 05/24/21 1015 98.5 F  (36.9 C)     Temp Source 05/24/21 1015 Oral     SpO2 05/24/21 1015 96 %     Weight --      Height --      Head Circumference --      Peak Flow --      Pain Score 05/24/21 1010 3     Pain Loc --      Pain Edu? --      Excl. in [place]? --    No data found.  Updated Vital Signs BP 120/62 (BP Location: Left Arm)    Pulse 83    Temp 98.5 F (36.9 C) (Oral)    Resp 20    SpO2 96%   Visual Acuity Right Eye Distance:   Left Eye Distance:   Bilateral Distance:    Right Eye Near:   Left Eye Near:    Bilateral Near:     Physical Exam Constitutional:      General: She is not in acute distress.    Appearance: Normal appearance. She is not ill-appearing.  Pulmonary:     Effort: Pulmonary effort is normal.  Abdominal:     Tenderness: There is no right CVA tenderness or left CVA tenderness.  Neurological:     Mental Status: She is alert.     UC Treatments / Results  Labs (all labs ordered are listed, but only abnormal results are displayed) Labs Reviewed  POCT URINALYSIS DIPSTICK, ED / UC - Abnormal; Notable for the following components:      Result Value   Glucose, UA 100 (*)    Bilirubin Urine MODERATE (*)    Ketones, ur TRACE (*)    Hgb urine dipstick TRACE (*)    Protein, ur 100 (*)    Urobilinogen, UA 2.0 (*)    Nitrite POSITIVE (*)    Leukocytes,Ua SMALL (*)  All other components within normal limits    EKG   Radiology No results found.  Procedures Procedures (including critical care time)  Medications Ordered in UC Medications - No data to display  Initial Impression / Assessment and Plan / UC Course  I have reviewed the triage vital signs and the nursing notes.  Pertinent labs & imaging results that were available during my care of the patient were reviewed by me and considered in my medical decision making (see chart for details).    F/u with urology  Final Clinical Impressions(s) / UC Diagnoses   Final diagnoses:  Acute cystitis without  hematuria     Discharge Instructions      Consider talking with your urologist about vaginal estrogen to prevent urinary tract infections   ED Prescriptions     Medication Sig Dispense Auth. Provider   sulfamethoxazole-trimethoprim (BACTRIM DS) 800-160 MG tablet Take 1 tablet by mouth 2 (two) times daily. 6 tablet Carvel Getting, NP      PDMP not reviewed this encounter.   Carvel Getting, NP 05/24/21 1040

## 2021-05-24 NOTE — Discharge Instructions (Signed)
Consider talking with your urologist about vaginal estrogen to prevent urinary tract infections

## 2021-05-24 NOTE — ED Triage Notes (Signed)
Patient has a history of uti.  Patient reports having symptoms on Tuesday -05/22/2021.  Reports urgency, frequency, "pinchy" feeling with urination and aching feeling in lower abdomen

## 2021-05-27 ENCOUNTER — Other Ambulatory Visit: Payer: Self-pay | Admitting: Gastroenterology

## 2021-06-20 ENCOUNTER — Ambulatory Visit: Payer: PPO | Admitting: Internal Medicine

## 2021-06-20 ENCOUNTER — Other Ambulatory Visit: Payer: Self-pay

## 2021-06-20 ENCOUNTER — Ambulatory Visit (INDEPENDENT_AMBULATORY_CARE_PROVIDER_SITE_OTHER): Payer: PPO

## 2021-06-20 ENCOUNTER — Encounter: Payer: Self-pay | Admitting: Internal Medicine

## 2021-06-20 VITALS — BP 130/68 | HR 81 | Ht 62.0 in | Wt 143.0 lb

## 2021-06-20 DIAGNOSIS — I2584 Coronary atherosclerosis due to calcified coronary lesion: Secondary | ICD-10-CM | POA: Diagnosis not present

## 2021-06-20 DIAGNOSIS — I2510 Atherosclerotic heart disease of native coronary artery without angina pectoris: Secondary | ICD-10-CM

## 2021-06-20 DIAGNOSIS — I493 Ventricular premature depolarization: Secondary | ICD-10-CM | POA: Diagnosis not present

## 2021-06-20 DIAGNOSIS — R0602 Shortness of breath: Secondary | ICD-10-CM

## 2021-06-20 DIAGNOSIS — I10 Essential (primary) hypertension: Secondary | ICD-10-CM

## 2021-06-20 NOTE — Patient Instructions (Signed)
Medication Instructions:  Your physician recommends that you continue on your current medications as directed. Please refer to the Current Medication list given to you today.  *If you need a refill on your cardiac medications before your next appointment, please call your pharmacy*   Lab Work: NONE If you have labs (blood work) drawn today and your tests are completely normal, you will receive your results only by: [place] (if you have MyChart) OR A paper copy in the mail If you have any lab test that is abnormal or we need to change your treatment, we will call you to review the results.   Testing/Procedures: Your physician has requested that you have an echocardiogram. Echocardiography is a painless test that uses sound waves to create images of your heart. It provides your doctor with information about the size and shape of your heart and how well your hearts chambers and valves are working. This procedure takes approximately one hour. There are no restrictions for this procedure.  Your physician has requested that you wear a 14 day heart monitor.    Follow-Up: At [facility], you and your health needs are our priority.  As part of our continuing mission to provide you with exceptional heart care, we have created designated Provider Care Teams.  These Care Teams include your primary Cardiologist (physician) and Advanced Practice Providers (APPs -  Physician Assistants and Nurse Practitioners) who all work together to provide you with the care you need, when you need it.  We recommend signing up for the patient portal called "MyChart".  Sign up information is provided on this After Visit Summary.  MyChart is used to connect with patients for Virtual Visits (Telemedicine).  Patients are able to view lab/test results, encounter notes, upcoming appointments, etc.  Non-urgent messages can be sent to your provider as well.   To learn more about what you can do with MyChart, go to  [email or web address].    Your next appointment:   3 -4 month(s)  The format for your next appointment:   In Person  Provider:   Werner Lean, MD     Other Instructions ZIO XT- Long Term Monitor Instructions  Your physician has requested you wear a ZIO patch monitor for 14 days.  This is a single patch monitor. [employer] supplies one patch monitor per enrollment. Additional stickers are not available. Please do not apply patch if you will be having a Nuclear Stress Test,  Echocardiogram, Cardiac CT, MRI, or Chest Xray during the period you would be wearing the  monitor. The patch cannot be worn during these tests. You cannot remove and re-apply the  ZIO XT patch monitor.  Your ZIO patch monitor will be mailed 3 day USPS to your address on file. It may take 3-5 days  to receive your monitor after you have been enrolled.  Once you have received your monitor, please review the enclosed instructions. Your monitor  has already been registered assigning a specific monitor serial # to you.  Billing and Patient Assistance Program Information  We have supplied [employer] with any of your insurance information on file for billing purposes. [employer] offers a sliding scale Patient Assistance Program for patients that do not have  insurance, or whose insurance does not completely cover the cost of the ZIO monitor.  You must apply for the Patient Assistance Program to qualify for this discounted rate.  To apply, please call [employer] at [phone], select option 4, select option 2, ask to apply for  Patient Assistance Program. Theodore Demark will ask your household income, and how many people  are in your household. They will quote your out-of-pocket cost based on that information.  [employer] will also be able to set up a 76-month interest-free payment plan if needed.  Applying the monitor   Shave hair from upper left chest.  Hold abrader disc by orange tab. Rub abrader in 40 strokes over the  upper left chest as  indicated in your monitor instructions.  Clean area with 4 enclosed alcohol pads. Let dry.  Apply patch as indicated in monitor instructions. Patch will be placed under collarbone on left  side of chest with arrow pointing upward.  Rub patch adhesive wings for 2 minutes. Remove white label marked "1". Remove the white  label marked "2". Rub patch adhesive wings for 2 additional minutes.  While looking in a mirror, press and release button in center of patch. A small green light will  flash 3-4 times. This will be your only indicator that the monitor has been turned on.  Do not shower for the first 24 hours. You may shower after the first 24 hours.  Press the button if you feel a symptom. You will hear a small click. Record Date, Time and  Symptom in the Patient Logbook.  When you are ready to remove the patch, follow instructions on the last 2 pages of Patient  Logbook. Stick patch monitor onto the last page of Patient Logbook.  Place Patient Logbook in the blue and white box. Use locking tab on box and tape box closed  securely. The blue and white box has prepaid postage on it. Please place it in the mailbox as  soon as possible. Your physician should have your test results approximately 7 days after the  monitor [facility]  Call [employer] [phone] you have questions regarding  your ZIO XT patch monitor. Call them immediately if you see an orange light blinking on your  monitor.  If your monitor falls off in less than 4 days, contact our Monitor department at [phone]  If your monitor becomes loose or falls off after 4 days call [employer] at [phone]  suggestions on securing your monitor

## 2021-06-20 NOTE — Progress Notes (Unsigned)
z

## 2021-06-20 NOTE — Progress Notes (Signed)
Cardiology Office Note:    Date:  06/20/2021   ID:  Ashley Rivera, DOB 05-01-1941, MRN [id number]  PCP:  Michael Boston, MD  [facility] Cardiologist:  Werner Lean, MD  [place] Electrophysiologist:  None   Referring MD: Michael Boston, MD   CC: Follow up stress testing  History of Present Illness:    Ashley Rivera is a 81 y.o. female with a hx of HTN, HLD, Rare PVCs, COPD, who established in 2021 for stress testing.  Had grossly normal stress test 2021.  Seen in follow up 2023.  Patient notes that she is doing OK.   Notes that she has shortness of breath, that has been getting worse. She has COPD and asthma. Notes that she also feels like she can feel her heart at night. Almost always occurs at night: she feels like her heart is beating. Had planned for earlier follow up but her husband had a heart attack. No longer smoking.  Is fairly sedentary but does ADLs with vacuuming.    Past Medical History:  Diagnosis Date   Allergy    Asthma    bronchial asthma   Cataract    left eye   Complication of anesthesia    Became congested after colonoscopy   COPD (chronic obstructive pulmonary disease) (HCC)    Diverticulosis    Emphysema of lung (HCC)    Genital warts    Dx about 30 years ago    GERD (gastroesophageal reflux disease)    Hemorrhoids    History of hiatal hernia    History of kidney stones    Hx of colonic polyp    Hx: UTI (urinary tract infection)    Osteoporosis    Pneumonia    Proctitis 11-12-11   Skin cancer    basal cell   Urinary incontinence    On occasion    Past Surgical History:  Procedure Laterality Date   BIOPSY  07/03/2020   Procedure: BIOPSY;  Surgeon: Mauri Pole, MD;  Location: [facility];  Service: Endoscopy;;   COLONOSCOPY     COLONOSCOPY WITH PROPOFOL N/A 10/28/2019   Procedure: COLONOSCOPY WITH PROPOFOL  LARGE CECAL POLYP;  Surgeon: Mauri Pole, MD;  Location: [facility];  Service: Endoscopy;   Laterality: N/A;   COLONOSCOPY WITH PROPOFOL N/A 07/03/2020   Procedure: COLONOSCOPY WITH PROPOFOL;  Surgeon: Mauri Pole, MD;  Location: [facility];  Service: Endoscopy;  Laterality: N/A;   CYSTOSCOPY/URETEROSCOPY/HOLMIUM LASER/STENT PLACEMENT Right 11/09/2019   Procedure: CYSTOSCOPY/URETEROSCOPY/HOLMIUM LASER/STENT PLACEMENT;  Surgeon: Festus Aloe, MD;  Location: [facility];  Service: Urology;  Laterality: Right;   DILATION AND CURETTAGE OF UTERUS     50 years ago. Removed rest of placenta from birth   [place] N/A 10/28/2019   Procedure: ENDOSCOPIC MUCOSAL RESECTION;  Surgeon: Mauri Pole, MD;  Location: [facility];  Service: Endoscopy;  Laterality: N/A;   EYE SURGERY     cataract   HEMOSTASIS CLIP PLACEMENT  10/28/2019   Procedure: HEMOSTASIS CLIP PLACEMENT;  Surgeon: Mauri Pole, MD;  Location: [facility];  Service: Endoscopy;;   INNER EAR SURGERY Right 2006   Tumor removed    POLYPECTOMY  10/28/2019   Procedure: POLYPECTOMY;  Surgeon: Mauri Pole, MD;  Location: [facility];  Service: Endoscopy;;   POLYPECTOMY  07/03/2020   Procedure: POLYPECTOMY;  Surgeon: Mauri Pole, MD;  Location: [facility];  Service: Endoscopy;;   SUBMUCOSAL LIFTING INJECTION  10/28/2019  Procedure: SUBMUCOSAL LIFTING INJECTION;  Surgeon: Mauri Pole, MD;  Location: [facility];  Service: Endoscopy;;   SUBMUCOSAL TATTOO INJECTION  10/28/2019   Procedure: SUBMUCOSAL TATTOO INJECTION;  Surgeon: Mauri Pole, MD;  Location: [facility];  Service: Endoscopy;;   TONSILLECTOMY AND ADENOIDECTOMY  1950   Current Medications: Current Meds  Medication Sig   B Complex Vitamins (VITAMIN-B COMPLEX PO) Take 1 tablet by mouth every evening.   budesonide-formoterol (SYMBICORT) 160-4.5 MCG/ACT inhaler Inhale 2 puffs into the lungs daily as needed (shortness of breath).    calcium carbonate (TUMS - DOSED IN MG ELEMENTAL CALCIUM) 500 MG chewable  tablet Chew 1 tablet by mouth as needed for indigestion or heartburn.   carboxymethylcellulose (REFRESH PLUS) 0.5 % SOLN Place 2 drops into both eyes 2 (two) times daily as needed (dry/irritated eyes).   cetirizine (ZYRTEC) 10 MG tablet Take 10 mg by mouth daily as needed for allergies.    chlorthalidone (HYGROTON) 25 MG tablet Take 12.5 mg by mouth every morning.   Cholecalciferol (VITAMIN D3) 50 MCG (2000 UT) TABS Take 2,000 Units by mouth daily.   ezetimibe (ZETIA) 10 MG tablet Take 10 mg by mouth daily.   Iron, Ferrous Sulfate, 142 (45 Fe) MG TBCR Take by mouth daily at 6 (six) AM.   melatonin 3 MG TABS tablet Take 6 mg by mouth at bedtime.   Omega-3 Fatty Acids (FISH OIL ULTRA) 1400 MG CAPS Take 1,400 mg by mouth in the morning and at bedtime.   pantoprazole (PROTONIX) 20 MG tablet Take 20 mg by mouth daily.   Probiotic Product (ALIGN) 4 MG CAPS Take 4 mg by mouth 3 (three) times a week.   sulfaSALAzine (AZULFIDINE) 500 MG EC tablet TAKE 2 TABLETS BY MOUTH TWICE DAILY   tiotropium (SPIRIVA) 18 MCG inhalation capsule Place 18 mcg into inhaler and inhale daily as needed (asthma).    VENTOLIN HFA 108 (90 BASE) MCG/ACT inhaler Inhale 1 puff into the lungs as needed for wheezing or shortness of breath.    Wheat Dextrin (BENEFIBER PO) Take 1 Dose by mouth daily.   [DISCONTINUED] sulfamethoxazole-trimethoprim (BACTRIM DS) 800-160 MG tablet Take 1 tablet by mouth 2 (two) times daily.    Allergies:   Cephalexin   Social History   Socioeconomic History   Marital status: Married    Spouse name: Not on file   Number of children: 2   Years of education: Not on file   Highest education level: Not on file  Occupational History   Occupation: Retired   Tobacco Use   Smoking status: Former    Years: 30.00    Types: Cigarettes    Quit date: 10/05/2007    Years since quitting: 13.7   Smokeless tobacco: Never  Vaping Use   Vaping Use: Never used  Substance and Sexual Activity   Alcohol use: Yes     Alcohol/week: 2.0 standard drinks    Types: 2 Glasses of wine per week    Comment: once a week   Drug use: No   Sexual activity: Not Currently    Partners: Male    Birth control/protection: Post-menopausal  Other Topics Concern   Not on file  Social History Narrative   Daily caffeine    Social Determinants of Health   Financial Resource Strain: Not on file  Food Insecurity: Not on file  Transportation Needs: Not on file  Physical Activity: Not on file  Stress: Not on file  Social Connections: Not on file  Social: missed last visit as her husband has MI  Family History: The patient's family history includes Breast cancer in her maternal aunt; Colon cancer in her paternal grandmother; Lung cancer in her mother. There is no history of Esophageal cancer, Rectal cancer, Stomach cancer, or Pancreatic cancer.  ROS:   Please see the history of present illness.    All other systems reviewed and are negative.  EKGs/Labs/Other Studies Reviewed:    The following studies were reviewed today:  EKG:   06/20/21: SR rate 81, rare polymoprhic PVCs  Recent Labs: 04/25/2021: Hemoglobin 13.1; Platelets 165.0   Physical Exam:    VS:  BP 130/68    Pulse 81    Ht 5' 2"  (1.575 m)    Wt 64.9 kg    SpO2 97%    BMI 26.16 kg/m     Wt Readings from Last 3 Encounters:  06/20/21 64.9 kg  04/25/21 64.4 kg  07/03/20 65.3 kg    Gen: no distress, elderly female   Neck: No JVD Cardiac: No Rubs or Gallops, no murmur, IRIR +2 radial pulses Respiratory: Clear to auscultation bilaterally, normal effort, normal  respiratory rate GI: Soft, nontender, non-distended  MS: bilateral non pitting edema;  moves all extremities Integument: Skin feels warm Neuro:  At time of evaluation, alert and oriented to person/place/time/situation  Psych: Normal affect, patient feels OK   ASSESSMENT:    1. SOB (shortness of breath)   2. Frequent PVCs   3. Essential hypertension   4. Coronary artery  calcification     PLAN:    HTN, HLD with LAD CAC Former Tobacco Abuse COPD - will get 14 day non live ZioPatch and Echo - if only rare PVCs, will attempt to escalate diuretic from CTZ to lasix - if still no improvement, at next visit will consider CCTA - this may be all related to her COPD - in subsequent visits will attempt to work on risk stratification (LDL < 55)    Medication Adjustments/Labs and Tests Ordered: Current medicines are reviewed at length with the patient today.  Concerns regarding medicines are outlined above.  Orders Placed This Encounter  Procedures   LONG TERM MONITOR (3-14 DAYS)   EKG 12-Lead   ECHOCARDIOGRAM COMPLETE   No orders of the defined types were placed in this encounter.   Patient Instructions  Medication Instructions:  Your physician recommends that you continue on your current medications as directed. Please refer to the Current Medication list given to you today.  *If you need a refill on your cardiac medications before your next appointment, please call your pharmacy*   Lab Work: NONE If you have labs (blood work) drawn today and your tests are completely normal, you will receive your results only by: [place] (if you have MyChart) OR A paper copy in the mail If you have any lab test that is abnormal or we need to change your treatment, we will call you to review the results.   Testing/Procedures: Your physician has requested that you have an echocardiogram. Echocardiography is a painless test that uses sound waves to create images of your heart. It provides your doctor with information about the size and shape of your heart and how well your hearts chambers and valves are working. This procedure takes approximately one hour. There are no restrictions for this procedure.  Your physician has requested that you wear a 14 day heart monitor.    Follow-Up: At [facility], you and your health needs are  our priority.  As part  of our continuing mission to provide you with exceptional heart care, we have created designated Provider Care Teams.  These Care Teams include your primary Cardiologist (physician) and Advanced Practice Providers (APPs -  Physician Assistants and Nurse Practitioners) who all work together to provide you with the care you need, when you need it.  We recommend signing up for the patient portal called "MyChart".  Sign up information is provided on this After Visit Summary.  MyChart is used to connect with patients for Virtual Visits (Telemedicine).  Patients are able to view lab/test results, encounter notes, upcoming appointments, etc.  Non-urgent messages can be sent to your provider as well.   To learn more about what you can do with MyChart, go to [email or web address].    Your next appointment:   3 -4 month(s)  The format for your next appointment:   In Person  Provider:   Werner Lean, MD     Other Instructions ZIO XT- Long Term Monitor Instructions  Your physician has requested you wear a ZIO patch monitor for 14 days.  This is a single patch monitor. [employer] supplies one patch monitor per enrollment. Additional stickers are not available. Please do not apply patch if you will be having a Nuclear Stress Test,  Echocardiogram, Cardiac CT, MRI, or Chest Xray during the period you would be wearing the  monitor. The patch cannot be worn during these tests. You cannot remove and re-apply the  ZIO XT patch monitor.  Your ZIO patch monitor will be mailed 3 day USPS to your address on file. It may take 3-5 days  to receive your monitor after you have been enrolled.  Once you have received your monitor, please review the enclosed instructions. Your monitor  has already been registered assigning a specific monitor serial # to you.  Billing and Patient Assistance Program Information  We have supplied [employer] with any of your insurance information on file for billing  purposes. [employer] offers a sliding scale Patient Assistance Program for patients that do not have  insurance, or whose insurance does not completely cover the cost of the ZIO monitor.  You must apply for the Patient Assistance Program to qualify for this discounted rate.  To apply, please call [employer] at [phone], select option 4, select option 2, ask to apply for  Patient Assistance Program. Theodore Demark will ask your household income, and how many people  are in your household. They will quote your out-of-pocket cost based on that information.  [employer] will also be able to set up a 12-month interest-free payment plan if needed.  Applying the monitor   Shave hair from upper left chest.  Hold abrader disc by orange tab. Rub abrader in 40 strokes over the upper left chest as  indicated in your monitor instructions.  Clean area with 4 enclosed alcohol pads. Let dry.  Apply patch as indicated in monitor instructions. Patch will be placed under collarbone on left  side of chest with arrow pointing upward.  Rub patch adhesive wings for 2 minutes. Remove white label marked "1". Remove the white  label marked "2". Rub patch adhesive wings for 2 additional minutes.  While looking in a mirror, press and release button in center of patch. A small green light will  flash 3-4 times. This will be your only indicator that the monitor has been turned on.  Do not shower for the first 24 hours. You may shower after the first 24 hours.  Press the button if you feel a symptom. You will hear a small click. Record Date, Time and  Symptom in the Patient Logbook.  When you are ready to remove the patch, follow instructions on the last 2 pages of Patient  Logbook. Stick patch monitor onto the last page of Patient Logbook.  Place Patient Logbook in the blue and white box. Use locking tab on box and tape box closed  securely. The blue and white box has prepaid postage on it. Please place it in the mailbox as  soon  as possible. Your physician should have your test results approximately 7 days after the  monitor has been mailed back to [facility].  Call [employer] at [phone] if you have questions regarding  your ZIO XT patch monitor. Call them immediately if you see an orange light blinking on your  monitor.  If your monitor falls off in less than 4 days, contact our Monitor department at [phone].  If your monitor becomes loose or falls off after 4 days call [employer] at [phone] for  suggestions on securing your monitor     Signed, Werner Lean, MD  06/20/2021 5:06 PM    [place]

## 2021-06-28 ENCOUNTER — Other Ambulatory Visit: Payer: Self-pay

## 2021-06-28 ENCOUNTER — Ambulatory Visit (HOSPITAL_COMMUNITY): Payer: PPO | Attending: Cardiology

## 2021-06-28 DIAGNOSIS — I493 Ventricular premature depolarization: Secondary | ICD-10-CM | POA: Diagnosis not present

## 2021-06-28 DIAGNOSIS — R0602 Shortness of breath: Secondary | ICD-10-CM | POA: Insufficient documentation

## 2021-06-28 LAB — ECHOCARDIOGRAM COMPLETE
Area-P 1/2: 4.29 cm2
S' Lateral: 2.8 cm

## 2021-06-29 DIAGNOSIS — I493 Ventricular premature depolarization: Secondary | ICD-10-CM | POA: Diagnosis not present

## 2021-06-29 DIAGNOSIS — R0602 Shortness of breath: Secondary | ICD-10-CM | POA: Diagnosis not present

## 2021-07-02 ENCOUNTER — Encounter: Payer: Self-pay | Admitting: Internal Medicine

## 2021-07-19 DIAGNOSIS — I493 Ventricular premature depolarization: Secondary | ICD-10-CM | POA: Diagnosis not present

## 2021-07-19 DIAGNOSIS — R0602 Shortness of breath: Secondary | ICD-10-CM | POA: Diagnosis not present

## 2021-07-24 ENCOUNTER — Encounter: Payer: Self-pay | Admitting: Internal Medicine

## 2021-07-27 ENCOUNTER — Ambulatory Visit: Payer: PPO | Admitting: Physician Assistant

## 2021-08-02 ENCOUNTER — Other Ambulatory Visit (INDEPENDENT_AMBULATORY_CARE_PROVIDER_SITE_OTHER): Payer: PPO

## 2021-08-02 ENCOUNTER — Encounter: Payer: Self-pay | Admitting: Physician Assistant

## 2021-08-02 ENCOUNTER — Ambulatory Visit: Payer: PPO | Admitting: Physician Assistant

## 2021-08-02 DIAGNOSIS — K625 Hemorrhage of anus and rectum: Secondary | ICD-10-CM | POA: Diagnosis not present

## 2021-08-02 DIAGNOSIS — D509 Iron deficiency anemia, unspecified: Secondary | ICD-10-CM

## 2021-08-02 LAB — CBC
HCT: 39.1 % (ref 36.0–46.0)
Hemoglobin: 12.7 g/dL (ref 12.0–15.0)
MCHC: 32.6 g/dL (ref 30.0–36.0)
MCV: 90.1 fl (ref 78.0–100.0)
Platelets: 153 10*3/uL (ref 150.0–400.0)
RBC: 4.34 Mil/uL (ref 3.87–5.11)
RDW: 14 % (ref 11.5–15.5)
WBC: 7.1 10*3/uL (ref 4.0–10.5)

## 2021-08-02 LAB — IBC + FERRITIN
Ferritin: 7.4 ng/mL — ABNORMAL LOW (ref 10.0–291.0)
Iron: 56 ug/dL (ref 42–145)
Saturation Ratios: 13 % — ABNORMAL LOW (ref 20.0–50.0)
TIBC: 431.2 ug/dL (ref 250.0–450.0)
Transferrin: 308 mg/dL (ref 212.0–360.0)

## 2021-08-02 NOTE — Patient Instructions (Addendum)
If you are age 81 or older, your body mass index should be between 23-30. Your Body mass index is 27.09 kg/m?Marland Kitchen If this is out of the aforementioned range listed, please consider follow up with your Primary Care Provider. ?________________________________________________________ ? ?The [place] [facility] providers would like to encourage you to use [facility] to communicate with providers for non-urgent requests or questions.  Due to long hold times on the telephone, sending your provider a message by [facility] may be a faster and more efficient way to get a response.  Please allow 48 business hours for a response.  Please remember that this is for non-urgent requests.  ?_______________________________________________________ ? ?Your provider has requested that you go to the basement level for lab work before leaving today. Press "B" on the elevator. The lab is located at the first door on the left as you exit the elevator. ? ?Due to recent changes in healthcare laws, you may see the results of your imaging and laboratory studies on MyChart before your provider has had a chance to review them.  We understand that in some cases there may be results that are confusing or concerning to you. Not all laboratory results come back in the same time frame and the provider may be waiting for multiple results in order to interpret others.  Please give [place] 48 hours in order for your provider to thoroughly review all the results before contacting the office for clarification of your results.  ? ?You have been scheduled for an appointment with ___________ at [facility]. Your appointment is on _______ at  _______. Please arrive at _______ for registration. Make certain to bring a list of current medications, including any over the counter medications or vitamins. Also bring your co-pay if you have one as well as your insurance cards. [facility] is located at [address], [address]. Should you need to  reschedule your appointment, please contact them at [phone]. ? ?You will follow up with our office on as needed basis. ? ?Thank you for entrusting me with your care and choosing [facility]. ? ?Ellouise Newer, PA-C ?

## 2021-08-02 NOTE — Progress Notes (Signed)
? ?Chief Complaint: Follow-up rectal bleeding and anemia, history of chronic ulcerative proctitis ? ?HPI: ?   Ashley Rivera is an 81 year old Caucasian female with a past medical history of chronic ulcerative proctitis on Sulfasalazine, COPD (06/28/2021 echo with LVEF 62%), emphysema and multiple others, known to Dr. Silverio Decamp, who returns to clinic today for follow-up of her anemia and rectal bleeding. ? 04/10/2020 patient seen in clinic for history of colon polyps and rectal bleeding.  At that time started on Anusol suppositories and recommended a colonoscopy. ?   07/03/2020 colonoscopy with 1 less than 1 mm polyp in the cecum, 3 4-6 mm polyps in the rectum, sigmoid colon and transverse colon as well as severe diverticulosis in the sigmoid, descending, transverse and ascending colon.  Evidence of diverticular spasm.  Prolapsed external and internal hemorrhoids.  Patient was given more Hydrocortisone suppositories and told she did not need repeat colonoscopy due to age.  Pathology showed sessile serrated adenoma and hyperplastic polyp without cytologic dysplasia in the sigmoid, transverse and rectum.  The cecum polypectomies polypoid colonic mucosa. ?   04/25/2021 patient seen in clinic and described that she had been told she was anemic back in July and it started oral therapy.  Also discussed some reflux symptoms.  Also describes some bleeding with bowel movements.  She had repeat labs in December with no further anemia.  Discussed her rectal bleeding and recommended repeat Hydrocortisone ointment on Preparation H suppositories twice a day x1 to 2 weeks.  Also recommend she take her Pantoprazole 40 mg for reflux.  At that time discussed possibility of EGD if she was still anemic. ?   04/25/2021 CBC was normal.  Her iron levels were still minimally decreased with a percent saturation of 15.5%.  It was recommended that she stay on her oral iron supplementation for another month. ?   07/22/2021 patient contacted [place] in  regards to still having some rectal bleeding.  Patient described still having off-and-on rectal bleeding regardless of using the suppositories at times. ?   Today, the patient tells me that regardless of what she does for her rectum she still has bleeding with every bowel movement, just a trace amount on the toilet paper and feels like something comes out that she has to push back in.  She has tried the Hydrocortisone ointment on a Preparation H suppository twice daily for 2 weeks at a time and it never seems to really make a difference.  In the interim she has been using Preparation H suppositories and Preparation H cream on the outside as well as taking much care with wet wipes and dabbing dry.  Tells me she just wants to make sure that this ongoing bleeding is normal. ?   Denies fever, chills, weight loss or symptoms that awaken her from sleep. ? ?Past Medical History:  ?Diagnosis Date  ? Allergy   ? Asthma   ? bronchial asthma  ? Cataract   ? left eye  ? Complication of anesthesia   ? Became congested after colonoscopy  ? COPD (chronic obstructive pulmonary disease) ([facility])   ? Diverticulosis   ? Emphysema of lung ([place])   ? Genital warts   ? Dx about 30 years ago   ? GERD (gastroesophageal reflux disease)   ? Hemorrhoids   ? History of hiatal hernia   ? History of kidney stones   ? Hx of colonic polyp   ? Hx: UTI (urinary tract infection)   ? Osteoporosis   ? Pneumonia   ?  Proctitis 11-12-11  ? Skin cancer   ? basal cell  ? Urinary incontinence   ? On occasion  ? ? ?Past Surgical History:  ?Procedure Laterality Date  ? BIOPSY  07/03/2020  ? Procedure: BIOPSY;  Surgeon: Mauri Pole, MD;  [facility];  Service: Endoscopy;;  ? COLONOSCOPY    ? COLONOSCOPY WITH PROPOFOL N/A 10/28/2019  ? Procedure: COLONOSCOPY WITH PROPOFOL  LARGE CECAL POLYP;  Surgeon: Mauri Pole, MD;  [facility];  Service: Endoscopy;  Laterality: N/A;  ? COLONOSCOPY WITH PROPOFOL N/A 07/03/2020  ? Procedure:  COLONOSCOPY WITH PROPOFOL;  Surgeon: Mauri Pole, MD;  [facility];  Service: Endoscopy;  Laterality: N/A;  ? CYSTOSCOPY/URETEROSCOPY/HOLMIUM LASER/STENT PLACEMENT Right 11/09/2019  ? Procedure: CYSTOSCOPY/URETEROSCOPY/HOLMIUM LASER/STENT PLACEMENT;  Surgeon: Festus Aloe, MD;  [facility];  Service: Urology;  Laterality: Right;  ? DILATION AND CURETTAGE OF UTERUS    ? 50 years ago. Removed rest of placenta from birth  ? ENDOSCOPIC MUCOSAL RESECTION N/A 10/28/2019  ? Procedure: ENDOSCOPIC MUCOSAL RESECTION;  Surgeon: Mauri Pole, MD;  [facility];  Service: Endoscopy;  Laterality: N/A;  ? EYE SURGERY    ? cataract  ? HEMOSTASIS CLIP PLACEMENT  10/28/2019  ? Procedure: HEMOSTASIS CLIP PLACEMENT;  Surgeon: Mauri Pole, MD;  [facility];  Service: Endoscopy;;  ? INNER EAR SURGERY Right 2006  ? Tumor removed   ? POLYPECTOMY  10/28/2019  ? Procedure: POLYPECTOMY;  Surgeon: Mauri Pole, MD;  [facility];  Service: Endoscopy;;  ? POLYPECTOMY  07/03/2020  ? Procedure: POLYPECTOMY;  Surgeon: Mauri Pole, MD;  [facility];  Service: Endoscopy;;  ? SUBMUCOSAL LIFTING INJECTION  10/28/2019  ? Procedure: SUBMUCOSAL LIFTING INJECTION;  Surgeon: Mauri Pole, MD;  [facility];  Service: Endoscopy;;  ? SUBMUCOSAL TATTOO INJECTION  10/28/2019  ? Procedure: SUBMUCOSAL TATTOO INJECTION;  Surgeon: Mauri Pole, MD;  [facility];  Service: Endoscopy;;  ? [place]  ? ? ?Current Outpatient Medications  ?Medication Sig Dispense Refill  ? B Complex Vitamins (VITAMIN-B COMPLEX PO) Take 1 tablet by mouth every evening.    ? budesonide-formoterol (SYMBICORT) 160-4.5 MCG/ACT inhaler Inhale 2 puffs into the lungs daily as needed (shortness of breath).     ? calcium carbonate (TUMS - DOSED IN MG ELEMENTAL CALCIUM) 500 MG chewable tablet Chew 1 tablet by mouth as needed for indigestion or  heartburn.    ? carboxymethylcellulose (REFRESH PLUS) 0.5 % SOLN Place 2 drops into both eyes 2 (two) times daily as needed (dry/irritated eyes).    ? cetirizine (ZYRTEC) 10 MG tablet Take 10 mg by mouth daily as needed for allergies.     ? chlorthalidone (HYGROTON) 25 MG tablet Take 12.5 mg by mouth every morning.    ? Cholecalciferol (VITAMIN D3) 50 MCG (2000 UT) TABS Take 2,000 Units by mouth daily.    ? ezetimibe (ZETIA) 10 MG tablet Take 10 mg by mouth daily.    ? hydrocortisone 2.5 % cream as needed.    ? melatonin 3 MG TABS tablet Take 6 mg by mouth at bedtime.    ? Omega-3 Fatty Acids (FISH OIL ULTRA) 1400 MG CAPS Take 1,400 mg by mouth in the morning and at bedtime.    ? pantoprazole (PROTONIX) 20 MG tablet Take 20 mg by mouth daily.    ? Probiotic Product (ALIGN) 4 MG CAPS Take 4 mg by mouth 3 (three) times  a week.    ? sulfaSALAzine (AZULFIDINE) 500 MG EC tablet TAKE 2 TABLETS BY MOUTH TWICE DAILY 180 tablet 2  ? tiotropium (SPIRIVA) 18 MCG inhalation capsule Place 18 mcg into inhaler and inhale daily as needed (asthma).     ? VENTOLIN HFA 108 (90 BASE) MCG/ACT inhaler Inhale 1 puff into the lungs as needed for wheezing or shortness of breath.   2  ? Wheat Dextrin (BENEFIBER PO) Take 1 Dose by mouth daily.    ? ?No current facility-administered medications for this visit.  ? ? ?Allergies as of 08/02/2021 - Review Complete 08/02/2021  ?Allergen Reaction Noted  ? Cephalexin  04/10/2021  ? ? ?Family History  ?Problem Relation Age of Onset  ? Lung cancer Mother   ? Colon cancer Paternal Grandmother   ? Breast cancer Maternal Aunt   ? Esophageal cancer Neg Hx   ? Rectal cancer Neg Hx   ? Stomach cancer Neg Hx   ? Pancreatic cancer Neg Hx   ? ? ?Social History  ? ?Socioeconomic History  ? Marital status: Married  ?  Spouse name: Not on file  ? Number of children: 2  ? Years of education: Not on file  ? Highest education level: Not on file  ?Occupational History  ? Occupation: Retired   ?Tobacco Use  ? Smoking  status: Former  ?  Years: 30.00  ?  Types: Cigarettes  ?  Quit date: 10/05/2007  ?  Years since quitting: 13.8  ? Smokeless tobacco: Never  ?Vaping Use  ? Vaping Use: Never used  ?Substance and Sexual Activity  ?

## 2021-08-02 NOTE — Progress Notes (Signed)
Reviewed and agree with documentation and assessment and plan. K. Veena Shalamar Crays , MD   

## 2021-08-16 ENCOUNTER — Telehealth: Payer: Self-pay | Admitting: *Deleted

## 2021-08-16 DIAGNOSIS — K6289 Other specified diseases of anus and rectum: Secondary | ICD-10-CM | POA: Diagnosis not present

## 2021-08-16 NOTE — Telephone Encounter (Signed)
Dr. Altamese Dilling, we received request for surgical clearance for your patient. Our protocol states that we will conduct a telephone assessment to ensure Ashley Rivera is not currently having concerning symptoms that would warrant further testing or postponement of her surgery. Do you have any reservations with clearing her for her upcoming procedure? If not, we will schedule a virtual visit with one of our APPs to get her cleared. ? ?Please route your response to p cv div preop. Thank you. ? ?Ashley Life, NP-C ? ?  ?08/16/2021, 5:33 PM ?[facility] ?[address], [address] ?Office [phone] Fax [phone] ? ?

## 2021-08-16 NOTE — Telephone Encounter (Signed)
? ?  Pre-operative Risk Assessment  ?  ?Patient Name: Ashley Rivera  ?DOB: 05/24/1940 ?MRN: [id number]  ? ?  ? ?Request for Surgical Clearance   ? ?Procedure:   HEMORRHOIDECTOMY ? ?Date of Surgery:  Clearance TBD                              ?   ?Surgeon:  DR Cristal Deer WHITE ?Surgeon's Group or Practice Name:  [facility] ?Phone number:  [phone] ?Fax number:  [phone] ?  ?Type of Clearance Requested:   ?- Medical  ?  ?Type of Anesthesia:  General  ?  ?Additional requests/questions:     ? ?Signed, ?[name]   ?08/16/2021, 3:20 PM  ? ?

## 2021-08-17 MED ORDER — DILTIAZEM HCL ER COATED BEADS 120 MG PO CP24: 120.0000 mg | ORAL_CAPSULE | Freq: Every day | ORAL | 3 refills | Status: DC

## 2021-08-20 ENCOUNTER — Telehealth: Payer: Self-pay

## 2021-08-20 NOTE — Telephone Encounter (Signed)
? ? ?  Name: Ashley Rivera  ?DOB: May 04, 1941  ?MRN: [id number] ? ?Primary Cardiologist: Werner Lean, MD ? ?Preoperative team, please contact this patient and set up a phone call appointment for further preoperative risk assessment. Please obtain consent and complete medication review. Thank you for your help. ? ?Charlie Pitter, PA-C ?08/20/2021, 8:50 AM ?[phone] ?[facility] ?[address] ?[place], [place] [address] ? ? ?

## 2021-08-20 NOTE — Telephone Encounter (Signed)
?Patient Consent for Virtual Visit  ? ? ? ?[name], you are scheduled for a virtual visit with your provider 08/29/2021.  Just as we do with appointments in the office, we must obtain your consent to participate.  Your consent will be active for this visit and any virtual visit you may have with one of our providers in the next 365 days.  If you have a MyChart account, I can also send a copy of this consent to you electronically.  All virtual visits are billed to your insurance company just like a traditional visit in the office.  As this is a virtual visit, video technology does not allow for your provider to perform a traditional examination.  This may limit your provider's ability to fully assess your condition.  If your provider identifies any concerns that need to be evaluated in person or the need to arrange testing such as labs, EKG, etc, we will make arrangements to do so.  Although advances in technology are sophisticated, we cannot ensure that it will always work on either your end or our end.  If the connection with a video visit is poor, we may have to switch to a telephone visit.  With either a video or telephone visit, we are not always able to ensure that we have a secure connection.   I need to obtain your verbal consent now.   Are you willing to proceed with your visit today?  ? ? ? ? ?Ashley Rivera has provided verbal consent on 08/20/2021 for a virtual visit (video or telephone). ? ? ?CONSENT FOR VIRTUAL VISIT FOR:  Ashley Rivera  ?By participating in this virtual visit I agree to the following: ? ?I hereby voluntarily request, consent and authorize [facility] and its employed or contracted physicians, physician assistants, nurse practitioners or other licensed health care professionals (the Practitioner), to provide me with telemedicine health care services (the ?Services") as deemed necessary by the treating Practitioner. I acknowledge and consent to receive the Services by the  Practitioner via telemedicine. I understand that the telemedicine visit will involve communicating with the Practitioner through live audiovisual communication technology and the disclosure of certain medical information by electronic transmission. I acknowledge that I have been given the opportunity to request an in-person assessment or other available alternative prior to the telemedicine visit and am voluntarily participating in the telemedicine visit. ? ?I understand that I have the right to withhold or withdraw my consent to the use of telemedicine in the course of my care at any time, without affecting my right to future care or treatment, and that the Practitioner or I may terminate the telemedicine visit at any time. I understand that I have the right to inspect all information obtained and/or recorded in the course of the telemedicine visit and may receive copies of available information for a reasonable fee.  I understand that some of the potential risks of receiving the Services via telemedicine include:  ?Delay or interruption in medical evaluation due to technological equipment failure or disruption; ?Information transmitted may not be sufficient (e.g. poor resolution of images) to allow for appropriate medical decision making by the Practitioner; and/or  ?In rare instances, security protocols could fail, causing a breach of personal health information. ? ?Furthermore, I acknowledge that it is my responsibility to provide information about my medical history, conditions and care that is complete and accurate to the best of my ability. I acknowledge that Practitioner's advice, recommendations, and/or decision may be  based on factors not within their control, such as incomplete or inaccurate data provided by me or distortions of diagnostic images or specimens that may result from electronic transmissions. I understand that the practice of medicine is not an exact science and that Practitioner makes no  warranties or guarantees regarding treatment outcomes. I acknowledge that a copy of this consent can be made available to me via my patient portal ([facility]), or I can request a printed copy by calling the office of [facility].   ? ?I understand that my insurance will be billed for this visit.  ? ?I have read or had this consent read to me. ?I understand the contents of this consent, which adequately explains the benefits and risks of the Services being provided via telemedicine.  ?I have been provided ample opportunity to ask questions regarding this consent and the Services and have had my questions answered to my satisfaction. ?I give my informed consent for the services to be provided through the use of telemedicine in my medical care ? ? ? ?

## 2021-08-20 NOTE — Telephone Encounter (Signed)
TELEHEALTH follow up appointment scheduled. Patient agreeable and voiced understanding.  ?

## 2021-08-28 DIAGNOSIS — L570 Actinic keratosis: Secondary | ICD-10-CM | POA: Diagnosis not present

## 2021-08-28 DIAGNOSIS — L718 Other rosacea: Secondary | ICD-10-CM | POA: Diagnosis not present

## 2021-08-28 DIAGNOSIS — C44622 Squamous cell carcinoma of skin of right upper limb, including shoulder: Secondary | ICD-10-CM | POA: Diagnosis not present

## 2021-08-28 DIAGNOSIS — L821 Other seborrheic keratosis: Secondary | ICD-10-CM | POA: Diagnosis not present

## 2021-08-28 DIAGNOSIS — D485 Neoplasm of uncertain behavior of skin: Secondary | ICD-10-CM | POA: Diagnosis not present

## 2021-08-28 DIAGNOSIS — Z85828 Personal history of other malignant neoplasm of skin: Secondary | ICD-10-CM | POA: Diagnosis not present

## 2021-08-28 DIAGNOSIS — D2271 Melanocytic nevi of right lower limb, including hip: Secondary | ICD-10-CM | POA: Diagnosis not present

## 2021-08-29 ENCOUNTER — Ambulatory Visit (INDEPENDENT_AMBULATORY_CARE_PROVIDER_SITE_OTHER): Payer: PPO | Admitting: Physician Assistant

## 2021-08-29 DIAGNOSIS — I2584 Coronary atherosclerosis due to calcified coronary lesion: Secondary | ICD-10-CM

## 2021-08-29 DIAGNOSIS — Z01810 Encounter for preprocedural cardiovascular examination: Secondary | ICD-10-CM

## 2021-08-29 DIAGNOSIS — I2510 Atherosclerotic heart disease of native coronary artery without angina pectoris: Secondary | ICD-10-CM

## 2021-08-29 DIAGNOSIS — J449 Chronic obstructive pulmonary disease, unspecified: Secondary | ICD-10-CM

## 2021-08-29 NOTE — Progress Notes (Signed)
? ?Virtual Visit via Telephone Note  ? ?This visit type was conducted due to national recommendations for restrictions regarding the COVID-19 Pandemic (e.g. social distancing) in an effort to limit this patient's exposure and mitigate transmission in our community.  Due to her co-morbid illnesses, this patient is at least at moderate risk for complications without adequate follow up.  This format is felt to be most appropriate for this patient at this time.  The patient did not have access to video technology/had technical difficulties with video requiring transitioning to audio format only (telephone).  All issues noted in this document were discussed and addressed.  No physical exam could be performed with this format.  Please refer to the patient's chart for her  consent to telehealth for [facility]. ? ?Evaluation Performed:  Preoperative cardiovascular risk assessment ?_____________  ? ?Date:  08/29/2021  ? ?Patient ID:  Ashley Rivera, Ashley Rivera 11/13/1940, MRN [id number] ?Patient Location:  ?Home ?Provider location:   ?Office ? ?Primary Care Provider:  Michael Boston, MD ?Primary Cardiologist:  Werner Lean, MD ? ?Chief Complaint  ?  ?81 y.o. y/o female with a h/o  HTN, HLD, Rare PVCs, COPD, who is pending Hemorrhoidectomy, and presents today for telephonic preoperative cardiovascular risk assessment. ? ?Past Medical History  ?  ?Past Medical History:  ?Diagnosis Date  ? Allergy   ? Asthma   ? bronchial asthma  ? Cataract   ? left eye  ? Complication of anesthesia   ? Became congested after colonoscopy  ? COPD (chronic obstructive pulmonary disease) ([place])   ? Diverticulosis   ? Emphysema of lung ([place])   ? Genital warts   ? Dx about 30 years ago   ? GERD (gastroesophageal reflux disease)   ? Hemorrhoids   ? History of hiatal hernia   ? History of kidney stones   ? Hx of colonic polyp   ? Hx: UTI (urinary tract infection)   ? Osteoporosis   ? Pneumonia   ? Proctitis 11-12-11  ? Skin cancer   ? basal cell  ?  Urinary incontinence   ? On occasion  ? ?Past Surgical History:  ?Procedure Laterality Date  ? BIOPSY  07/03/2020  ? Procedure: BIOPSY;  Surgeon: Mauri Pole, MD;  [facility];  Service: Endoscopy;;  ? COLONOSCOPY    ? COLONOSCOPY WITH PROPOFOL N/A 10/28/2019  ? Procedure: COLONOSCOPY WITH PROPOFOL  LARGE CECAL POLYP;  Surgeon: Mauri Pole, MD;  [facility];  Service: Endoscopy;  Laterality: N/A;  ? COLONOSCOPY WITH PROPOFOL N/A 07/03/2020  ? Procedure: COLONOSCOPY WITH PROPOFOL;  Surgeon: Mauri Pole, MD;  [facility];  Service: Endoscopy;  Laterality: N/A;  ? CYSTOSCOPY/URETEROSCOPY/HOLMIUM LASER/STENT PLACEMENT Right 11/09/2019  ? Procedure: CYSTOSCOPY/URETEROSCOPY/HOLMIUM LASER/STENT PLACEMENT;  Surgeon: Festus Aloe, MD;  [facility];  Service: Urology;  Laterality: Right;  ? DILATION AND CURETTAGE OF UTERUS    ? 50 years ago. Removed rest of placenta from birth  ? ENDOSCOPIC MUCOSAL RESECTION N/A 10/28/2019  ? Procedure: ENDOSCOPIC MUCOSAL RESECTION;  Surgeon: Mauri Pole, MD;  [facility];  Service: Endoscopy;  Laterality: N/A;  ? EYE SURGERY    ? cataract  ? HEMOSTASIS CLIP PLACEMENT  10/28/2019  ? Procedure: HEMOSTASIS CLIP PLACEMENT;  Surgeon: Mauri Pole, MD;  [facility];  Service: Endoscopy;;  ? INNER EAR SURGERY Right 2006  ? Tumor removed   ? POLYPECTOMY  10/28/2019  ? Procedure: POLYPECTOMY;  Surgeon: Harl Bowie  V, MD;  [facility];  Service: Endoscopy;;  ? POLYPECTOMY  07/03/2020  ? Procedure: POLYPECTOMY;  Surgeon: Mauri Pole, MD;  [facility];  Service: Endoscopy;;  ? SUBMUCOSAL LIFTING INJECTION  10/28/2019  ? Procedure: SUBMUCOSAL LIFTING INJECTION;  Surgeon: Mauri Pole, MD;  [facility];  Service: Endoscopy;;  ? SUBMUCOSAL TATTOO INJECTION  10/28/2019  ? Procedure: SUBMUCOSAL TATTOO INJECTION;  Surgeon: Mauri Pole, MD;  [facility];  Service: Endoscopy;;  ? [place]  ? ? ?Allergies ? ?Allergies  ?Allergen Reactions  ? Cephalexin   ?  Other reaction(s): rash on chest/hives  ? ? ?History of Present Illness  ?  ?Ashley Rivera is a 81 y.o. female who presents via audio/video conferencing for a telehealth visit today.  Pt was last seen in cardiology clinic on 06/20/21 by Dr. Gasper Sells.  At that time AMARACHUKWU LAKATOS was doing well .  The patient is now pending Hemorrhoidectomy.  Since her last visit, she well. ? ?Has chronic dyspnea due to COPD. The patient denies nausea, vomiting, fever, chest pain, palpitations,  orthopnea, PND, dizziness, syncope, cough, congestion, abdominal pain, hematochezia, melena, lower extremity edema. Able to get > 4 mets of activity.  ? ? ?Home Medications  ?  ?Prior to Admission medications   ?Medication Sig Start Date End Date Taking? Authorizing Provider  ?B Complex Vitamins (VITAMIN-B COMPLEX PO) Take 1 tablet by mouth every evening.    [provider]  ?budesonide-formoterol (SYMBICORT) 160-4.5 MCG/ACT inhaler Inhale 2 puffs into the lungs daily as needed (shortness of breath).     [provider]  ?calcium carbonate (TUMS - DOSED IN MG ELEMENTAL CALCIUM) 500 MG chewable tablet Chew 1 tablet by mouth as needed for indigestion or heartburn.    [provider]  ?carboxymethylcellulose (REFRESH PLUS) 0.5 % SOLN Place 2 drops into both eyes 2 (two) times daily as needed (dry/irritated eyes).    [provider]  ?cetirizine (ZYRTEC) 10 MG tablet Take 10 mg by mouth daily as needed for allergies.     [provider]  ?chlorthalidone (HYGROTON) 25 MG tablet Take 25 mg by mouth every other day. 03/01/21   [provider]  ?Cholecalciferol (VITAMIN D3) 50 MCG (2000 UT) TABS Take 2,000 Units by mouth daily.    [provider]  ?diltiazem (CARDIZEM CD) 120 MG 24 hr capsule Take 1 capsule (120 mg total) by mouth daily. 08/17/21    Werner Lean, MD  ?ezetimibe (ZETIA) 10 MG tablet Take 10 mg by mouth daily. 03/01/21   [provider]  ?Ferrous Sulfate (IRON) 142 (45 Fe) MG TBCR Take 1 tablet by mouth daily.    [provider]  ?hydrocortisone 2.5 % cream as needed. 02/28/21   [provider]  ?MELATONIN PO Take 8 mg by mouth at bedtime.    [provider]  ?Omega-3 Fatty Acids (FISH OIL ULTRA) 1400 MG CAPS Take 1,400 mg by mouth daily.    [provider]  ?pantoprazole (PROTONIX) 20 MG tablet Take 20 mg by mouth every other day.    [provider]  ?Probiotic Product (ALIGN) 4 MG CAPS Take 4 mg by mouth daily.    [provider]  ?sulfaSALAzine (AZULFIDINE) 500 MG EC tablet TAKE 2 TABLETS BY MOUTH TWICE DAILY 05/28/21   Mauri Pole, MD  ?tiotropium (SPIRIVA) 18 MCG inhalation capsule Place 18 mcg into inhaler and inhale daily as needed (asthma).  [provider]  ?VENTOLIN HFA 108 (90 BASE) MCG/ACT inhaler Inhale 1 puff into the lungs as needed for wheezing or shortness of breath.  11/04/14   [provider]  ?Wheat Dextrin (BENEFIBER PO) Take 1 Dose by mouth daily.    [provider]  ? ? ?Physical Exam  ?  ?Vital Signs:  MARQUESHA ROBIDEAU does not have vital signs available for review today. ? ?Given telephonic nature of communication, physical exam is limited. ?AAOx3. NAD. Normal affect.  Speech and respirations are unlabored. ? ?Accessory Clinical Findings  ?  ?None ? ?Assessment & Plan  ?  ?1.  Preoperative Cardiovascular Risk Assessment: ? ?Given past medical history and time since last visit, based on ACC/AHA guidelines, BASSY FETTERLY would be at acceptable risk for the planned procedure without further cardiovascular testing.  ? ? ? ?A copy of this note will be routed to requesting surgeon. ? ?Time:   ?Today, I have spent 5 minutes with the patient with telehealth technology discussing medical history, symptoms, and management  plan.   ? ? ?Leanor Kail, [place] ? ?08/29/2021, 9:47 AM  ?

## 2021-09-16 ENCOUNTER — Encounter (HOSPITAL_COMMUNITY): Payer: Self-pay | Admitting: Emergency Medicine

## 2021-09-16 ENCOUNTER — Ambulatory Visit (HOSPITAL_COMMUNITY)
Admission: EM | Admit: 2021-09-16 | Discharge: 2021-09-16 | Disposition: A | Discharge status: Home / Self Care | Payer: PPO | Attending: Internal Medicine | Admitting: Internal Medicine

## 2021-09-16 DIAGNOSIS — R350 Frequency of micturition: Secondary | ICD-10-CM

## 2021-09-16 DIAGNOSIS — R300 Dysuria: Secondary | ICD-10-CM

## 2021-09-16 DIAGNOSIS — N3090 Cystitis, unspecified without hematuria: Secondary | ICD-10-CM

## 2021-09-16 LAB — POCT URINALYSIS DIPSTICK, ED / UC
Bilirubin Urine: NEGATIVE
Glucose, UA: 100 mg/dL — AB
Ketones, ur: NEGATIVE mg/dL
Nitrite: POSITIVE — AB
Protein, ur: 30 mg/dL — AB
Specific Gravity, Urine: <=1.005 (ref 1.005–1.030)
Urobilinogen, UA: 1 mg/dL (ref 0.0–1.0)
pH: 5.5 (ref 5.0–8.0)

## 2021-09-16 MED ORDER — NITROFURANTOIN MONOHYD MACRO 100 MG PO CAPS: 100.0000 mg | ORAL_CAPSULE | Freq: Two times a day (BID) | ORAL | 0 refills | Status: DC

## 2021-09-16 NOTE — ED Provider Notes (Signed)
?[facility] ? ? ? ?CSN: [id number] ?Arrival date & time: 09/16/21  1542 ? ? ?  ? ?History   ?Chief Complaint ?Chief Complaint  ?Patient presents with  ? Dysuria  ? ? ?HPI ?Ashley Rivera is a 81 y.o. female.  ? ?81 year old female presents with dysuria patient relates for the past 3 days she has had progressive frequency, urgency, and dysuria.  Patient relates she has had also some lower back pain, and some suprapubic pain.  Patient relates she does not have any fever or chills.  And patient relates she does get frequent urinary tract or cystitis type infections.  Is been taking Azo Standard for relief of the dysuria. ? ? ?Dysuria ? ?Past Medical History:  ?Diagnosis Date  ? Allergy   ? Asthma   ? bronchial asthma  ? Cataract   ? left eye  ? Complication of anesthesia   ? Became congested after colonoscopy  ? COPD (chronic obstructive pulmonary disease) ([place])   ? Diverticulosis   ? Emphysema of lung ([place])   ? Genital warts   ? Dx about 30 years ago   ? GERD (gastroesophageal reflux disease)   ? Hemorrhoids   ? History of hiatal hernia   ? History of kidney stones   ? Hx of colonic polyp   ? Hx: UTI (urinary tract infection)   ? Osteoporosis   ? Pneumonia   ? Proctitis 11-12-11  ? Skin cancer   ? basal cell  ? Urinary incontinence   ? On occasion  ? ? ?Patient Active Problem List  ? Diagnosis Date Noted  ? Frequent PVCs 06/20/2021  ? Unspecified open wound, right lower leg, initial encounter 04/23/2021  ? Hardening of the aorta (main artery of the heart) ([place]) 04/23/2021  ? Essential hypertension 04/23/2021  ? Anemia 04/23/2021  ? Meningioma ([place]) 07/10/2020  ? Polyp of rectum   ? Chronic obstructive pulmonary disease ([place]) 01/28/2020  ? Coronary artery calcification 01/28/2020  ? SOB (shortness of breath) 01/28/2020  ? Polyp of cecum   ? Non-thrombocytopenic purpura ([place]) 11/11/2015  ? Microscopic hematuria 10/26/2015  ? 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 Diaphragmatic hernia 03/03/2015  ? GERD  (gastroesophageal reflux disease) 01/17/2015  ? Vitamin D deficiency 10/31/2014  ? Peripheral venous insufficiency 09/16/2012  ? Pruritus ani 12/04/2011  ? Proctitis 12/04/2011  ? Personal history of colonic polyps 10/22/2011  ? Change in bowel habits 10/22/2011  ? Internal hemorrhoids without mention of complication 79/89/2119  ? Nicotine dependence 02/04/2009  ? Hyperlipidemia 02/04/2009  ? Age-related osteoporosis without current pathological fracture 02/04/2009  ? ? ?Past Surgical History:  ?Procedure Laterality Date  ? BIOPSY  07/03/2020  ? Procedure: BIOPSY;  Surgeon: Mauri Pole, MD;  [facility];  Service: Endoscopy;;  ? COLONOSCOPY    ? COLONOSCOPY WITH PROPOFOL N/A 10/28/2019  ? Procedure: COLONOSCOPY WITH PROPOFOL  LARGE CECAL POLYP;  Surgeon: Mauri Pole, MD;  [facility];  Service: Endoscopy;  Laterality: N/A;  ? COLONOSCOPY WITH PROPOFOL N/A 07/03/2020  ? Procedure: COLONOSCOPY WITH PROPOFOL;  Surgeon: Mauri Pole, MD;  [facility];  Service: Endoscopy;  Laterality: N/A;  ? CYSTOSCOPY/URETEROSCOPY/HOLMIUM LASER/STENT PLACEMENT Right 11/09/2019  ? Procedure: CYSTOSCOPY/URETEROSCOPY/HOLMIUM LASER/STENT PLACEMENT;  Surgeon: Festus Aloe, MD;  [facility]: [facility];  Service: Urology;  Laterality: Right;  ? DILATION AND CURETTAGE OF UTERUS    ? 50 years ago. Removed rest of placenta from birth  ? ENDOSCOPIC MUCOSAL RESECTION N/A 10/28/2019  ?  Procedure: ENDOSCOPIC MUCOSAL RESECTION;  Surgeon: Mauri Pole, MD;  [facility];  Service: Endoscopy;  Laterality: N/A;  ? EYE SURGERY    ? cataract  ? HEMOSTASIS CLIP PLACEMENT  10/28/2019  ? Procedure: HEMOSTASIS CLIP PLACEMENT;  Surgeon: Mauri Pole, MD;  [facility];  Service: Endoscopy;;  ? INNER EAR SURGERY Right 2006  ? Tumor removed   ? POLYPECTOMY  10/28/2019  ? Procedure: POLYPECTOMY;  Surgeon: Mauri Pole, MD;  [facility];  Service: Endoscopy;;  ?  POLYPECTOMY  07/03/2020  ? Procedure: POLYPECTOMY;  Surgeon: Mauri Pole, MD;  [facility];  Service: Endoscopy;;  ? SUBMUCOSAL LIFTING INJECTION  10/28/2019  ? Procedure: SUBMUCOSAL LIFTING INJECTION;  Surgeon: Mauri Pole, MD;  [facility];  Service: Endoscopy;;  ? SUBMUCOSAL TATTOO INJECTION  10/28/2019  ? Procedure: SUBMUCOSAL TATTOO INJECTION;  Surgeon: Mauri Pole, MD;  [facility];  Service: Endoscopy;;  ? [place]  ? ? ?OB History   ? ? Gravida  ?2  ? Para  ?2  ? Term  ?2  ? Preterm  ?   ? AB  ?   ? Living  ?2  ?  ? ? SAB  ?   ? IAB  ?   ? Ectopic  ?   ? Multiple  ?   ? Live Births  ?2  ?   ?  ?  ? ? ? ?Home Medications   ? ?Prior to Admission medications   ?Medication Sig Start Date End Date Taking? Authorizing Provider  ?nitrofurantoin, macrocrystal-monohydrate, (MACROBID) 100 MG capsule Take 1 capsule (100 mg total) by mouth 2 (two) times daily. 09/16/21  Yes Nyoka Lint, PA-C  ?B Complex Vitamins (VITAMIN-B COMPLEX PO) Take 1 tablet by mouth every evening.    [provider]  ?budesonide-formoterol (SYMBICORT) 160-4.5 MCG/ACT inhaler Inhale 2 puffs into the lungs daily as needed (shortness of breath).     [provider]  ?calcium carbonate (TUMS - DOSED IN MG ELEMENTAL CALCIUM) 500 MG chewable tablet Chew 1 tablet by mouth as needed for indigestion or heartburn.    [provider]  ?carboxymethylcellulose (REFRESH PLUS) 0.5 % SOLN Place 2 drops into both eyes 2 (two) times daily as needed (dry/irritated eyes).    [provider]  ?cetirizine (ZYRTEC) 10 MG tablet Take 10 mg by mouth daily as needed for allergies.     [provider]  ?chlorthalidone (HYGROTON) 25 MG tablet Take 25 mg by mouth every other day. 03/01/21   [provider]  ?Cholecalciferol (VITAMIN D3) 50 MCG (2000 UT) TABS Take 2,000 Units by mouth daily.    [provider]  ?diltiazem (CARDIZEM  CD) 120 MG 24 hr capsule Take 1 capsule (120 mg total) by mouth daily. 08/17/21   Werner Lean, MD  ?ezetimibe (ZETIA) 10 MG tablet Take 10 mg by mouth daily. 03/01/21   [provider]  ?Ferrous Sulfate (IRON) 142 (45 Fe) MG TBCR Take 1 tablet by mouth daily.    [provider]  ?hydrocortisone 2.5 % cream as needed. 02/28/21   [provider]  ?MELATONIN PO Take 8 mg by mouth at bedtime.    [provider]  ?Omega-3 Fatty Acids (FISH OIL ULTRA) 1400 MG CAPS Take 1,400 mg by mouth daily.    [provider]  ?pantoprazole (PROTONIX) 20 MG tablet Take 20 mg by mouth every other day.    [provider]  ?Probiotic Product (ALIGN) 4 MG CAPS Take 4 mg by mouth daily.    [provider]  ?sulfaSALAzine (AZULFIDINE) 500 MG EC tablet TAKE 2 TABLETS BY MOUTH TWICE DAILY 05/28/21   Mauri Pole, MD  ?tiotropium (SPIRIVA) 18 MCG inhalation capsule Place 18 mcg into inhaler and inhale daily as needed (asthma).     [provider]  ?VENTOLIN HFA 108 (90 BASE) MCG/ACT inhaler Inhale 1 puff into the lungs as needed for wheezing or shortness of breath.  11/04/14   [provider]  ?Wheat Dextrin (BENEFIBER PO) Take 1 Dose by mouth daily.    [provider]  ? ? ?Family History ?Family History  ?Problem Relation Age of Onset  ? Lung cancer Mother   ? Colon cancer Paternal Grandmother   ? Breast cancer Maternal Aunt   ? Esophageal cancer Neg Hx   ? Rectal cancer Neg Hx   ? Stomach cancer Neg Hx   ? Pancreatic cancer Neg Hx   ? ? ?Social History ?Social History  ? ?Tobacco Use  ? Smoking status: Former  ?  Years: 30.00  ?  Types: Cigarettes  ?  Quit date: 10/05/2007  ?  Years since quitting: 13.9  ? Smokeless tobacco: Never  ?Vaping Use  ? Vaping Use: Never used  ?Substance Use Topics  ? Alcohol use: Yes  ?  Alcohol/week: 2.0 standard drinks  ?  Types: 2 Glasses of wine per week  ?  Comment: once a week  ? Drug use: No   ? ? ? ?Allergies   ?Cephalexin ? ? ?Review of Systems ?Review of Systems  ?Genitourinary:  Positive for dysuria, frequency and urgency.  ? ? ?Physical Exam ?Triage Vital Signs ?ED Triage Vitals  ?Enc Vitals Group  ?   BP 09/16/21

## 2021-09-16 NOTE — Discharge Instructions (Signed)
Advised to increase fluid intake. ?

## 2021-09-16 NOTE — ED Notes (Signed)
Urinary Frequency, burning and lower abdominal pain x 3 days, taking AZO   ?

## 2021-09-16 NOTE — ED Triage Notes (Signed)
Pt c/o urine frequency, burning and lower abd pains for 3 days. Reports taking AZO for symptoms.  ?

## 2021-09-17 LAB — URINE CULTURE

## 2021-09-19 DIAGNOSIS — C44622 Squamous cell carcinoma of skin of right upper limb, including shoulder: Secondary | ICD-10-CM | POA: Diagnosis not present

## 2021-10-03 DIAGNOSIS — J449 Chronic obstructive pulmonary disease, unspecified: Secondary | ICD-10-CM | POA: Diagnosis not present

## 2021-10-03 DIAGNOSIS — I10 Essential (primary) hypertension: Secondary | ICD-10-CM | POA: Diagnosis not present

## 2021-10-24 DIAGNOSIS — D329 Benign neoplasm of meninges, unspecified: Secondary | ICD-10-CM | POA: Diagnosis not present

## 2021-10-24 DIAGNOSIS — I10 Essential (primary) hypertension: Secondary | ICD-10-CM | POA: Diagnosis not present

## 2021-10-24 DIAGNOSIS — K5120 Ulcerative (chronic) proctitis without complications: Secondary | ICD-10-CM | POA: Diagnosis not present

## 2021-10-24 DIAGNOSIS — E785 Hyperlipidemia, unspecified: Secondary | ICD-10-CM | POA: Diagnosis not present

## 2021-10-24 DIAGNOSIS — Z87891 Personal history of nicotine dependence: Secondary | ICD-10-CM | POA: Diagnosis not present

## 2021-10-24 DIAGNOSIS — J449 Chronic obstructive pulmonary disease, unspecified: Secondary | ICD-10-CM | POA: Diagnosis not present

## 2021-10-24 DIAGNOSIS — D692 Other nonthrombocytopenic purpura: Secondary | ICD-10-CM | POA: Diagnosis not present

## 2021-10-24 DIAGNOSIS — I471 Supraventricular tachycardia: Secondary | ICD-10-CM | POA: Diagnosis not present

## 2021-10-24 DIAGNOSIS — I700 Atherosclerosis of aorta: Secondary | ICD-10-CM | POA: Diagnosis not present

## 2021-10-24 DIAGNOSIS — D649 Anemia, unspecified: Secondary | ICD-10-CM | POA: Diagnosis not present

## 2021-10-24 DIAGNOSIS — R350 Frequency of micturition: Secondary | ICD-10-CM | POA: Diagnosis not present

## 2021-11-05 DIAGNOSIS — H16232 Neurotrophic keratoconjunctivitis, left eye: Secondary | ICD-10-CM | POA: Diagnosis not present

## 2021-11-05 DIAGNOSIS — H10502 Unspecified blepharoconjunctivitis, left eye: Secondary | ICD-10-CM | POA: Diagnosis not present

## 2021-11-15 DIAGNOSIS — H1820 Unspecified corneal edema: Secondary | ICD-10-CM | POA: Diagnosis not present

## 2021-11-15 DIAGNOSIS — H10502 Unspecified blepharoconjunctivitis, left eye: Secondary | ICD-10-CM | POA: Diagnosis not present

## 2021-11-15 DIAGNOSIS — H16232 Neurotrophic keratoconjunctivitis, left eye: Secondary | ICD-10-CM | POA: Diagnosis not present

## 2021-11-15 DIAGNOSIS — H1789 Other corneal scars and opacities: Secondary | ICD-10-CM | POA: Diagnosis not present

## 2021-11-19 ENCOUNTER — Encounter (HOSPITAL_BASED_OUTPATIENT_CLINIC_OR_DEPARTMENT_OTHER): Payer: Self-pay | Admitting: Surgery

## 2021-11-19 NOTE — Progress Notes (Addendum)
Spoke w/ via phone for pre-op interview--- [facility] needs dos---- ISTAT. Surgeon orders pending.            Lab results------ Current EKG in Epic dated 06/2021. COVID test -----patient states asymptomatic no test needed Arrive at -------0630 NPO after MN NO Solid Food.   Med rec completed Medications to take morning of surgery -----Zetia, Cardizem, Symbicort, bring albuterol inhaler. Diabetic medication ----- Patient instructed no nail polish to be worn day of surgery Patient instructed to bring photo id and insurance card day of surgery Patient aware to have Driver (ride ) / caregiver Husband Afnan Cadiente   for 24 hours after surgery  Patient Special Instructions ----- Pre-Op special Istructions ----- Patient verbalized understanding of instructions that were given at this phone interview. Patient denies shortness of breath, chest pain, fever, cough at this phone interview.

## 2021-11-21 ENCOUNTER — Ambulatory Visit: Payer: Self-pay | Admitting: Surgery

## 2021-11-25 NOTE — Progress Notes (Signed)
Cardiology Office Note:    Date:  11/26/2021   ID:  Ashley Rivera, DOB 03-13-1941, MRN [id number]  PCP:  Michael Boston, MD  [facility] Cardiologist:  Werner Lean, MD  [facility] Electrophysiologist:  None   Referring MD: Michael Boston, MD   CC: Pre-operative eavl  History of Present Illness:    Ashley Rivera is a 81 y.o. female with a hx of HTN, HLD, Rare PVCs, COPD, who established in 2021 for stress testing.  Had grossly normal stress test 2021.  2023: Had SVT with frequent PVCS, started on diltiazem; Had hemorrhoidectomy without complication  Patient notes that she is doing well.   Feels tired but otherwise is doing ok. There are no interval hospital/ED visit.   No change in her shortness of breath from prior; worse in the heat.  Notes that she still have rare palpitations: when she is laying in bed after going to the restroom in the morning.  No SOB/DOE and no PND/Orthopnea.  No weight gain or leg swelling.  No palpitations or syncope .  Patient notes that she thought she was having ventricular tachycardia not SVT.  When she read my MyChart Message she thought that supraventricular tachycardia meant a whole lot of VT, and that here rare PVCs were a rare disorder.  We clarified her results today.   Past Medical History:  Diagnosis Date   Allergy    Asthma    bronchial asthma   Cataract    left eye   Complication of anesthesia    Became congested after colonoscopy   COPD (chronic obstructive pulmonary disease) (HCC)    Diverticulosis    Emphysema of lung ([facility])    Genital warts    Dx about 30 years ago    GERD (gastroesophageal reflux disease)    Hemorrhoids    History of hiatal hernia    History of kidney stones    Hx of colonic polyp    Hx: UTI (urinary tract infection)    Hypertension    Osteoporosis    Pneumonia    Proctitis 11/12/2011   Skin cancer    basal cell   Urinary incontinence    On occasion    Past Surgical History:   Procedure Laterality Date   BIOPSY  07/03/2020   Procedure: BIOPSY;  Surgeon: Mauri Pole, MD;  [facility];  Service: Endoscopy;;   COLONOSCOPY     COLONOSCOPY WITH PROPOFOL N/A 10/28/2019   Procedure: COLONOSCOPY WITH PROPOFOL  LARGE CECAL POLYP;  Surgeon: Mauri Pole, MD;  [facility];  Service: Endoscopy;  Laterality: N/A;   COLONOSCOPY WITH PROPOFOL N/A 07/03/2020   Procedure: COLONOSCOPY WITH PROPOFOL;  Surgeon: Mauri Pole, MD;  [facility];  Service: Endoscopy;  Laterality: N/A;   CYSTOSCOPY/URETEROSCOPY/HOLMIUM LASER/STENT PLACEMENT Right 11/09/2019   Procedure: CYSTOSCOPY/URETEROSCOPY/HOLMIUM LASER/STENT PLACEMENT;  Surgeon: Festus Aloe, MD;  [facility];  Service: Urology;  Laterality: Right;   DILATION AND CURETTAGE OF UTERUS     50 years ago. Removed rest of placenta from birth   [place] N/A 10/28/2019   Procedure: ENDOSCOPIC MUCOSAL RESECTION;  Surgeon: Mauri Pole, MD;  [facility];  Service: Endoscopy;  Laterality: N/A;   EYE SURGERY     cataract   HEMOSTASIS CLIP PLACEMENT  10/28/2019   Procedure: HEMOSTASIS CLIP PLACEMENT;  Surgeon: Mauri Pole, MD;  [facility];  Service: Endoscopy;;   INNER EAR SURGERY Right 2006  Tumor removed    POLYPECTOMY  10/28/2019   Procedure: POLYPECTOMY;  Surgeon: Mauri Pole, MD;  [facility];  Service: Endoscopy;;   POLYPECTOMY  07/03/2020   Procedure: POLYPECTOMY;  Surgeon: Mauri Pole, MD;  [facility];  Service: Endoscopy;;   SUBMUCOSAL LIFTING INJECTION  10/28/2019   Procedure: SUBMUCOSAL LIFTING INJECTION;  Surgeon: Mauri Pole, MD;  [facility];  Service: Endoscopy;;   SUBMUCOSAL TATTOO INJECTION  10/28/2019   Procedure: SUBMUCOSAL TATTOO INJECTION;  Surgeon: Mauri Pole, MD;  [facility];  Service: Endoscopy;;   TONSILLECTOMY AND ADENOIDECTOMY   1950   Current Medications: Current Meds  Medication Sig   B Complex Vitamins (VITAMIN-B COMPLEX PO) Take 1 tablet by mouth every evening.   budesonide-formoterol (SYMBICORT) 160-4.5 MCG/ACT inhaler Inhale 2 puffs into the lungs daily as needed (shortness of breath).    calcium carbonate (TUMS - DOSED IN MG ELEMENTAL CALCIUM) 500 MG chewable tablet Chew 1 tablet by mouth as needed for indigestion or heartburn.   carboxymethylcellulose (REFRESH PLUS) 0.5 % SOLN Place 2 drops into both eyes 2 (two) times daily as needed (dry/irritated eyes).   cetirizine (ZYRTEC) 10 MG tablet Take 10 mg by mouth daily as needed for allergies.    chlorthalidone (HYGROTON) 25 MG tablet Take 25 mg by mouth every other day.   Cholecalciferol (VITAMIN D3) 50 MCG (2000 UT) TABS Take 2,000 Units by mouth daily.   diltiazem (CARDIZEM CD) 120 MG 24 hr capsule Take 1 capsule (120 mg total) by mouth daily.   ezetimibe (ZETIA) 10 MG tablet Take 10 mg by mouth daily.   Ferrous Sulfate (IRON) 142 (45 Fe) MG TBCR Take 1 tablet by mouth daily.   hydrocortisone 2.5 % cream as needed.   MELATONIN PO Take 8 mg by mouth at bedtime.   Omega-3 Fatty Acids (FISH OIL ULTRA) 1400 MG CAPS Take 1,400 mg by mouth daily.   pantoprazole (PROTONIX) 20 MG tablet Take 20 mg by mouth as needed for heartburn or indigestion.   prednisoLONE acetate (PRED FORTE) 1 % ophthalmic suspension Place 1 drop into the left eye 2 (two) times daily.   Probiotic Product (ALIGN) 4 MG CAPS Take 4 mg by mouth daily.   sulfaSALAzine (AZULFIDINE) 500 MG EC tablet TAKE 2 TABLETS BY MOUTH TWICE DAILY   tiotropium (SPIRIVA) 18 MCG inhalation capsule Place 18 mcg into inhaler and inhale daily as needed (asthma).    VENTOLIN HFA 108 (90 BASE) MCG/ACT inhaler Inhale 1 puff into the lungs as needed for wheezing or shortness of breath.    vitamin C (ASCORBIC ACID) 500 MG tablet Take 500 mg by mouth daily.   Wheat Dextrin (BENEFIBER PO) Take 1 Dose by mouth daily.     Allergies:   Cephalexin   Social History   Socioeconomic History   Marital status: Married    Spouse name: Not on file   Number of children: 2   Years of education: Not on file   Highest education level: Not on file  Occupational History   Occupation: Retired   Tobacco Use   Smoking status: Former    Years: 30.00    Types: Cigarettes    Quit date: 10/05/2007    Years since quitting: 14.1   Smokeless tobacco: Never  Vaping Use   Vaping Use: Never used  Substance and Sexual Activity   Alcohol use: Yes    Alcohol/week: 2.0 standard drinks of alcohol    Types: 2 Glasses  of wine per week    Comment: once a week   Drug use: No   Sexual activity: Not Currently    Partners: Male    Birth control/protection: Post-menopausal  Other Topics Concern   Not on file  Social History Narrative   Daily caffeine    Social Determinants of Health   Financial Resource Strain: Not on file  Food Insecurity: Not on file  Transportation Needs: Not on file  Physical Activity: Not on file  Stress: Not on file  Social Connections: Not on file    Social: missed last visit as her husband had MI (2022)  Family History: The patient's family history includes Breast cancer in her maternal aunt; Colon cancer in her paternal grandmother; Lung cancer in her mother. There is no history of Esophageal cancer, Rectal cancer, Stomach cancer, or Pancreatic cancer.  ROS:   Please see the history of present illness.    All other systems reviewed and are negative.  EKGs/Labs/Other Studies Reviewed:    The following studies were reviewed today:  EKG:   06/20/21: SR rate 81, rare polymoprhic PVCs  Recent Labs: 08/02/2021: Hemoglobin 12.7; Platelets 153.0   Physical Exam:    VS:  BP 130/60   Pulse 85   Ht 5' 1"  (1.549 m)   Wt 146 lb (66.2 kg)   SpO2 96%   BMI 27.59 kg/m     Wt Readings from Last 3 Encounters:  11/26/21 146 lb (66.2 kg)  08/02/21 148 lb 2 oz (67.2 kg)  06/20/21 143 lb (64.9 kg)     Gen: no distress, elderly female   Neck: No JVD Cardiac: No Rubs or Gallops, no murmur, RRR +2 radial pulses Respiratory: Clear to auscultation bilaterally, normal effort, normal  respiratory rate GI: Soft, nontender, non-distended  MS: no edema;  moves all extremities Integument: Skin feels warm Neuro:  At time of evaluation, alert and oriented to person/place/time/situation  Psych: Normal affect, patient feels OK   ASSESSMENT:    1. PAC (premature atrial contraction)   2. Paroxysmal SVT (supraventricular tachycardia) ([place])   3. Chronic obstructive pulmonary disease, unspecified COPD type ([place])     PLAN:    Frequent PACs and SVT HTN HLD with LAD CAC Former Tobacco Abuse COPD - in subsequent visits will attempt to work on risk stratification (LDL < 55) - Per patient request we will not leave message on mobile phone; in the future I will try to call with worrisome results and if she has questions about this   - continue current diltiazem, no change to her medication, continue zetia  No barriers to upcoming surgery    Medication Adjustments/Labs and Tests Ordered: Current medicines are reviewed at length with the patient today.  Concerns regarding medicines are outlined above.  No orders of the defined types were placed in this encounter.  No orders of the defined types were placed in this encounter.   Patient Instructions  Medication Instructions:  Your physician recommends that you continue on your current medications as directed. Please refer to the Current Medication list given to you today.  *If you need a refill on your cardiac medications before your next appointment, please call your pharmacy*   Lab Work: NONE If you have labs (blood work) drawn today and your tests are completely normal, you will receive your results only by: [place] (if you have MyChart) OR A paper copy in the mail If you have any lab test that is abnormal or we need  to change  your treatment, we will call you to review the results.   Testing/Procedures: NONE   Follow-Up: At [facility], you and your health needs are our priority.  As part of our continuing mission to provide you with exceptional heart care, we have created designated Provider Care Teams.  These Care Teams include your primary Cardiologist (physician) and Advanced Practice Providers (APPs -  Physician Assistants and Nurse Practitioners) who all work together to provide you with the care you need, when you need it.  Your next appointment:   1 year(s)  The format for your next appointment:   In Person  Provider:   Werner Lean, MD    Important Information About Sugar         Signed, Werner Lean, MD  11/26/2021 3:09 PM    [place]

## 2021-11-26 ENCOUNTER — Encounter: Payer: Self-pay | Admitting: Internal Medicine

## 2021-11-26 ENCOUNTER — Ambulatory Visit: Payer: PPO | Admitting: Internal Medicine

## 2021-11-26 VITALS — BP 130/60 | HR 85 | Ht 61.0 in | Wt 146.0 lb

## 2021-11-26 DIAGNOSIS — I471 Supraventricular tachycardia, unspecified: Secondary | ICD-10-CM

## 2021-11-26 DIAGNOSIS — I491 Atrial premature depolarization: Secondary | ICD-10-CM | POA: Diagnosis not present

## 2021-11-26 DIAGNOSIS — J449 Chronic obstructive pulmonary disease, unspecified: Secondary | ICD-10-CM | POA: Diagnosis not present

## 2021-11-26 NOTE — Patient Instructions (Signed)
Medication Instructions:  Your physician recommends that you continue on your current medications as directed. Please refer to the Current Medication list given to you today.  *If you need a refill on your cardiac medications before your next appointment, please call your pharmacy*   Lab Work: NONE If you have labs (blood work) drawn today and your tests are completely normal, you will receive your results only by: [place] (if you have MyChart) OR A paper copy in the mail If you have any lab test that is abnormal or we need to change your treatment, we will call you to review the results.   Testing/Procedures: NONE   Follow-Up: At [facility], you and your health needs are our priority.  As part of our continuing mission to provide you with exceptional heart care, we have created designated Provider Care Teams.  These Care Teams include your primary Cardiologist (physician) and Advanced Practice Providers (APPs -  Physician Assistants and Nurse Practitioners) who all work together to provide you with the care you need, when you need it.  Your next appointment:   1 year(s)  The format for your next appointment:   In Person  Provider:   Werner Lean, MD    Important Information About Sugar

## 2021-11-28 ENCOUNTER — Ambulatory Visit (HOSPITAL_BASED_OUTPATIENT_CLINIC_OR_DEPARTMENT_OTHER)
Admission: RE | Admit: 2021-11-28 | Discharge: 2021-11-28 | Disposition: A | Discharge status: Home / Self Care | Payer: PPO | Attending: Surgery | Admitting: Surgery

## 2021-11-28 ENCOUNTER — Ambulatory Visit (HOSPITAL_BASED_OUTPATIENT_CLINIC_OR_DEPARTMENT_OTHER): Payer: PPO | Admitting: Anesthesiology

## 2021-11-28 ENCOUNTER — Encounter (HOSPITAL_BASED_OUTPATIENT_CLINIC_OR_DEPARTMENT_OTHER)
Admission: RE | Disposition: A | Discharge status: Home / Self Care | Payer: Self-pay | Source: Home / Self Care | Attending: Surgery

## 2021-11-28 ENCOUNTER — Other Ambulatory Visit: Payer: Self-pay

## 2021-11-28 ENCOUNTER — Encounter (HOSPITAL_BASED_OUTPATIENT_CLINIC_OR_DEPARTMENT_OTHER): Payer: Self-pay | Admitting: Surgery

## 2021-11-28 DIAGNOSIS — I10 Essential (primary) hypertension: Secondary | ICD-10-CM | POA: Insufficient documentation

## 2021-11-28 DIAGNOSIS — J449 Chronic obstructive pulmonary disease, unspecified: Secondary | ICD-10-CM | POA: Insufficient documentation

## 2021-11-28 DIAGNOSIS — K644 Residual hemorrhoidal skin tags: Secondary | ICD-10-CM | POA: Diagnosis not present

## 2021-11-28 DIAGNOSIS — K629 Disease of anus and rectum, unspecified: Secondary | ICD-10-CM | POA: Insufficient documentation

## 2021-11-28 DIAGNOSIS — K648 Other hemorrhoids: Secondary | ICD-10-CM | POA: Diagnosis not present

## 2021-11-28 DIAGNOSIS — I2510 Atherosclerotic heart disease of native coronary artery without angina pectoris: Secondary | ICD-10-CM | POA: Diagnosis not present

## 2021-11-28 DIAGNOSIS — Z87891 Personal history of nicotine dependence: Secondary | ICD-10-CM | POA: Diagnosis not present

## 2021-11-28 DIAGNOSIS — K6289 Other specified diseases of anus and rectum: Secondary | ICD-10-CM | POA: Diagnosis not present

## 2021-11-28 DIAGNOSIS — K649 Unspecified hemorrhoids: Secondary | ICD-10-CM

## 2021-11-28 HISTORY — DX: Essential (primary) hypertension: I10

## 2021-11-28 HISTORY — PX: EVALUATION UNDER ANESTHESIA WITH HEMORRHOIDECTOMY: SHX5624

## 2021-11-28 LAB — POCT I-STAT, CHEM 8
BUN: 22 mg/dL (ref 8–23)
Calcium, Ion: 1.4 mmol/L (ref 1.15–1.40)
Chloride: 102 mmol/L (ref 98–111)
Creatinine, Ser: 0.7 mg/dL (ref 0.44–1.00)
Glucose, Bld: 98 mg/dL (ref 70–99)
HCT: 37 % (ref 36.0–46.0)
Hemoglobin: 12.6 g/dL (ref 12.0–15.0)
Potassium: 3.6 mmol/L (ref 3.5–5.1)
Sodium: 142 mmol/L (ref 135–145)
TCO2: 26 mmol/L (ref 22–32)

## 2021-11-28 SURGERY — EXAM UNDER ANESTHESIA WITH HEMORRHOIDECTOMY
Anesthesia: General

## 2021-11-28 MED ORDER — ACETAMINOPHEN 500 MG PO TABS
1000.0000 mg | ORAL_TABLET | ORAL | Status: AC
  Administered 2021-11-28: 1000 mg via ORAL

## 2021-11-28 MED ORDER — DEXAMETHASONE SODIUM PHOSPHATE 10 MG/ML IJ SOLN
INTRAMUSCULAR | Status: AC
  Filled 2021-11-28: qty 1

## 2021-11-28 MED ORDER — TRAMADOL HCL 50 MG PO TABS: 50.0000 mg | ORAL_TABLET | Freq: Four times a day (QID) | ORAL | 0 refills | Status: AC | PRN

## 2021-11-28 MED ORDER — 0.9 % SODIUM CHLORIDE (POUR BTL) OPTIME
TOPICAL | Status: DC | PRN
  Administered 2021-11-28: 500 mL

## 2021-11-28 MED ORDER — FENTANYL CITRATE (PF) 100 MCG/2ML IJ SOLN: 25.0000 ug | INTRAMUSCULAR | Status: DC | PRN

## 2021-11-28 MED ORDER — LACTATED RINGERS IV SOLN: INTRAVENOUS | Status: DC

## 2021-11-28 MED ORDER — PROPOFOL 10 MG/ML IV BOLUS
INTRAVENOUS | Status: AC
  Filled 2021-11-28: qty 20

## 2021-11-28 MED ORDER — FLEET ENEMA 7-19 GM/118ML RE ENEM: 1.0000 | ENEMA | Freq: Once | RECTAL | Status: DC

## 2021-11-28 MED ORDER — DEXAMETHASONE SODIUM PHOSPHATE 10 MG/ML IJ SOLN
INTRAMUSCULAR | Status: DC | PRN
  Administered 2021-11-28: 5 mg via INTRAVENOUS

## 2021-11-28 MED ORDER — ONDANSETRON HCL 4 MG/2ML IJ SOLN
INTRAMUSCULAR | Status: DC | PRN
  Administered 2021-11-28: 4 mg via INTRAVENOUS

## 2021-11-28 MED ORDER — BUPIVACAINE-EPINEPHRINE 0.25% -1:200000 IJ SOLN
INTRAMUSCULAR | Status: DC | PRN
  Administered 2021-11-28: 15 mL

## 2021-11-28 MED ORDER — FENTANYL CITRATE (PF) 100 MCG/2ML IJ SOLN
INTRAMUSCULAR | Status: DC | PRN
  Administered 2021-11-28: 50 ug via INTRAVENOUS

## 2021-11-28 MED ORDER — LIDOCAINE 2% (20 MG/ML) 5 ML SYRINGE
INTRAMUSCULAR | Status: DC | PRN
  Administered 2021-11-28: 60 mg via INTRAVENOUS

## 2021-11-28 MED ORDER — FENTANYL CITRATE (PF) 100 MCG/2ML IJ SOLN
INTRAMUSCULAR | Status: AC
  Filled 2021-11-28: qty 2

## 2021-11-28 MED ORDER — ACETAMINOPHEN 500 MG PO TABS
ORAL_TABLET | ORAL | Status: AC
  Filled 2021-11-28: qty 2

## 2021-11-28 MED ORDER — ONDANSETRON HCL 4 MG/2ML IJ SOLN
INTRAMUSCULAR | Status: AC
  Filled 2021-11-28: qty 2

## 2021-11-28 MED ORDER — AMISULPRIDE (ANTIEMETIC) 5 MG/2ML IV SOLN: 10.0000 mg | Freq: Once | INTRAVENOUS | Status: DC | PRN

## 2021-11-28 MED ORDER — PROPOFOL 10 MG/ML IV BOLUS
INTRAVENOUS | Status: DC | PRN
  Administered 2021-11-28: 120 mg via INTRAVENOUS

## 2021-11-28 MED ORDER — BUPIVACAINE LIPOSOME 1.3 % IJ SUSP: 20.0000 mL | Freq: Once | INTRAMUSCULAR | Status: DC

## 2021-11-28 MED ORDER — ROCURONIUM BROMIDE 10 MG/ML (PF) SYRINGE
PREFILLED_SYRINGE | INTRAVENOUS | Status: DC | PRN
  Administered 2021-11-28: 40 mg via INTRAVENOUS

## 2021-11-28 MED ORDER — SUGAMMADEX SODIUM 200 MG/2ML IV SOLN
INTRAVENOUS | Status: DC | PRN
  Administered 2021-11-28: 140 mg via INTRAVENOUS

## 2021-11-28 MED ORDER — ROCURONIUM BROMIDE 10 MG/ML (PF) SYRINGE
PREFILLED_SYRINGE | INTRAVENOUS | Status: AC
  Filled 2021-11-28: qty 10

## 2021-11-28 MED ORDER — BUPIVACAINE LIPOSOME 1.3 % IJ SUSP
INTRAMUSCULAR | Status: DC | PRN
  Administered 2021-11-28: 20 mL

## 2021-11-28 MED ORDER — LIDOCAINE HCL (PF) 2 % IJ SOLN
INTRAMUSCULAR | Status: AC
  Filled 2021-11-28: qty 5

## 2021-11-28 SURGICAL SUPPLY — 39 items
BLADE EXTENDED COATED 6.5IN (ELECTRODE) ×2 IMPLANT
BLADE SURG 15 STRL LF DISP TIS (BLADE) IMPLANT
BLADE SURG 15 STRL SS (BLADE)
COVER BACK TABLE 60X90IN (DRAPES) ×2 IMPLANT
COVER MAYO STAND STRL (DRAPES) ×2 IMPLANT
DRAPE LAPAROTOMY 100X72 PEDS (DRAPES) ×2 IMPLANT
DRAPE UTILITY XL STRL (DRAPES) ×2 IMPLANT
DRSG PAD ABDOMINAL 8X10 ST (GAUZE/BANDAGES/DRESSINGS) ×1 IMPLANT
ELECT REM PT RETURN 9FT ADLT (ELECTROSURGICAL) ×2
ELECTRODE REM PT RTRN 9FT ADLT (ELECTROSURGICAL) ×1 IMPLANT
GAUZE 4X4 16PLY ~~LOC~~+RFID DBL (SPONGE) ×2 IMPLANT
GAUZE SPONGE 4X4 12PLY STRL (GAUZE/BANDAGES/DRESSINGS) ×2 IMPLANT
GLOVE BIO SURGEON STRL SZ7.5 (GLOVE) ×2 IMPLANT
GLOVE BIOGEL PI IND STRL 8 (GLOVE) ×1 IMPLANT
GLOVE BIOGEL PI INDICATOR 8 (GLOVE) ×1
GOWN STRL REUS W/TWL XL LVL3 (GOWN DISPOSABLE) ×2 IMPLANT
KIT SIGMOIDOSCOPE (SET/KITS/TRAYS/PACK) IMPLANT
KIT TURNOVER CYSTO (KITS) ×2 IMPLANT
LIGASURE 7.4 SM JAW OPEN (ELECTROSURGICAL) IMPLANT
LOOP VESSEL MAXI BLUE (MISCELLANEOUS) IMPLANT
NEEDLE HYPO 22GX1.5 SAFETY (NEEDLE) ×3 IMPLANT
NS IRRIG 500ML POUR BTL (IV SOLUTION) ×2 IMPLANT
PACK BASIN DAY SURGERY FS (CUSTOM PROCEDURE TRAY) ×3 IMPLANT
PAD ARMBOARD 7.5X6 YLW CONV (MISCELLANEOUS) IMPLANT
PANTS MESH DISP LRG (UNDERPADS AND DIAPERS) ×2 IMPLANT
PENCIL SMOKE EVACUATOR (MISCELLANEOUS) ×3 IMPLANT
SPONGE HEMORRHOID 8X3CM (HEMOSTASIS) ×1 IMPLANT
SPONGE SURGIFOAM ABS GEL 100 (HEMOSTASIS) IMPLANT
SPONGE SURGIFOAM ABS GEL 12-7 (HEMOSTASIS) IMPLANT
SUT VIC AB 2-0 UR6 27 (SUTURE) ×4 IMPLANT
SUT VIC AB 3-0 SH 27 (SUTURE) ×2
SUT VIC AB 3-0 SH 27X BRD (SUTURE) ×1 IMPLANT
SYR BULB EAR ULCER 3OZ GRN STR (SYRINGE) IMPLANT
SYR CONTROL 10ML LL (SYRINGE) ×2 IMPLANT
TOWEL OR 17X26 10 PK STRL BLUE (TOWEL DISPOSABLE) ×2 IMPLANT
TRAY DSU PREP LF (CUSTOM PROCEDURE TRAY) ×2 IMPLANT
TUBE CONNECTING 12X1/4 (SUCTIONS) ×2 IMPLANT
WATER STERILE IRR 500ML POUR (IV SOLUTION) IMPLANT
YANKAUER SUCT BULB TIP NO VENT (SUCTIONS) ×2 IMPLANT

## 2021-11-28 NOTE — Anesthesia Postprocedure Evaluation (Signed)
Anesthesia Post Note  Patient: Ashley Rivera  Procedure(s) Performed: Ashley Rivera UNDER ANESTHESIA WITH HEMORRHOIDECTOMY 1 VS 2 COLUMN     Patient location during evaluation: PACU Anesthesia Type: General Level of consciousness: awake and alert Pain management: pain level controlled Vital Signs Assessment: post-procedure vital signs reviewed and stable Respiratory status: spontaneous breathing, nonlabored ventilation, respiratory function stable and patient connected to nasal cannula oxygen Cardiovascular status: blood pressure returned to baseline and stable Postop Assessment: no apparent nausea or vomiting Anesthetic complications: yes   Encounter Notable Events  Notable Event Outcome Phase Comment  Difficult to intubate - expected  Intraprocedure Filed from anesthesia note documentation.    Last Vitals:  Vitals:   11/28/21 1000 11/28/21 1102  BP: 127/64 (!) 143/64  Pulse: 76 75  Resp: 19 17  Temp:  36.5 C  SpO2: 90% 96%    Last Pain:  Vitals:   11/28/21 1102  TempSrc:   PainSc: 0-No pain                 Tiajuana Amass

## 2021-11-28 NOTE — H&P (Signed)
CC: Here today for surgery  HPI: Ashley Rivera is an 81 y.o. 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 Fabio Asa for evaluation of [facility] for prolapsing ulcerated rectal mucosa.  She was seen in their office 08/02/2021 with Ellouise Newer, PA, noted to have hemorrhoidal tissue that was noninflamed or bleeding. Abnormal feeling protrusion. Anoscopy demonstrated ulcerated rectal mucosa and along fingerlike projection. She was referred to [place] for further evaluation.  Last colonoscopy 07/03/2020 demonstrated prolapsed external/internal hemorrhoids. Hemorrhoids were large with ulceration in the left lateral hemorrhoid. Additional polyps were removed.  She reports that for the last at least year if not longer she has had issues with bright red blood per rectum and prolapsing tissue on one side. She denies any evident full-thickness prolapse.  She has had some trouble with mucus related to this prolapsing tissue. Often times it will reduce on its own occasionally soft apply some pressure to assist.  She reports she takes Benefiber generally once daily. She has a bowel movement every day to every other day. She will spend approximately 5 minutes on the commode. She drinks approximately 4 to 5 glasses of water per day. She rarely has to strain. Her stools are generally soft.  INTERVAL HX States she is ready for surgery. Denies any changes in her health or health history since we met in the office.   PMH: CUC (on Azathioprine), COPD  PSH: No prior anorectal procedures or surgeries  FHx: Maternal aunt had breast cancer. Paternal grandmother had colon cancer. Denies any other known family history of colorectal, breast, endometrial or ovarian cancer  Social Hx: She is retired - previously worked as Facilities manager. Denies use of tobacco/EtOH/illicit drug.    Past Medical History:  Diagnosis Date   Allergy    Asthma     bronchial asthma   Cataract    left eye   Complication of anesthesia    Became congested after colonoscopy   COPD (chronic obstructive pulmonary disease) (HCC)    Diverticulosis    Emphysema of lung (HCC)    Genital warts    Dx about 30 years ago    GERD (gastroesophageal reflux disease)    Hemorrhoids    History of hiatal hernia    History of kidney stones    Hx of colonic polyp    Hx: UTI (urinary tract infection)    Hypertension    Osteoporosis    Pneumonia    Proctitis 11/12/2011   Skin cancer    basal cell   Urinary incontinence    On occasion    Past Surgical History:  Procedure Laterality Date   BIOPSY  07/03/2020   Procedure: BIOPSY;  Surgeon: Mauri Pole, MD;  Location: [facility];  Service: Endoscopy;;   COLONOSCOPY     COLONOSCOPY WITH PROPOFOL N/A 10/28/2019   Procedure: COLONOSCOPY WITH PROPOFOL  LARGE CECAL POLYP;  Surgeon: Mauri Pole, MD;  Location: [facility];  Service: Endoscopy;  Laterality: N/A;   COLONOSCOPY WITH PROPOFOL N/A 07/03/2020   Procedure: COLONOSCOPY WITH PROPOFOL;  Surgeon: Mauri Pole, MD;  Location: [facility];  Service: Endoscopy;  Laterality: N/A;   CYSTOSCOPY/URETEROSCOPY/HOLMIUM LASER/STENT PLACEMENT Right 11/09/2019   Procedure: CYSTOSCOPY/URETEROSCOPY/HOLMIUM LASER/STENT PLACEMENT;  Surgeon: Festus Aloe, MD;  Location: [facility];  Service: Urology;  Laterality: Right;   DILATION AND CURETTAGE OF UTERUS     50 years ago. Removed rest of placenta from  birth   ENDOSCOPIC MUCOSAL RESECTION N/A 10/28/2019   Procedure: ENDOSCOPIC MUCOSAL RESECTION;  Surgeon: Mauri Pole, MD;  Location: [facility];  Service: Endoscopy;  Laterality: N/A;   EYE SURGERY     cataract   HEMOSTASIS CLIP PLACEMENT  10/28/2019   Procedure: HEMOSTASIS CLIP PLACEMENT;  Surgeon: Mauri Pole, MD;  Location: [facility];  Service: Endoscopy;;   INNER EAR SURGERY Right 2006   Tumor removed    POLYPECTOMY  10/28/2019    Procedure: POLYPECTOMY;  Surgeon: Mauri Pole, MD;  Location: [facility];  Service: Endoscopy;;   POLYPECTOMY  07/03/2020   Procedure: POLYPECTOMY;  Surgeon: Mauri Pole, MD;  Location: [facility];  Service: Endoscopy;;   SUBMUCOSAL LIFTING INJECTION  10/28/2019   Procedure: SUBMUCOSAL LIFTING INJECTION;  Surgeon: Mauri Pole, MD;  Location: [facility];  Service: Endoscopy;;   SUBMUCOSAL TATTOO INJECTION  10/28/2019   Procedure: SUBMUCOSAL TATTOO INJECTION;  Surgeon: Mauri Pole, MD;  Location: [facility];  Service: Endoscopy;;   TONSILLECTOMY AND ADENOIDECTOMY  1950    Family History  Problem Relation Age of Onset   Lung cancer Mother    Colon cancer Paternal Grandmother    Breast cancer Maternal Aunt    Esophageal cancer Neg Hx    Rectal cancer Neg Hx    Stomach cancer Neg Hx    Pancreatic cancer Neg Hx     Social:  reports that she quit smoking about 14 years ago. Her smoking use included cigarettes. She has never used smokeless tobacco. She reports current alcohol use of about 2.0 standard drinks of alcohol per week. She reports that she does not use drugs.  Allergies:  Allergies  Allergen Reactions   Cephalexin     Other reaction(s): rash on chest/hives    Medications: I have reviewed the patient's current medications.  Results for orders placed or performed during the hospital encounter of 11/28/21 (from the past 48 hour(s))  I-STAT, chem 8     Status: None   Collection Time: 11/28/21  7:42 AM  Result Value Ref Range   Sodium 142 135 - 145 mmol/L   Potassium 3.6 3.5 - 5.1 mmol/L   Chloride 102 98 - 111 mmol/L   BUN 22 8 - 23 mg/dL   Creatinine, Ser 0.70 0.44 - 1.00 mg/dL   Glucose, Bld 98 70 - 99 mg/dL    Comment: Glucose reference range applies only to samples taken after fasting for at least 8 hours.   Calcium, Ion 1.40 1.15 - 1.40 mmol/L   TCO2 26 22 - 32 mmol/L   Hemoglobin 12.6 12.0 - 15.0 g/dL   HCT 37.0 36.0 - 46.0 %     No results found.  ROS - all of the below systems have been reviewed with the patient and positives are indicated with bold text General: chills, fever or night sweats Eyes: blurry vision or double vision ENT: epistaxis or sore throat Allergy/Immunology: itchy/watery eyes or nasal congestion Hematologic/Lymphatic: bleeding problems, blood clots or swollen lymph nodes Endocrine: temperature intolerance or unexpected weight changes Breast: new or changing breast lumps or nipple discharge Resp: cough, shortness of breath, or wheezing CV: chest pain or dyspnea on exertion GI: as per HPI GU: dysuria, trouble voiding, or hematuria MSK: joint pain or joint stiffness Neuro: TIA or stroke symptoms Derm: pruritus and skin lesion changes Psych: anxiety and depression  PE Blood pressure (!) 156/71, pulse 85, temperature 97.7 F (36.5 C), temperature source Oral, resp. rate 16,  height 5' 2"  (1.575 m), weight 66.5 kg, SpO2 93 %. Constitutional: NAD; conversant Eyes: Moist conjunctiva; no lid lag; anicteric Neck: Trachea midline; no thyromegaly Lungs: Normal respiratory effort CV: RRR MSK: Normal range of motion of extremities Psychiatric: Appropriate affect; alert and oriented x3   Results for orders placed or performed during the hospital encounter of 11/28/21 (from the past 48 hour(s))  I-STAT, chem 8     Status: None   Collection Time: 11/28/21  7:42 AM  Result Value Ref Range   Sodium 142 135 - 145 mmol/L   Potassium 3.6 3.5 - 5.1 mmol/L   Chloride 102 98 - 111 mmol/L   BUN 22 8 - 23 mg/dL   Creatinine, Ser 0.70 0.44 - 1.00 mg/dL   Glucose, Bld 98 70 - 99 mg/dL    Comment: Glucose reference range applies only to samples taken after fasting for at least 8 hours.   Calcium, Ion 1.40 1.15 - 1.40 mmol/L   TCO2 26 22 - 32 mmol/L   Hemoglobin 12.6 12.0 - 15.0 g/dL   HCT 37.0 36.0 - 46.0 %    No results found.  A/P: Ashley Rivera is an 81 y.o. female with hx of CUC, COPD here  for evaluation of abnormal polypoid/ulcerated rectal mucosa found during anoscopy 07/2021. This appears to have also been seen during her colonoscopy last year.  -The anatomy and physiology of the anal canal was discussed with the patient with associated pictures. The pathophysiology of hemorrhoids and abnormal anal tissue was discussed at length  -We have reviewed options going forward including further observation vs surgery -hemorrhoidectomy, anorectal exam under anesthesia. 1 versus 2 column, based on intraoperative findings. -We discussed that with the appearance, we cannot be sure that there is not any abnormal type tissue involved in this. I suspect this is related to mucosal trauma from chronic prolapse. We discussed approximately 10 to 20% of hemorrhoidectomy cases that we do for these that have abnormal pathology such as AIN. She would like to proceed with surgery and I think this is reasonable. -Cardiac clearance being requested -The planned procedure, material risks (including, but not limited to, pain, bleeding, infection, scarring, need for blood transfusion, damage to anal sphincter, incontinence of gas and/or stool, need for additional procedures, recurrence, pneumonia, heart attack, stroke, death) benefits and alternatives to surgery were discussed at length. I noted a good probability that the procedure would help improve their symptoms. The patient's questions were answered to her satisfaction, she voiced understanding and elected to proceed with surgery. Additionally, we discussed typical postoperative expectations and the recovery process.  Nadeen Landau, [facility], [place]

## 2021-11-28 NOTE — Anesthesia Preprocedure Evaluation (Addendum)
Anesthesia Evaluation  Patient identified by MRN, date of birth, ID band Patient awake    Reviewed: Allergy & Precautions, NPO status , Patient's Chart, lab work & pertinent test results  Airway Mallampati: III  TM Distance: <3 FB Neck ROM: Full  Mouth opening: Limited Mouth Opening  Dental  (+) Dental Advisory Given   Pulmonary asthma , COPD, former smoker,    breath sounds clear to auscultation       Cardiovascular hypertension, Pt. on medications + CAD   Rhythm:Regular Rate:Normal     Neuro/Psych negative neurological ROS     GI/Hepatic Neg liver ROS, hiatal hernia, GERD  ,  Endo/Other  negative endocrine ROS  Renal/GU negative Renal ROS     Musculoskeletal   Abdominal   Peds  Hematology negative hematology ROS (+)   Anesthesia Other Findings   Reproductive/Obstetrics                            Anesthesia Physical Anesthesia Plan  ASA: 3  Anesthesia Plan: General   Post-op Pain Management: Tylenol PO (pre-op)* and Toradol IV (intra-op)*   Induction: Intravenous  PONV Risk Score and Plan: 3 and Dexamethasone, Ondansetron and Treatment may vary due to age or medical condition  Airway Management Planned: Oral ETT and Video Laryngoscope Planned  Additional Equipment: None  Intra-op Plan:   Post-operative Plan: Extubation in OR  Informed Consent: I have reviewed the patients History and Physical, chart, labs and discussed the procedure including the risks, benefits and alternatives for the proposed anesthesia with the patient or authorized representative who has indicated his/her understanding and acceptance.     Dental advisory given  Plan Discussed with: CRNA  Anesthesia Plan Comments:        Anesthesia Quick Evaluation

## 2021-11-28 NOTE — Op Note (Signed)
11/28/2021  9:21 AM  PATIENT:  Ashley Rivera  81 y.o. female  Patient Care Team: Michael Boston, MD as PCP - General (Internal Medicine) Werner Lean, MD as PCP - Cardiology (Cardiology)  PRE-OPERATIVE DIAGNOSIS: Anorectal lesion, hemorrhoids  POST-OPERATIVE DIAGNOSIS:  Same  PROCEDURE:   Hemorrhoidectomy Anorectal exam under anesthesia  SURGEON:  Surgeon(s): Ileana Roup, MD  ASSISTANT: OR staff   ANESTHESIA:   local and general  SPECIMEN: Anterior midline anorectal/hemorrhoidal tissue  DISPOSITION OF SPECIMEN:  PATHOLOGY  COUNTS:  Sponge, needle, and instrument counts were reported correct x2 at conclusion.  EBL: 10 mL  Drains: None  PLAN OF CARE: Discharge to home after PACU  PATIENT DISPOSITION:  PACU - hemodynamically stable.  OR FINDINGS: Somewhat patulous anal canal.  A limited hemorrhoidectomy was therefore carried out of the anterior midline hemorrhoidal tissue that has overlying abnormal appearing mucosa that is friable.  This may be chronic changes of prolapse versus adenomatous in appearance.  This was all fully excised and submitted for pathology.  No other significant hemorrhoidal tissue or evident anorectal pathology is identified.  DESCRIPTION: The patient was identified in the preoperative holding area and taken to the OR. SCDs were applied. She then underwent general endotracheal anesthesia without difficulty. The patient was then rolled onto the OR table in the prone jackknife position. Pressure points were then evaluated and padded. Benzoin was applied to the buttocks and they were gently taped apart.  She was then prepped and draped in usual sterile fashion.  A surgical timeout was performed indicating the correct patient, procedure, and positioning.  A perianal block was then created using a dilute mixture of 0.25% Marcaine with epinephrine and Exparel.  After ascertaining an appropriate level of anesthesia had been achieved, a well  lubricated digital rectal exam was performed. This demonstrated somewhat patulous anal canal.  No palpable masses.  A Hill-Ferguson anoscope was into the anal canal and circumferential inspection demonstrated anterior midline internal hemorrhoidal tissue with some abnormal appearing overlying mucosa.  These may be related to changes of chronic prolapse versus actually adenomatous.  Regardless, we proceed with the planned excision of this hemorrhoidal tissue.  With the anoscope in place, the internal hemorrhoidal tissue was elevated with a DeBakey forcep.  Margins of excision are marked electrocautery.  This tissue was then excised sharply and dissection completed with electrocautery, teasing this tissue away from the sphincter complex.  No muscle is divided.  This is passed off as specimen.  Hemostasis is achieved electrocautery.  The hemorrhoidal defect is then closed using a running, locking 2-0 Vicryl suture.  The anal canal was inspected.  Additional local anesthetic is infiltrated.  The anal canal was irrigated and hemostasis verified.  A piece of Surgifoam is placed in the anal canal to assist in maintenance of hemostasis.  All sponge, needle, and instrument counts are reported correct.  The buttocks are untaped.  A dressing consisting of 4 x 4's, ABD, and mesh underwear was placed.  She is rolled back onto a stretcher, wake from anesthesia, extubated, and transferred to the recovery room in satisfactory condition.   DISPOSITION: PACU in satisfactory condition.

## 2021-11-28 NOTE — Anesthesia Procedure Notes (Signed)
Procedure Name: Intubation Date/Time: 11/28/2021 8:39 AM  Performed by: Bonney Aid, CRNAPre-anesthesia Checklist: Patient identified, Emergency Drugs available, Suction available and Patient being monitored Patient Re-evaluated:Patient Re-evaluated prior to induction Oxygen Delivery Method: Circle system utilized Preoxygenation: Pre-oxygenation with 100% oxygen Induction Type: IV induction Ventilation: Mask ventilation without difficulty Laryngoscope Size: Glidescope and 3 Grade View: Grade I Tube type: Parker flex tip Tube size: 7.0 mm Number of attempts: 1 Airway Equipment and Method: Stylet Placement Confirmation: ETT inserted through vocal cords under direct vision, positive ETCO2 and breath sounds checked- equal and bilateral Secured at: 21 cm Tube secured with: Tape Dental Injury: Teeth and Oropharynx as per pre-operative assessment  Difficulty Due To: Difficult Airway- due to limited oral opening, Difficult Airway- due to reduced neck mobility and Difficulty was anticipated

## 2021-11-28 NOTE — Discharge Instructions (Addendum)
ANORECTAL SURGERY: POST OP INSTRUCTIONS  DIET: Follow a light bland diet the first 24 hours after arrival home, such as soup, liquids, crackers, etc.  Be sure to include lots of fluids daily.  Avoid fast food or heavy meals as your are more likely to get nauseated.  Eat a low fat diet the next few days after surgery.   Some bleeding with bowel movements is expected for the first couple of days but this should stop in between bowel movements. A piece of foam ('surgifoam') was intentionally placed in the anal canal at completion of the procedure. This may appear as bloody jelly when it passes and is normal. It is ok to flush this down the toilet  Take your usually prescribed home medications unless otherwise directed. No foreign bodies per rectum for the next 3 months (enemas, etc)  PAIN CONTROL: It is helpful to take an over-the-counter pain medication regularly for the first few days/weeks.  Choose from the following that works best for you: Ibuprofen (Advil, etc) Three 251m tabs every 6 hours as needed. Acetaminophen (Tylenol, etc) 500-6522mevery 6 hours as needed NOTE: You may take both of these medications together - most patients find it most helpful when alternating between the two (i.e. Ibuprofen at 6am, tylenol at 9am, ibuprofen at 12pm ...)Marland KitchenA  prescription for pain medication may have been prescribed for you at discharge.  Take your pain medication as prescribed.  If you are having problems/concerns with the prescription medicine, please call usKoreaor further advice.  Avoid getting constipated.  Between the surgery and the pain medications, it is common to experience some constipation.  Increasing fluid intake (64oz of water per day) and taking a fiber supplement (such as Metamucil, Citrucel, FiberCon) 1-2 times a day regularly will usually help prevent this problem from occurring.  Take Miralax (over the counter) 1-2x/day while taking a narcotic pain medication. If no bowel movement after  48hours, you may additionally take a laxative like a bottle of Milk of Magnesia which can be purchased over the counter. Avoid enemas.   Watch out for diarrhea.  If you have many loose bowel movements, simplify your diet to bland foods.  Stop any stool softeners and decrease your fiber supplement. If this worsens or does not improve, please call usKorea Wash / shower every day.  If you were discharged with a dressing, you may remove this the day after your surgery. You may shower normally, getting soap/water on your wound, particularly after bowel movements.  Soaking in a warm bath filled a couple inches ("Sitz bath") is a great way to clean the area after a bowel movement and many patients find it is a way to soothe the area.  ACTIVITIES as tolerated:   You may resume regular (light) daily activities beginning the next day--such as daily self-care, walking, climbing stairs--gradually increasing activities as tolerated.  If you can walk 30 minutes without difficulty, it is safe to try more intense activity such as jogging, treadmill, bicycling, low-impact aerobics, etc. Refrain from any heavy lifting or straining for the first 2 weeks after your procedure, particularly if your surgery was for hemorrhoids. Avoid activities that make your pain worse You may drive when you are no longer taking prescription pain medication, you can comfortably wear a seatbelt, and you can safely maneuver your car and apply brakes.  FOLLOW UP in our office Please call [facility] at [phone] to set up an appointment to see your surgeon in the office for  a follow-up appointment approximately 2 weeks after your surgery. Make sure that you call for this appointment the day you arrive home to insure a convenient appointment time.  9. If you have disability or family leave forms that need to be completed, you may have them completed by your primary care physician's office; for return to work instructions, please ask our office  staff and they will be happy to assist you in obtaining this documentation   When to call us [phone]: Poor pain control Reactions / problems with new medications (rash/itching, etc)  Fever over 101.5 F (38.5 C) Inability to urinate Nausea/vomiting Worsening swelling or bruising Continued bleeding from incision. Increased pain, redness, or drainage from the incision  The clinic staff is available to answer your questions during regular business hours (8:30am-5pm).  Please don't hesitate to call and ask to speak to one of our nurses for clinical concerns.   A surgeon from [facility] is always on call at the hospitals   If you have a medical emergency, go to the nearest emergency room or call 911.   [facility] [address] [place], [place], [place]  [address] MAIN: [phone] FAX: [phone] [email or web address]    Post Anesthesia Home Care Instructions  Activity: Get plenty of rest for the remainder of the day. A responsible individual must stay with you for 24 hours following the procedure.  For the next 24 hours, DO NOT: -Drive a car -Paediatric nurse -Drink alcoholic beverages -Take any medication unless instructed by your physician -Make any legal decisions or sign important papers.  Meals: Start with liquid foods such as gelatin or soup. Progress to regular foods as tolerated. Avoid greasy, spicy, heavy foods. If nausea and/or vomiting occur, drink only clear liquids until the nausea and/or vomiting subsides. Call your physician if vomiting continues.  Special Instructions/Symptoms: Your throat may feel dry or sore from the anesthesia or the breathing tube placed in your throat during surgery. If this causes discomfort, gargle with warm salt water. The discomfort should disappear within 24 hours.  Do not take any Tylenol until after 1:30 pm today if needed.D      Information for  Discharge Teaching: EXPAREL (bupivacaine liposome injectable suspension)   Your surgeon or anesthesiologist gave you EXPAREL(bupivacaine) to help control your pain after surgery.  EXPAREL is a local anesthetic that provides pain relief by numbing the tissue around the surgical site. EXPAREL is designed to release pain medication over time and can control pain for up to 72 hours. Depending on how you respond to EXPAREL, you may require less pain medication during your recovery.  Possible side effects: Temporary loss of sensation or ability to move in the area where bupivacaine was injected. Nausea, vomiting, constipation Rarely, numbness and tingling in your mouth or lips, lightheadedness, or anxiety may occur. Call your doctor right away if you think you may be experiencing any of these sensations, or if you have other questions regarding possible side effects.  Follow all other discharge instructions given to you by your surgeon or nurse. Eat a healthy diet and drink plenty of water or other fluids.  If you return to the hospital for any reason within 96 hours following the administration of EXPAREL, it is important for health care providers to know that you have received this anesthetic. A teal colored band has been placed on your arm with the date, time and amount of EXPAREL you have received in order  to alert and inform your health care providers. Please leave this armband in place for the full 96 hours following administration, and then you may remove the band.

## 2021-11-28 NOTE — Transfer of Care (Signed)
Immediate Anesthesia Transfer of Care Note  Patient: Ashley Rivera  Procedure(s) Performed: Jasmine December UNDER ANESTHESIA WITH HEMORRHOIDECTOMY 1 VS 2 COLUMN  Patient Location: PACU  Anesthesia Type:General  Level of Consciousness: sedated  Airway & Oxygen Therapy: Patient Spontanous Breathing and Patient connected to nasal cannula oxygen  Post-op Assessment: Report given to RN  Post vital signs: Reviewed and stable  Last Vitals:  Vitals Value Taken Time  BP 137/58 11/28/21 0925  Temp 36.3 C 11/28/21 0925  Pulse 77 11/28/21 0930  Resp 18 11/28/21 0930  SpO2 100 % 11/28/21 0930  Vitals shown include unvalidated device data.  Last Pain:  Vitals:   11/28/21 0700  TempSrc: Oral         Complications:  Encounter Notable Events  Notable Event Outcome Phase Comment  Difficult to intubate - expected  Intraprocedure Filed from anesthesia note documentation.

## 2021-11-29 ENCOUNTER — Encounter (HOSPITAL_BASED_OUTPATIENT_CLINIC_OR_DEPARTMENT_OTHER): Payer: Self-pay | Admitting: Surgery

## 2021-11-29 LAB — SURGICAL PATHOLOGY

## 2021-12-19 MED ORDER — SULFASALAZINE 500 MG PO TBEC: 1000.0000 mg | DELAYED_RELEASE_TABLET | Freq: Two times a day (BID) | ORAL | 2 refills | Status: DC

## 2021-12-21 DIAGNOSIS — H16232 Neurotrophic keratoconjunctivitis, left eye: Secondary | ICD-10-CM | POA: Diagnosis not present

## 2021-12-21 DIAGNOSIS — Z961 Presence of intraocular lens: Secondary | ICD-10-CM | POA: Diagnosis not present

## 2021-12-21 DIAGNOSIS — H52203 Unspecified astigmatism, bilateral: Secondary | ICD-10-CM | POA: Diagnosis not present

## 2021-12-27 DIAGNOSIS — N200 Calculus of kidney: Secondary | ICD-10-CM | POA: Diagnosis not present

## 2021-12-27 DIAGNOSIS — N281 Cyst of kidney, acquired: Secondary | ICD-10-CM | POA: Diagnosis not present

## 2022-01-23 DIAGNOSIS — I10 Essential (primary) hypertension: Secondary | ICD-10-CM | POA: Diagnosis not present

## 2022-01-23 DIAGNOSIS — K645 Perianal venous thrombosis: Secondary | ICD-10-CM | POA: Diagnosis not present

## 2022-02-06 DIAGNOSIS — M79644 Pain in right finger(s): Secondary | ICD-10-CM | POA: Diagnosis not present

## 2022-02-06 DIAGNOSIS — L03011 Cellulitis of right finger: Secondary | ICD-10-CM | POA: Diagnosis not present

## 2022-02-06 DIAGNOSIS — I10 Essential (primary) hypertension: Secondary | ICD-10-CM | POA: Diagnosis not present

## 2022-02-27 DIAGNOSIS — Z780 Asymptomatic menopausal state: Secondary | ICD-10-CM | POA: Diagnosis not present

## 2022-02-27 DIAGNOSIS — M810 Age-related osteoporosis without current pathological fracture: Secondary | ICD-10-CM | POA: Diagnosis not present

## 2022-02-27 DIAGNOSIS — Z1231 Encounter for screening mammogram for malignant neoplasm of breast: Secondary | ICD-10-CM | POA: Diagnosis not present

## 2022-02-27 DIAGNOSIS — M85852 Other specified disorders of bone density and structure, left thigh: Secondary | ICD-10-CM | POA: Diagnosis not present

## 2022-02-28 ENCOUNTER — Encounter: Payer: Self-pay | Admitting: Obstetrics and Gynecology

## 2022-02-28 ENCOUNTER — Ambulatory Visit: Payer: PPO | Admitting: Obstetrics and Gynecology

## 2022-02-28 DIAGNOSIS — L309 Dermatitis, unspecified: Secondary | ICD-10-CM

## 2022-02-28 DIAGNOSIS — R350 Frequency of micturition: Secondary | ICD-10-CM

## 2022-02-28 DIAGNOSIS — N760 Acute vaginitis: Secondary | ICD-10-CM | POA: Diagnosis not present

## 2022-02-28 DIAGNOSIS — N952 Postmenopausal atrophic vaginitis: Secondary | ICD-10-CM | POA: Diagnosis not present

## 2022-02-28 DIAGNOSIS — Z87440 Personal history of urinary (tract) infections: Secondary | ICD-10-CM | POA: Diagnosis not present

## 2022-02-28 DIAGNOSIS — R109 Unspecified abdominal pain: Secondary | ICD-10-CM | POA: Diagnosis not present

## 2022-02-28 LAB — URINALYSIS, COMPLETE
Bilirubin Urine: NEGATIVE
Glucose, UA: NEGATIVE
Hyaline Cast: NONE SEEN /LPF
Ketones, ur: NEGATIVE
Nitrite: NEGATIVE
Protein, ur: NEGATIVE
Specific Gravity, Urine: 1.02 (ref 1.001–1.035)
pH: 5.5 (ref 5.0–8.0)

## 2022-02-28 LAB — WET PREP FOR TRICH, YEAST, CLUE

## 2022-02-28 MED ORDER — CLOBETASOL PROPIONATE 0.05 % EX OINT: 1.0000 | TOPICAL_OINTMENT | Freq: Two times a day (BID) | CUTANEOUS | 0 refills | Status: DC

## 2022-02-28 NOTE — Progress Notes (Signed)
81 y.o. G45P2002 Married White or Caucasian Not Hispanic or Latino female here for frequent UTI, urinary incontinence, and vaginal discharge.    She thinks she has a UTI now. Currently having lower back pain and SP discomfort. She has some frequency of urination. Not having typical urgency or worsening of her incontinence. She is noticing a light yellow d/c on her panty liners. She thought it was urine and now thinks it may be a vaginitis. She is having vulvar irritation for at least 3 weeks, painful to wipe.   Typically has GSI, doesn't typically leak on the way to the bathroom. With her last UTI she had urge incontinence.  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   No h/o breast cancer. No h/o blood clots.     No LMP recorded. Patient is postmenopausal.          Sexually active: No.  The current method of family planning is post menopausal status.    Exercising: No.  The patient does not participate in regular exercise at present. Smoker:  no  Health Maintenance: Pap:  09/18/2017 WNL Hr HPV neg  History of abnormal Pap:  no MMG:  She had her mammo yesterday  BMD:   was done yesterday at [facility]  Colonoscopy: 07/03/20 no f/u needed  TDaP:  2011 Gardasil: n/a   reports that she quit smoking about 14 years ago. Her smoking use included cigarettes. She has never used smokeless tobacco. She reports current alcohol use of about 2.0 standard drinks of alcohol per week. She reports that she does not use drugs.  Past Medical History:  Diagnosis Date   Allergy    Asthma    bronchial asthma   Cataract    left eye   Complication of anesthesia    Became congested after colonoscopy   COPD (chronic obstructive pulmonary disease) (HCC)    Diverticulosis    Emphysema of lung (HCC)    Genital warts    Dx about 30 years ago    GERD (gastroesophageal reflux disease)    Hemorrhoids    History of hiatal hernia    History of kidney stones    Hx of colonic polyp    Hx: UTI (urinary tract  infection)    Hypertension    Osteoporosis    Pneumonia    Proctitis 11/12/2011   Skin cancer    basal cell   Urinary incontinence    On occasion    Past Surgical History:  Procedure Laterality Date   BIOPSY  07/03/2020   Procedure: BIOPSY;  Surgeon: Mauri Pole, MD;  [facility];  Service: Endoscopy;;   COLONOSCOPY     COLONOSCOPY WITH PROPOFOL N/A 10/28/2019   Procedure: COLONOSCOPY WITH PROPOFOL  LARGE CECAL POLYP;  Surgeon: Mauri Pole, MD;  [facility];  Service: Endoscopy;  Laterality: N/A;   COLONOSCOPY WITH PROPOFOL N/A 07/03/2020   Procedure: COLONOSCOPY WITH PROPOFOL;  Surgeon: Mauri Pole, MD;  [facility];  Service: Endoscopy;  Laterality: N/A;   CYSTOSCOPY/URETEROSCOPY/HOLMIUM LASER/STENT PLACEMENT Right 11/09/2019   Procedure: CYSTOSCOPY/URETEROSCOPY/HOLMIUM LASER/STENT PLACEMENT;  Surgeon: Festus Aloe, MD;  [facility]: [facility];  Service: Urology;  Laterality: Right;   DILATION AND CURETTAGE OF UTERUS     50 years ago. Removed rest of placenta from birth   [place] N/A 10/28/2019   Procedure: ENDOSCOPIC MUCOSAL RESECTION;  Surgeon: Mauri Pole, MD;  [facility];  Service: Endoscopy;  Laterality: N/A;   EVALUATION  UNDER ANESTHESIA WITH HEMORRHOIDECTOMY N/A 11/28/2021   Procedure: EXAM UNDER ANESTHESIA WITH HEMORRHOIDECTOMY 1 VS 2 COLUMN;  Surgeon: Ileana Roup, MD;  Location: [place];  Service: General;  Laterality: N/A;   EYE SURGERY     cataract   HEMOSTASIS CLIP PLACEMENT  10/28/2019   Procedure: HEMOSTASIS CLIP PLACEMENT;  Surgeon: Mauri Pole, MD;  [facility];  Service: Endoscopy;;   INNER EAR SURGERY Right 2006   Tumor removed    POLYPECTOMY  10/28/2019   Procedure: POLYPECTOMY;  Surgeon: Mauri Pole, MD;  [facility];  Service: Endoscopy;;   POLYPECTOMY  07/03/2020   Procedure: POLYPECTOMY;  Surgeon:  Mauri Pole, MD;  [facility];  Service: Endoscopy;;   SUBMUCOSAL LIFTING INJECTION  10/28/2019   Procedure: SUBMUCOSAL LIFTING INJECTION;  Surgeon: Mauri Pole, MD;  [facility];  Service: Endoscopy;;   SUBMUCOSAL TATTOO INJECTION  10/28/2019   Procedure: SUBMUCOSAL TATTOO INJECTION;  Surgeon: Mauri Pole, MD;  [facility];  Service: Endoscopy;;   TONSILLECTOMY AND ADENOIDECTOMY  1950    Current Outpatient Medications  Medication Sig Dispense Refill   B Complex Vitamins (VITAMIN-B COMPLEX PO) Take 1 tablet by mouth every evening.     budesonide-formoterol (SYMBICORT) 160-4.5 MCG/ACT inhaler Inhale 2 puffs into the lungs daily as needed (shortness of breath).      calcium carbonate (TUMS - DOSED IN MG ELEMENTAL CALCIUM) 500 MG chewable tablet Chew 1 tablet by mouth as needed for indigestion or heartburn.     carboxymethylcellulose (REFRESH PLUS) 0.5 % SOLN Place 2 drops into both eyes 2 (two) times daily as needed (dry/irritated eyes).     cetirizine (ZYRTEC) 10 MG tablet Take 10 mg by mouth daily as needed for allergies.      chlorthalidone (HYGROTON) 25 MG tablet Take 25 mg by mouth every other day.     Cholecalciferol (VITAMIN D3) 50 MCG (2000 UT) TABS Take 2,000 Units by mouth daily.     diltiazem (CARDIZEM CD) 120 MG 24 hr capsule Take 1 capsule (120 mg total) by mouth daily. 90 capsule 3   ezetimibe (ZETIA) 10 MG tablet Take 10 mg by mouth daily.     Ferrous Sulfate (IRON) 142 (45 Fe) MG TBCR Take 1 tablet by mouth daily.     fluorouracil (EFUDEX) 5 % cream Apply topically as needed. Pre cancer skin care     hydrocortisone 2.5 % cream as needed.     MELATONIN PO Take 8 mg by mouth at bedtime.     Omega-3 Fatty Acids (FISH OIL ULTRA) 1400 MG CAPS Take 1,400 mg by mouth daily.     pantoprazole (PROTONIX) 20 MG tablet Take 20 mg by mouth as needed for heartburn or indigestion.     Probiotic Product (ALIGN) 4 MG CAPS Take 4 mg by mouth  daily.     sulfaSALAzine (AZULFIDINE) 500 MG EC tablet Take 2 tablets (1,000 mg total) by mouth 2 (two) times daily. 120 tablet 2   tiotropium (SPIRIVA) 18 MCG inhalation capsule Place 18 mcg into inhaler and inhale daily as needed (asthma).      VENTOLIN HFA 108 (90 BASE) MCG/ACT inhaler Inhale 1 puff into the lungs as needed for wheezing or shortness of breath.   2   vitamin C (ASCORBIC ACID) 500 MG tablet Take 500 mg by mouth daily.     Wheat Dextrin (BENEFIBER PO) Take 1 Dose by mouth daily.     No  current facility-administered medications for this visit.    Family History  Problem Relation Age of Onset   Lung cancer Mother    Colon cancer Paternal Grandmother    Breast cancer Maternal Aunt    Esophageal cancer Neg Hx    Rectal cancer Neg Hx    Stomach cancer Neg Hx    Pancreatic cancer Neg Hx     Review of Systems  Genitourinary:  Positive for urgency.    Exam:   There were no vitals taken for this visit.  Weight change: @WEIGHTCHANGE @ Height:      Ht Readings from Last 3 Encounters:  11/28/21 5' 2"  (1.575 m)  11/26/21 5' 1"  (1.549 m)  08/02/21 5' 2"  (1.575 m)    General appearance: alert, cooperative and appears stated age   Pelvic: External genitalia:  no lesions, vulva is erythematous and swollen              Urethra:  normal appearing urethra with no masses, tenderness or lesions              Bartholins and Skenes: normal                 Vagina: atrophic appearing vagina with normal color and discharge, no lesions              Cervix: no lesions               Anus:  anal tag noted at 11 o'clock, perianal erythema and fissure in the skin between her buttocks  Gae Dry, CMA chaperoned for the exam.  1. Urinary frequency - Urinalysis, Complete: not very concerning, will send a culture - Urine Culture  2. Acute vaginitis Discussed vulvar skin care, information given -She isn't sure if the wetness on her pad if from her bladder or her vagina. She will try  over the counter AZO to help her determine the source - WET PREP FOR TRICH, YEAST, CLUE: negative - SureSwab Advanced Vaginitis, TMA - clobetasol ointment (TEMOVATE) 0.05 %; Apply 1 Application topically 2 (two) times daily. Apply as directed twice daily for up to 2 weeks  Dispense: 60 g; Refill: 0  3. Perianal dermatitis - clobetasol ointment (TEMOVATE) 0.05 %; Apply 1 Application topically 2 (two) times daily. Apply as directed twice daily for up to 2 weeks  Dispense: 60 g; Refill: 0  4. Vaginal atrophy  5. History of recurrent UTI (urinary tract infection) On review of her chart the last 2 times she was treated for a UTI her urine grew out multiple species and recollection was recommended. -We did discuss that sometimes we use vaginal estrogen to try and prevent recurrent UTI's (no contraindications).

## 2022-03-01 LAB — SURESWAB® ADVANCED VAGINITIS,TMA
CANDIDA SPECIES: DETECTED — AB
Candida glabrata: NOT DETECTED
SURESWAB(R) ADV BACTERIAL VAGINOSIS(BV),TMA: NEGATIVE
TRICHOMONAS VAGINALIS (TV),TMA: NOT DETECTED

## 2022-03-03 LAB — URINE CULTURE
MICRO NUMBER:: 14105413
SPECIMEN QUALITY:: ADEQUATE

## 2022-03-06 ENCOUNTER — Other Ambulatory Visit: Payer: Self-pay | Admitting: *Deleted

## 2022-03-06 MED ORDER — FLUCONAZOLE 150 MG PO TABS: ORAL_TABLET | ORAL | 0 refills | Status: DC

## 2022-03-06 MED ORDER — NITROFURANTOIN MONOHYD MACRO 100 MG PO CAPS: 100.0000 mg | ORAL_CAPSULE | Freq: Two times a day (BID) | ORAL | 0 refills | Status: DC

## 2022-03-11 ENCOUNTER — Ambulatory Visit: Payer: PPO | Admitting: Obstetrics and Gynecology

## 2022-03-12 ENCOUNTER — Encounter: Payer: Self-pay | Admitting: Obstetrics and Gynecology

## 2022-03-12 ENCOUNTER — Ambulatory Visit: Payer: PPO | Admitting: Obstetrics and Gynecology

## 2022-03-12 DIAGNOSIS — R3989 Other symptoms and signs involving the genitourinary system: Secondary | ICD-10-CM | POA: Diagnosis not present

## 2022-03-12 DIAGNOSIS — N9089 Other specified noninflammatory disorders of vulva and perineum: Secondary | ICD-10-CM | POA: Diagnosis not present

## 2022-03-12 DIAGNOSIS — B3731 Acute candidiasis of vulva and vagina: Secondary | ICD-10-CM | POA: Diagnosis not present

## 2022-03-12 LAB — URINALYSIS, COMPLETE
Bacteria, UA: NONE SEEN /HPF
Bilirubin Urine: NEGATIVE
Glucose, UA: NEGATIVE
Leukocytes,Ua: NEGATIVE
Nitrite: NEGATIVE
Specific Gravity, Urine: 1.02 (ref 1.001–1.035)
WBC, UA: NONE SEEN /HPF (ref 0–5)
pH: 5.5 (ref 5.0–8.0)

## 2022-03-12 LAB — WET PREP FOR TRICH, YEAST, CLUE

## 2022-03-12 MED ORDER — FLUCONAZOLE 150 MG PO TABS: ORAL_TABLET | ORAL | 0 refills | Status: DC

## 2022-03-12 NOTE — Progress Notes (Signed)
GYNECOLOGY  VISIT   HPI: 81 y.o.   Married White or Caucasian Not Hispanic or Latino  female   [phone] with No LMP recorded. Patient is postmenopausal.   here for  f/u of vulvovaginitis. She was seen on 02/28/22 with vulvar irritation, lower back pain and SP discomfort. She was treated for a yeast infection and UTI. Here for f/u of her severe vulvar irritation.   She states that she has a pea size knot on her vulva. She is also still having some bladder and back pain. Her bladder symptoms improved with antibiotics but hasn't resolved. Urgency and incontinence is better.   Her vagina feels fine. She still has some vulvar irritation, burning. She has a non tender vulva on the left side.   GYNECOLOGIC HISTORY: No LMP recorded. Patient is postmenopausal. Contraception:pmp Menopausal hormone therapy: none         OB History     Gravida  2   Para  2   Term  2   Preterm      AB      Living  2      SAB      IAB      Ectopic      Multiple      Live Births  2              Patient Active Problem List   Diagnosis Date Noted   PAC (premature atrial contraction) 11/26/2021   Paroxysmal SVT (supraventricular tachycardia) 11/26/2021   Frequent PVCs 06/20/2021   Unspecified open wound, right lower leg, initial encounter 04/23/2021   Hardening of the aorta (main artery of the heart) ([place]) 04/23/2021   Essential hypertension 04/23/2021   Anemia 04/23/2021   Meningioma ([place]) 07/10/2020   Polyp of rectum    Chronic obstructive pulmonary disease ([place]) 01/28/2020   Coronary artery calcification 01/28/2020   SOB (shortness of breath) 01/28/2020   Polyp of cecum    Non-thrombocytopenic purpura ([place]) 11/11/2015   Microscopic hematuria 10/26/2015   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   Diaphragmatic hernia 03/03/2015   GERD (gastroesophageal reflux disease) 01/17/2015   Vitamin D deficiency 10/31/2014   Peripheral venous insufficiency 09/16/2012   Pruritus ani 12/04/2011    Proctitis 12/04/2011   Personal history of colonic polyps 10/22/2011   Internal hemorrhoids without mention of complication 56/21/3086   Hyperlipidemia 02/04/2009   Age-related osteoporosis without current pathological fracture 02/04/2009    Past Medical History:  Diagnosis Date   Allergy    Asthma    bronchial asthma   Cataract    left eye   Complication of anesthesia    Became congested after colonoscopy   COPD (chronic obstructive pulmonary disease) ([place])    Diverticulosis    Emphysema of lung ([place])    Genital warts    Dx about 30 years ago    GERD (gastroesophageal reflux disease)    Hemorrhoids    History of hiatal hernia    History of kidney stones    Hx of colonic polyp    Hx: UTI (urinary tract infection)    Hypertension    Osteoporosis    Pneumonia    Proctitis 11/12/2011   Skin cancer    basal cell   Urinary incontinence    On occasion    Past Surgical History:  Procedure Laterality Date   BIOPSY  07/03/2020   Procedure: BIOPSY;  Surgeon: Mauri Pole, MD;  [facility];  Service: Endoscopy;;   COLONOSCOPY  COLONOSCOPY WITH PROPOFOL N/A 10/28/2019   Procedure: COLONOSCOPY WITH PROPOFOL  LARGE CECAL POLYP;  Surgeon: Mauri Pole, MD;  [facility];  Service: Endoscopy;  Laterality: N/A;   COLONOSCOPY WITH PROPOFOL N/A 07/03/2020   Procedure: COLONOSCOPY WITH PROPOFOL;  Surgeon: Mauri Pole, MD;  [facility];  Service: Endoscopy;  Laterality: N/A;   CYSTOSCOPY/URETEROSCOPY/HOLMIUM LASER/STENT PLACEMENT Right 11/09/2019   Procedure: CYSTOSCOPY/URETEROSCOPY/HOLMIUM LASER/STENT PLACEMENT;  Surgeon: Festus Aloe, MD;  [facility];  Service: Urology;  Laterality: Right;   DILATION AND CURETTAGE OF UTERUS     50 years ago. Removed rest of placenta from birth   [place] N/A 10/28/2019   Procedure: ENDOSCOPIC MUCOSAL RESECTION;  Surgeon: Mauri Pole, MD;  [facility];   Service: Endoscopy;  Laterality: N/A;   EVALUATION UNDER ANESTHESIA WITH HEMORRHOIDECTOMY N/A 11/28/2021   Procedure: EXAM UNDER ANESTHESIA WITH HEMORRHOIDECTOMY 1 VS 2 COLUMN;  Surgeon: Ileana Roup, MD;  Location: [place];  Service: General;  Laterality: N/A;   EYE SURGERY     cataract   HEMOSTASIS CLIP PLACEMENT  10/28/2019   Procedure: HEMOSTASIS CLIP PLACEMENT;  Surgeon: Mauri Pole, MD;  [facility];  Service: Endoscopy;;   INNER EAR SURGERY Right 2006   Tumor removed    POLYPECTOMY  10/28/2019   Procedure: POLYPECTOMY;  Surgeon: Mauri Pole, MD;  [facility];  Service: Endoscopy;;   POLYPECTOMY  07/03/2020   Procedure: POLYPECTOMY;  Surgeon: Mauri Pole, MD;  [facility];  Service: Endoscopy;;   SUBMUCOSAL LIFTING INJECTION  10/28/2019   Procedure: SUBMUCOSAL LIFTING INJECTION;  Surgeon: Mauri Pole, MD;  [facility];  Service: Endoscopy;;   SUBMUCOSAL TATTOO INJECTION  10/28/2019   Procedure: SUBMUCOSAL TATTOO INJECTION;  Surgeon: Mauri Pole, MD;  [facility];  Service: Endoscopy;;   TONSILLECTOMY AND ADENOIDECTOMY  1950    Current Outpatient Medications  Medication Sig Dispense Refill   B Complex Vitamins (VITAMIN-B COMPLEX PO) Take 1 tablet by mouth every evening.     budesonide-formoterol (SYMBICORT) 160-4.5 MCG/ACT inhaler Inhale 2 puffs into the lungs daily as needed (shortness of breath).      calcium carbonate (TUMS - DOSED IN MG ELEMENTAL CALCIUM) 500 MG chewable tablet Chew 1 tablet by mouth as needed for indigestion or heartburn.     carboxymethylcellulose (REFRESH PLUS) 0.5 % SOLN Place 2 drops into both eyes 2 (two) times daily as needed (dry/irritated eyes).     cetirizine (ZYRTEC) 10 MG tablet Take 10 mg by mouth daily as needed for allergies.      chlorthalidone (HYGROTON) 25 MG tablet Take 25 mg by mouth every other day.     Cholecalciferol (VITAMIN  D3) 50 MCG (2000 UT) TABS Take 2,000 Units by mouth daily.     clobetasol ointment (TEMOVATE) 7.65 % Apply 1 Application topically 2 (two) times daily. Apply as directed twice daily for up to 2 weeks 60 g 0   diltiazem (CARDIZEM CD) 120 MG 24 hr capsule Take 1 capsule (120 mg total) by mouth daily. 90 capsule 3   ezetimibe (ZETIA) 10 MG tablet Take 10 mg by mouth daily.     Ferrous Sulfate (IRON) 142 (45 Fe) MG TBCR Take 1 tablet by mouth daily.     fluconazole (DIFLUCAN) 150 MG tablet Take one tablet by mouth now and repeat in 72 hour if still symptomatic. 2 tablet 0   fluorouracil (EFUDEX) 5 % cream Apply  topically as needed. Pre cancer skin care     hydrocortisone 2.5 % cream as needed.     MELATONIN PO Take 8 mg by mouth at bedtime.     nitrofurantoin, macrocrystal-monohydrate, (MACROBID) 100 MG capsule Take 1 capsule (100 mg total) by mouth 2 (two) times daily. 10 capsule 0   Omega-3 Fatty Acids (FISH OIL ULTRA) 1400 MG CAPS Take 1,400 mg by mouth daily.     pantoprazole (PROTONIX) 20 MG tablet Take 20 mg by mouth as needed for heartburn or indigestion.     Probiotic Product (ALIGN) 4 MG CAPS Take 4 mg by mouth daily.     sulfaSALAzine (AZULFIDINE) 500 MG EC tablet Take 2 tablets (1,000 mg total) by mouth 2 (two) times daily. 120 tablet 2   tiotropium (SPIRIVA) 18 MCG inhalation capsule Place 18 mcg into inhaler and inhale daily as needed (asthma).      VENTOLIN HFA 108 (90 BASE) MCG/ACT inhaler Inhale 1 puff into the lungs as needed for wheezing or shortness of breath.   2   vitamin C (ASCORBIC ACID) 500 MG tablet Take 500 mg by mouth daily.     Wheat Dextrin (BENEFIBER PO) Take 1 Dose by mouth daily.     No current facility-administered medications for this visit.     ALLERGIES: Cephalexin  Family History  Problem Relation Age of Onset   Lung cancer Mother    Colon cancer Paternal Grandmother    Breast cancer Maternal Aunt    Esophageal cancer Neg Hx    Rectal cancer Neg Hx     Stomach cancer Neg Hx    Pancreatic cancer Neg Hx     Social History   Socioeconomic History   Marital status: Married    Spouse name: Not on file   Number of children: 2   Years of education: Not on file   Highest education level: Not on file  Occupational History   Occupation: Retired   Tobacco Use   Smoking status: Former    Years: 30.00    Types: Cigarettes    Quit date: 10/05/2007    Years since quitting: 14.4   Smokeless tobacco: Never  Vaping Use   Vaping Use: Never used  Substance and Sexual Activity   Alcohol use: Yes    Alcohol/week: 2.0 standard drinks of alcohol    Types: 2 Glasses of wine per week    Comment: once a week   Drug use: No   Sexual activity: Not Currently    Partners: Male    Birth control/protection: Post-menopausal  Other Topics Concern   Not on file  Social History Narrative   Daily caffeine    Social Determinants of Health   Financial Resource Strain: Not on file  Food Insecurity: Not on file  Transportation Needs: Not on file  Physical Activity: Not on file  Stress: Not on file  Social Connections: Not on file  Intimate Partner Violence: Not on file    Review of Systems  All other systems reviewed and are negative.   PHYSICAL EXAMINATION:    There were no vitals taken for this visit.    General appearance: alert, cooperative and appears stated age Abdomen: soft, non-tender; non distended, no masses,  no organomegaly CVA: not tender  Pelvic: External genitalia:  no lesions, she has a small epidermoid cyst on each labia majora, no surrounding erythema, not tender. Prior erythema and swelling has resolved  Urethra:  normal appearing urethra with no masses, tenderness or lesions              Bartholins and Skenes: normal                 Vagina: atrophic appearing vagina with normal color and discharge, no lesions              Cervix: no lesions               Chaperone was present for exam.  1. Bladder  pain Recently treated for MRSA UTI, still with mild symptoms - Urinalysis, Complete - Urine Culture  2. Vulvar irritation Recently treated for yeast, still with mild symptoms - WET PREP FOR TRICH, YEAST, CLUE  3. Yeast vaginitis - fluconazole (DIFLUCAN) 150 MG tablet; Take one tablet by mouth now and repeat in 72 hour if still symptomatic.  Dispense: 2 tablet; Refill: 0

## 2022-03-13 LAB — URINE CULTURE
MICRO NUMBER:: 14154903
Result:: NO GROWTH
SPECIMEN QUALITY:: ADEQUATE

## 2022-04-04 DIAGNOSIS — I872 Venous insufficiency (chronic) (peripheral): Secondary | ICD-10-CM | POA: Diagnosis not present

## 2022-04-04 DIAGNOSIS — L539 Erythematous condition, unspecified: Secondary | ICD-10-CM | POA: Diagnosis not present

## 2022-04-16 DIAGNOSIS — K219 Gastro-esophageal reflux disease without esophagitis: Secondary | ICD-10-CM | POA: Diagnosis not present

## 2022-04-16 DIAGNOSIS — E559 Vitamin D deficiency, unspecified: Secondary | ICD-10-CM | POA: Diagnosis not present

## 2022-04-16 DIAGNOSIS — M810 Age-related osteoporosis without current pathological fracture: Secondary | ICD-10-CM | POA: Diagnosis not present

## 2022-04-17 DIAGNOSIS — R590 Localized enlarged lymph nodes: Secondary | ICD-10-CM | POA: Diagnosis not present

## 2022-04-17 DIAGNOSIS — K1120 Sialoadenitis, unspecified: Secondary | ICD-10-CM | POA: Diagnosis not present

## 2022-05-03 ENCOUNTER — Encounter: Payer: Self-pay | Admitting: Internal Medicine

## 2022-05-07 ENCOUNTER — Other Ambulatory Visit: Payer: Self-pay | Admitting: Internal Medicine

## 2022-05-07 DIAGNOSIS — K1120 Sialoadenitis, unspecified: Secondary | ICD-10-CM

## 2022-05-17 DIAGNOSIS — E785 Hyperlipidemia, unspecified: Secondary | ICD-10-CM | POA: Diagnosis not present

## 2022-05-17 DIAGNOSIS — I10 Essential (primary) hypertension: Secondary | ICD-10-CM | POA: Diagnosis not present

## 2022-05-17 DIAGNOSIS — E559 Vitamin D deficiency, unspecified: Secondary | ICD-10-CM | POA: Diagnosis not present

## 2022-05-17 DIAGNOSIS — D649 Anemia, unspecified: Secondary | ICD-10-CM | POA: Diagnosis not present

## 2022-05-17 DIAGNOSIS — I700 Atherosclerosis of aorta: Secondary | ICD-10-CM | POA: Diagnosis not present

## 2022-05-23 DIAGNOSIS — Z23 Encounter for immunization: Secondary | ICD-10-CM | POA: Diagnosis not present

## 2022-05-23 DIAGNOSIS — Z87440 Personal history of urinary (tract) infections: Secondary | ICD-10-CM | POA: Diagnosis not present

## 2022-05-23 DIAGNOSIS — Z87891 Personal history of nicotine dependence: Secondary | ICD-10-CM | POA: Diagnosis not present

## 2022-05-23 DIAGNOSIS — D649 Anemia, unspecified: Secondary | ICD-10-CM | POA: Diagnosis not present

## 2022-05-23 DIAGNOSIS — K5120 Ulcerative (chronic) proctitis without complications: Secondary | ICD-10-CM | POA: Diagnosis not present

## 2022-05-23 DIAGNOSIS — Z1339 Encounter for screening examination for other mental health and behavioral disorders: Secondary | ICD-10-CM | POA: Diagnosis not present

## 2022-05-23 DIAGNOSIS — Z1331 Encounter for screening for depression: Secondary | ICD-10-CM | POA: Diagnosis not present

## 2022-05-23 DIAGNOSIS — Z0000 Encounter for general adult medical examination without abnormal findings: Secondary | ICD-10-CM | POA: Diagnosis not present

## 2022-05-23 DIAGNOSIS — I700 Atherosclerosis of aorta: Secondary | ICD-10-CM | POA: Diagnosis not present

## 2022-05-23 DIAGNOSIS — M810 Age-related osteoporosis without current pathological fracture: Secondary | ICD-10-CM | POA: Diagnosis not present

## 2022-05-23 DIAGNOSIS — I10 Essential (primary) hypertension: Secondary | ICD-10-CM | POA: Diagnosis not present

## 2022-05-23 DIAGNOSIS — N390 Urinary tract infection, site not specified: Secondary | ICD-10-CM | POA: Diagnosis not present

## 2022-05-23 DIAGNOSIS — J449 Chronic obstructive pulmonary disease, unspecified: Secondary | ICD-10-CM | POA: Diagnosis not present

## 2022-06-04 ENCOUNTER — Other Ambulatory Visit: Payer: PPO

## 2022-06-05 ENCOUNTER — Other Ambulatory Visit: Payer: PPO

## 2022-06-18 ENCOUNTER — Other Ambulatory Visit (HOSPITAL_COMMUNITY): Payer: Self-pay | Admitting: *Deleted

## 2022-06-20 ENCOUNTER — Ambulatory Visit (HOSPITAL_COMMUNITY)
Admission: RE | Admit: 2022-06-20 | Discharge: 2022-06-20 | Disposition: A | Discharge status: Home / Self Care | Payer: PPO | Source: Ambulatory Visit | Attending: Internal Medicine | Admitting: Internal Medicine

## 2022-06-20 DIAGNOSIS — M810 Age-related osteoporosis without current pathological fracture: Secondary | ICD-10-CM | POA: Diagnosis not present

## 2022-06-20 MED ORDER — ZOLEDRONIC ACID 5 MG/100ML IV SOLN
INTRAVENOUS | Status: AC
  Administered 2022-06-20: 5 mg via INTRAVENOUS
  Filled 2022-06-20: qty 100

## 2022-06-20 MED ORDER — ZOLEDRONIC ACID 5 MG/100ML IV SOLN
5.0000 mg | Freq: Once | INTRAVENOUS | Status: AC
Start: 2022-06-20 — End: 2022-06-20

## 2022-07-17 DIAGNOSIS — H01114 Allergic dermatitis of left upper eyelid: Secondary | ICD-10-CM | POA: Diagnosis not present

## 2022-07-17 DIAGNOSIS — H01111 Allergic dermatitis of right upper eyelid: Secondary | ICD-10-CM | POA: Diagnosis not present

## 2022-07-22 ENCOUNTER — Other Ambulatory Visit: Payer: Self-pay | Admitting: Gastroenterology

## 2022-07-22 DIAGNOSIS — H01114 Allergic dermatitis of left upper eyelid: Secondary | ICD-10-CM | POA: Diagnosis not present

## 2022-07-22 DIAGNOSIS — H01111 Allergic dermatitis of right upper eyelid: Secondary | ICD-10-CM | POA: Diagnosis not present

## 2022-08-10 ENCOUNTER — Other Ambulatory Visit: Payer: Self-pay | Admitting: Internal Medicine

## 2022-08-12 NOTE — Telephone Encounter (Signed)
Rx refill sent to pharmacy. 

## 2022-09-27 DIAGNOSIS — M810 Age-related osteoporosis without current pathological fracture: Secondary | ICD-10-CM | POA: Diagnosis not present

## 2022-09-27 DIAGNOSIS — J209 Acute bronchitis, unspecified: Secondary | ICD-10-CM | POA: Diagnosis not present

## 2022-09-27 DIAGNOSIS — J449 Chronic obstructive pulmonary disease, unspecified: Secondary | ICD-10-CM | POA: Diagnosis not present

## 2022-09-27 DIAGNOSIS — N3946 Mixed incontinence: Secondary | ICD-10-CM | POA: Diagnosis not present

## 2022-10-30 DIAGNOSIS — D692 Other nonthrombocytopenic purpura: Secondary | ICD-10-CM | POA: Diagnosis not present

## 2022-10-30 DIAGNOSIS — L814 Other melanin hyperpigmentation: Secondary | ICD-10-CM | POA: Diagnosis not present

## 2022-10-30 DIAGNOSIS — D2271 Melanocytic nevi of right lower limb, including hip: Secondary | ICD-10-CM | POA: Diagnosis not present

## 2022-10-30 DIAGNOSIS — I872 Venous insufficiency (chronic) (peripheral): Secondary | ICD-10-CM | POA: Diagnosis not present

## 2022-10-30 DIAGNOSIS — D3613 Benign neoplasm of peripheral nerves and autonomic nervous system of lower limb, including hip: Secondary | ICD-10-CM | POA: Diagnosis not present

## 2022-10-30 DIAGNOSIS — L570 Actinic keratosis: Secondary | ICD-10-CM | POA: Diagnosis not present

## 2022-10-30 DIAGNOSIS — I8312 Varicose veins of left lower extremity with inflammation: Secondary | ICD-10-CM | POA: Diagnosis not present

## 2022-10-30 DIAGNOSIS — D485 Neoplasm of uncertain behavior of skin: Secondary | ICD-10-CM | POA: Diagnosis not present

## 2022-10-30 DIAGNOSIS — I8311 Varicose veins of right lower extremity with inflammation: Secondary | ICD-10-CM | POA: Diagnosis not present

## 2022-10-30 DIAGNOSIS — L821 Other seborrheic keratosis: Secondary | ICD-10-CM | POA: Diagnosis not present

## 2022-10-30 DIAGNOSIS — Z85828 Personal history of other malignant neoplasm of skin: Secondary | ICD-10-CM | POA: Diagnosis not present

## 2022-10-30 DIAGNOSIS — D2272 Melanocytic nevi of left lower limb, including hip: Secondary | ICD-10-CM | POA: Diagnosis not present

## 2022-11-07 ENCOUNTER — Other Ambulatory Visit: Payer: Self-pay | Admitting: Internal Medicine

## 2022-11-29 DIAGNOSIS — M810 Age-related osteoporosis without current pathological fracture: Secondary | ICD-10-CM | POA: Diagnosis not present

## 2022-11-29 DIAGNOSIS — N3946 Mixed incontinence: Secondary | ICD-10-CM | POA: Diagnosis not present

## 2022-11-29 DIAGNOSIS — Z87891 Personal history of nicotine dependence: Secondary | ICD-10-CM | POA: Diagnosis not present

## 2022-11-29 DIAGNOSIS — I700 Atherosclerosis of aorta: Secondary | ICD-10-CM | POA: Diagnosis not present

## 2022-11-29 DIAGNOSIS — J449 Chronic obstructive pulmonary disease, unspecified: Secondary | ICD-10-CM | POA: Diagnosis not present

## 2022-11-29 DIAGNOSIS — D692 Other nonthrombocytopenic purpura: Secondary | ICD-10-CM | POA: Diagnosis not present

## 2022-11-29 DIAGNOSIS — E785 Hyperlipidemia, unspecified: Secondary | ICD-10-CM | POA: Diagnosis not present

## 2022-11-29 DIAGNOSIS — Z87440 Personal history of urinary (tract) infections: Secondary | ICD-10-CM | POA: Diagnosis not present

## 2022-11-29 DIAGNOSIS — D649 Anemia, unspecified: Secondary | ICD-10-CM | POA: Diagnosis not present

## 2022-11-29 DIAGNOSIS — I10 Essential (primary) hypertension: Secondary | ICD-10-CM | POA: Diagnosis not present

## 2022-11-29 DIAGNOSIS — I471 Supraventricular tachycardia, unspecified: Secondary | ICD-10-CM | POA: Diagnosis not present

## 2022-12-24 ENCOUNTER — Ambulatory Visit: Payer: PPO | Attending: Internal Medicine | Admitting: Internal Medicine

## 2022-12-24 ENCOUNTER — Encounter: Payer: Self-pay | Admitting: Internal Medicine

## 2022-12-24 VITALS — BP 114/60 | HR 83 | Ht 62.0 in | Wt 143.4 lb

## 2022-12-24 DIAGNOSIS — I2510 Atherosclerotic heart disease of native coronary artery without angina pectoris: Secondary | ICD-10-CM

## 2022-12-24 DIAGNOSIS — I2584 Coronary atherosclerosis due to calcified coronary lesion: Secondary | ICD-10-CM | POA: Diagnosis not present

## 2022-12-24 DIAGNOSIS — I491 Atrial premature depolarization: Secondary | ICD-10-CM | POA: Diagnosis not present

## 2022-12-24 DIAGNOSIS — I700 Atherosclerosis of aorta: Secondary | ICD-10-CM | POA: Diagnosis not present

## 2022-12-24 DIAGNOSIS — I10 Essential (primary) hypertension: Secondary | ICD-10-CM | POA: Diagnosis not present

## 2022-12-24 NOTE — Patient Instructions (Signed)

## 2022-12-24 NOTE — Progress Notes (Signed)
Cardiology Office Note:    Date:  12/24/2022   ID:  Ashley Rivera, DOB [birth year], MRN [id number]  PCP:  Melida Quitter, MD  [facility] Cardiologist:  Christell Constant, MD  [facility] Electrophysiologist:  None   Referring MD: Melida Quitter, MD   CC: PAC f/u  History of Present Illness:    Ashley Rivera is a 82 y.o. female with a hx of HTN, HLD, Rare PVCs, COPD, who established in 2021 for stress testing.  Had grossly normal stress test 2021.  2023: Had SVT with frequent PVCS, started on diltiazem; Had hemorrhoidectomy without complication  Patient notes that she is doing well.   Since last visit notes that she is traveling; out with her grandchildren . There are no interval hospital/ED visit.   EKG showed SR.  No chest pain or pressure. Stable breathing.  Has rare acid reflux.    No weight gain or leg swelling.  No palpitations or syncope.  Ambulatory blood pressure SBP 105 in the PM.  Felt no symptoms.   Past Medical History:  Diagnosis Date   Allergy    Asthma    bronchial asthma   Cataract    left eye   Complication of anesthesia    Became congested after colonoscopy   COPD (chronic obstructive pulmonary disease) (HCC)    Diverticulosis    Emphysema of lung (HCC)    Genital warts    Dx about 30 years ago    GERD (gastroesophageal reflux disease)    Hemorrhoids    History of hiatal hernia    History of kidney stones    Hx of colonic polyp    Hx: UTI (urinary tract infection)    Hypertension    Osteoporosis    Pneumonia    Proctitis 11/12/2011   Skin cancer    basal cell   Urinary incontinence    On occasion    Past Surgical History:  Procedure Laterality Date   BIOPSY  07/03/2020   Procedure: BIOPSY;  Surgeon: Napoleon Form, MD;  Location: [facility];  Service: Endoscopy;;   COLONOSCOPY     COLONOSCOPY WITH PROPOFOL N/A 10/28/2019   Procedure: COLONOSCOPY WITH PROPOFOL  LARGE CECAL POLYP;  Surgeon: Napoleon Form, MD;   Location: [facility];  Service: Endoscopy;  Laterality: N/A;   COLONOSCOPY WITH PROPOFOL N/A 07/03/2020   Procedure: COLONOSCOPY WITH PROPOFOL;  Surgeon: Napoleon Form, MD;  Location: [facility];  Service: Endoscopy;  Laterality: N/A;   CYSTOSCOPY/URETEROSCOPY/HOLMIUM LASER/STENT PLACEMENT Right 11/09/2019   Procedure: CYSTOSCOPY/URETEROSCOPY/HOLMIUM LASER/STENT PLACEMENT;  Surgeon: Jerilee Field, MD;  Location: [facility];  Service: Urology;  Laterality: Right;   DILATION AND CURETTAGE OF UTERUS     50 years ago. Removed rest of placenta from birth   ENDOSCOPIC MUCOSAL RESECTION N/A 10/28/2019   Procedure: ENDOSCOPIC MUCOSAL RESECTION;  Surgeon: Napoleon Form, MD;  Location: [facility];  Service: Endoscopy;  Laterality: N/A;   EVALUATION UNDER ANESTHESIA WITH HEMORRHOIDECTOMY N/A 11/28/2021   Procedure: EXAM UNDER ANESTHESIA WITH HEMORRHOIDECTOMY 1 VS 2 COLUMN;  Surgeon: Andria Meuse, MD;  Location: [facility];  Service: General;  Laterality: N/A;   EYE SURGERY     cataract   HEMOSTASIS CLIP PLACEMENT  10/28/2019   Procedure: HEMOSTASIS CLIP PLACEMENT;  Surgeon: Napoleon Form, MD;  Location: [facility];  Service: Endoscopy;;   INNER EAR SURGERY Right 2006   Tumor removed    POLYPECTOMY  10/28/2019   Procedure:  POLYPECTOMY;  Surgeon: Napoleon Form, MD;  Location: [facility];  Service: Endoscopy;;   POLYPECTOMY  07/03/2020   Procedure: POLYPECTOMY;  Surgeon: Napoleon Form, MD;  Location: [facility];  Service: Endoscopy;;   SUBMUCOSAL LIFTING INJECTION  10/28/2019   Procedure: SUBMUCOSAL LIFTING INJECTION;  Surgeon: Napoleon Form, MD;  Location: [facility];  Service: Endoscopy;;   SUBMUCOSAL TATTOO INJECTION  10/28/2019   Procedure: SUBMUCOSAL TATTOO INJECTION;  Surgeon: Napoleon Form, MD;  Location: [facility];  Service: Endoscopy;;   TONSILLECTOMY AND ADENOIDECTOMY  1950   Current Medications: Current Meds   Medication Sig   Budeson-Glycopyrrol-Formoterol (BREZTRI AEROSPHERE IN) Inhale into the lungs. 2 puffs twice per day   calcium carbonate (TUMS - DOSED IN MG ELEMENTAL CALCIUM) 500 MG chewable tablet Chew 1 tablet by mouth as needed for indigestion or heartburn.   carboxymethylcellulose (REFRESH PLUS) 0.5 % SOLN Place 2 drops into both eyes 2 (two) times daily as needed (dry/irritated eyes).   cetirizine (ZYRTEC) 10 MG tablet Take 10 mg by mouth daily as needed for allergies.    chlorthalidone (HYGROTON) 25 MG tablet Take 25 mg by mouth every other day.   Cholecalciferol (VITAMIN D3) 50 MCG (2000 UT) TABS Take 2,000 Units by mouth daily.   Cyanocobalamin (VITAMIN B12 PO) Take by mouth. daily   diltiazem (CARDIZEM CD) 120 MG 24 hr capsule TAKE 1 CAPSULE(120 MG) BY MOUTH DAILY   ezetimibe (ZETIA) 10 MG tablet Take 10 mg by mouth daily.   hydrocortisone 2.5 % cream as needed.   MELATONIN PO Take 8 mg by mouth at bedtime.   Omega-3 Fatty Acids (FISH OIL ULTRA) 1400 MG CAPS Take 1,400 mg by mouth daily.   pantoprazole (PROTONIX) 20 MG tablet Take 20 mg by mouth as needed for heartburn or indigestion.   Probiotic Product (ALIGN) 4 MG CAPS Take 4 mg by mouth daily.   sulfaSALAzine (AZULFIDINE) 500 MG EC tablet TAKE 2 TABLETS(1000 MG) BY MOUTH TWICE DAILY   tiotropium (SPIRIVA) 18 MCG inhalation capsule Place 18 mcg into inhaler and inhale daily as needed (asthma).    VENTOLIN HFA 108 (90 BASE) MCG/ACT inhaler Inhale 1 puff into the lungs as needed for wheezing or shortness of breath.    Wheat Dextrin (BENEFIBER PO) Take 1 Dose by mouth daily.   zoledronic acid (RECLAST) 5 MG/100ML SOLN injection Inject 5 mg into the vein once. yearly    Allergies:   Cephalexin   Social History   Socioeconomic History   Marital status: Married    Spouse name: Not on file   Number of children: 2   Years of education: Not on file   Highest education level: Not on file  Occupational History   Occupation:  Retired   Tobacco Use   Smoking status: Former    Current packs/day: 0.00    Types: Cigarettes    Start date: 10/04/1977    Quit date: 10/05/2007    Years since quitting: 15.2   Smokeless tobacco: Never  Vaping Use   Vaping status: Never Used  Substance and Sexual Activity   Alcohol use: Yes    Alcohol/week: 2.0 standard drinks of alcohol    Types: 2 Glasses of wine per week    Comment: once a week   Drug use: No   Sexual activity: Not Currently    Partners: Male    Birth control/protection: Post-menopausal  Other Topics Concern   Not on file  Social History Narrative   Daily caffeine  Social Determinants of Health   Financial Resource Strain: Not on file  Food Insecurity: Not on file  Transportation Needs: Not on file  Physical Activity: Not on file  Stress: Not on file  Social Connections: Not on file    Social: missed last visit as her husband had MI (2022), had HF and still smokes  Family History: The patient's family history includes Breast cancer in her maternal aunt; Colon cancer in her paternal grandmother; Lung cancer in her mother. There is no history of Esophageal cancer, Rectal cancer, Stomach cancer, or Pancreatic cancer.  ROS:   Please see the history of present illness.     EKGs/Labs/Other Studies Reviewed:    The following studies were reviewed today:  EKG:   06/20/21: SR rate 81, rare polymoprhic PVCs  Cardiac Studies & Procedures     STRESS TESTS  MYOCARDIAL PERFUSION IMAGING 02/09/2020  Narrative  Nuclear stress EF: 73%. No wall motion abnormalities  There was no ST segment deviation noted during stress.  The study is normal.  Supine images during stress today reveal a subtle inferoseptal defect however this defect is not apparent on upright images. This would be suggestive of some degree of attenuation artifact.  This is a low risk study. There are no perfusion defect suggestive of ischemia or infarction.  Donato Schultz, MD    ECHOCARDIOGRAM  ECHOCARDIOGRAM COMPLETE 06/28/2021  Narrative ECHOCARDIOGRAM REPORT    Patient Name:   DESDEMONA HOLSOPPLE [facility] Date of Exam: 06/28/2021 Medical Rec #:  [id number]     Height:       62.0 in Accession #:    [id number]    Weight:       143.0 lb Date of Birth:  [birth year]      BSA:          1.658 m Patient Age:    81 years      BP:           130/68 mmHg Patient Gender: F             HR:           61 bpm. Exam Location:  [address]  Procedure: 2D Echo, 3D Echo, Color Doppler and Cardiac Doppler  Indications:    Freuent PVCs I49.3; dyspnea R06.00  History:        Patient has no prior history of Echocardiogram examinations. COPD.  Sonographer:    Thurman Coyer RDCS Referring Phys: [id number] [facility] A Omaira Mellen  IMPRESSIONS   1. Left ventricular ejection fraction by 3D volume is 62 %. The left ventricle has normal function. The left ventricle has no regional wall motion abnormalities. Left ventricular diastolic parameters were normal. 2. Right ventricular systolic function is normal. The right ventricular size is normal. 3. The mitral valve is normal in structure. No evidence of mitral valve regurgitation. No evidence of mitral stenosis. 4. The aortic valve is tricuspid. Aortic valve regurgitation is not visualized. No aortic stenosis is present. 5. The inferior vena cava is normal in size with greater than 50% respiratory variability, suggesting right atrial pressure of 3 mmHg.  Comparison(s): No prior Echocardiogram.  FINDINGS Left Ventricle: Left ventricular ejection fraction by 3D volume is 62 %. The left ventricle has normal function. The left ventricle has no regional wall motion abnormalities. The left ventricular internal cavity size was normal in size. There is no left ventricular hypertrophy. Left ventricular diastolic parameters were normal.  Right Ventricle: The right ventricular size is normal. No increase in right  ventricular wall thickness. Right  ventricular systolic function is normal.  Left Atrium: Left atrial size was normal in size.  Right Atrium: Right atrial size was normal in size.  Pericardium: There is no evidence of pericardial effusion. Presence of epicardial fat layer.  Mitral Valve: The mitral valve is normal in structure. No evidence of mitral valve regurgitation. No evidence of mitral valve stenosis.  Tricuspid Valve: The tricuspid valve is normal in structure. Tricuspid valve regurgitation is not demonstrated. No evidence of tricuspid stenosis.  Aortic Valve: The aortic valve is tricuspid. Aortic valve regurgitation is not visualized. No aortic stenosis is present.  Pulmonic Valve: The pulmonic valve was normal in structure. Pulmonic valve regurgitation is not visualized. No evidence of pulmonic stenosis.  Aorta: The aortic root is normal in size and structure.  Venous: The inferior vena cava is normal in size with greater than 50% respiratory variability, suggesting right atrial pressure of 3 mmHg.  IAS/Shunts: No atrial level shunt detected by color flow Doppler.   LEFT VENTRICLE PLAX 2D LVIDd:         3.90 cm         Diastology LVIDs:         2.80 cm         LV e' medial:    6.20 cm/s LV PW:         0.80 cm         LV E/e' medial:  11.1 LV IVS:        0.90 cm         LV e' lateral:   10.80 cm/s LVOT diam:     2.40 cm         LV E/e' lateral: 6.4 LV SV:         81 LV SV Index:   49 LVOT Area:     4.52 cm        3D Volume EF LV 3D EF:    Left ventricul ar ejection fraction by 3D volume is 62 %.  3D Volume EF: 3D EF:        62 % LV EDV:       81 ml LV ESV:       31 ml LV SV:        50 ml  RIGHT VENTRICLE RV S prime:     15.70 cm/s TAPSE (M-mode): 1.9 cm  LEFT ATRIUM             Index        RIGHT ATRIUM           Index LA diam:        3.30 cm 1.99 cm/m   RA Area:     14.90 cm LA Vol (A2C):   37.0 ml 22.32 ml/m  RA Volume:   34.60 ml  20.87 ml/m LA Vol (A4C):   27.3 ml 16.47 ml/m LA  Biplane Vol: 32.4 ml 19.55 ml/m AORTIC VALVE LVOT Vmax:   92.80 cm/s LVOT Vmean:  61.300 cm/s LVOT VTI:    0.180 m  AORTA Ao Root diam: 3.10 cm Ao Asc diam:  3.00 cm  MITRAL VALVE MV Area (PHT): 4.29 cm    SHUNTS MV Decel Time: 177 msec    Systemic VTI:  0.18 m MV E velocity: 68.70 cm/s  Systemic Diam: 2.40 cm MV A velocity: 90.70 cm/s MV E/A ratio:  0.76  Kardie Tobb DO Electronically signed by Thomasene Ripple DO Signature Date/Time: 06/28/2021/4:45:13 PM  Final    MONITORS  LONG TERM MONITOR (3-14 DAYS) 07/19/2021  Narrative  Patient had a minimum heart rate of 63 bpm, maximum heart rate of 200 bpm, and average heart rate of 82 bpm.  Predominant underlying rhythm was sinus rhythm.  Many runs of SVT occurred lasting 14 seconds at longest with a max rate of 200 bpm at fastest.  Isolated PACs were frequent (7%).  Triggered and diary events associated with sinus rhythm, PACs, and PVCs.  Asymptomatic SVT.            Recent Labs: No results found for requested labs within last 365 days.   Physical Exam:    VS:  BP 114/60   Pulse 83   Ht 5\' 2"  (1.575 m)   Wt 143 lb 6.4 oz (65 kg)   SpO2 94%   BMI 26.23 kg/m     Wt Readings from Last 3 Encounters:  12/24/22 143 lb 6.4 oz (65 kg)  11/28/21 146 lb 9.6 oz (66.5 kg)  11/26/21 146 lb (66.2 kg)    Gen: no distress, elderly female   Neck: No JVD Cardiac: No Rubs or gallops, no murmur, RRR +2 radial pulses; distant heart sounds Respiratory: Clear to auscultation bilaterally, normal effort, normal respiratory rate GI: Soft, nontender, non-distended  MS: no edema;  moves all extremities Integument: Skin feels warm Neuro:  At time of evaluation, alert and oriented to person/place/time/situation  Psych: Normal affect, patient feels OK   ASSESSMENT:    1. PAC (premature atrial contraction)   2. Essential hypertension   3. Coronary artery calcification   4. Hardening of the aorta (main artery of the heart)  (HCC)    PLAN:    HTN Frequent PACs and SVT - resolved on current therapy - if symptomatic hypotension, will trial either her nightly diltiazem OR her q48 diuretic  HLD with LAD CAC and aortic atherosclerosis - reviewed CT - she will get labs with her PCP, if LDL is still above goal, will start zetia 10 mg - if CP with activity with get CCTA - continue current therapy  Former Tobacco Abuse COPD - discussed smoke avoidance; it is difficult as her husband still smokes.  One year unless new sx     Medication Adjustments/Labs and Tests Ordered: Current medicines are reviewed at length with the patient today.  Concerns regarding medicines are outlined above.  Orders Placed This Encounter  Procedures   EKG 12-Lead   No orders of the defined types were placed in this encounter.   Patient Instructions  Medication Instructions:  Your physician recommends that you continue on your current medications as directed. Please refer to the Current Medication list given to you today.  *If you need a refill on your cardiac medications before your next appointment, please call your pharmacy*   Lab Work: NONE If you have labs (blood work) drawn today and your tests are completely normal, you will receive your results only by: MyChart Message (if you have MyChart) OR A paper copy in the mail If you have any lab test that is abnormal or we need to change your treatment, we will call you to review the results.   Testing/Procedures: NONE   Follow-Up: At [facility], you and your health needs are our priority.  As part of our continuing mission to provide you with exceptional heart care, we have created designated Provider Care Teams.  These Care Teams include your primary Cardiologist (physician) and Advanced Practice Providers (APPs -  Physician Assistants and Nurse Practitioners) who all work together to provide you with the care you need, when you need it.   Your next  appointment:   1 year(s)  Provider:   Riley Lam, MD       Signed, Christell Constant, MD  12/24/2022 11:16 AM    [facility]

## 2022-12-27 DIAGNOSIS — N201 Calculus of ureter: Secondary | ICD-10-CM | POA: Diagnosis not present

## 2022-12-27 DIAGNOSIS — N200 Calculus of kidney: Secondary | ICD-10-CM | POA: Diagnosis not present

## 2022-12-27 DIAGNOSIS — K449 Diaphragmatic hernia without obstruction or gangrene: Secondary | ICD-10-CM | POA: Diagnosis not present

## 2022-12-27 DIAGNOSIS — N202 Calculus of kidney with calculus of ureter: Secondary | ICD-10-CM | POA: Diagnosis not present

## 2022-12-27 DIAGNOSIS — N130 Hydronephrosis with ureteropelvic junction obstruction: Secondary | ICD-10-CM | POA: Diagnosis not present

## 2023-01-10 DIAGNOSIS — Z961 Presence of intraocular lens: Secondary | ICD-10-CM | POA: Diagnosis not present

## 2023-01-10 DIAGNOSIS — H5203 Hypermetropia, bilateral: Secondary | ICD-10-CM | POA: Diagnosis not present

## 2023-01-10 DIAGNOSIS — H1789 Other corneal scars and opacities: Secondary | ICD-10-CM | POA: Diagnosis not present

## 2023-02-10 ENCOUNTER — Other Ambulatory Visit: Payer: Self-pay | Admitting: Internal Medicine

## 2023-03-03 DIAGNOSIS — H04123 Dry eye syndrome of bilateral lacrimal glands: Secondary | ICD-10-CM | POA: Diagnosis not present

## 2023-03-03 DIAGNOSIS — H16232 Neurotrophic keratoconjunctivitis, left eye: Secondary | ICD-10-CM | POA: Diagnosis not present

## 2023-03-19 DIAGNOSIS — Z1231 Encounter for screening mammogram for malignant neoplasm of breast: Secondary | ICD-10-CM | POA: Diagnosis not present

## 2023-03-19 DIAGNOSIS — H16232 Neurotrophic keratoconjunctivitis, left eye: Secondary | ICD-10-CM | POA: Diagnosis not present

## 2023-03-21 DIAGNOSIS — N390 Urinary tract infection, site not specified: Secondary | ICD-10-CM | POA: Diagnosis not present

## 2023-04-10 DIAGNOSIS — M810 Age-related osteoporosis without current pathological fracture: Secondary | ICD-10-CM | POA: Diagnosis not present

## 2023-04-10 DIAGNOSIS — J40 Bronchitis, not specified as acute or chronic: Secondary | ICD-10-CM | POA: Diagnosis not present

## 2023-04-10 DIAGNOSIS — Z87440 Personal history of urinary (tract) infections: Secondary | ICD-10-CM | POA: Diagnosis not present

## 2023-04-10 DIAGNOSIS — Z87891 Personal history of nicotine dependence: Secondary | ICD-10-CM | POA: Diagnosis not present

## 2023-04-10 DIAGNOSIS — D649 Anemia, unspecified: Secondary | ICD-10-CM | POA: Diagnosis not present

## 2023-04-10 DIAGNOSIS — R0602 Shortness of breath: Secondary | ICD-10-CM | POA: Diagnosis not present

## 2023-04-10 DIAGNOSIS — N3946 Mixed incontinence: Secondary | ICD-10-CM | POA: Diagnosis not present

## 2023-04-10 DIAGNOSIS — R5383 Other fatigue: Secondary | ICD-10-CM | POA: Diagnosis not present

## 2023-04-10 DIAGNOSIS — J449 Chronic obstructive pulmonary disease, unspecified: Secondary | ICD-10-CM | POA: Diagnosis not present

## 2023-06-02 ENCOUNTER — Other Ambulatory Visit: Payer: Self-pay | Admitting: Neurosurgery

## 2023-06-02 DIAGNOSIS — D329 Benign neoplasm of meninges, unspecified: Secondary | ICD-10-CM

## 2023-06-12 DIAGNOSIS — D649 Anemia, unspecified: Secondary | ICD-10-CM | POA: Diagnosis not present

## 2023-06-12 DIAGNOSIS — M810 Age-related osteoporosis without current pathological fracture: Secondary | ICD-10-CM | POA: Diagnosis not present

## 2023-06-12 DIAGNOSIS — I10 Essential (primary) hypertension: Secondary | ICD-10-CM | POA: Diagnosis not present

## 2023-06-12 DIAGNOSIS — E785 Hyperlipidemia, unspecified: Secondary | ICD-10-CM | POA: Diagnosis not present

## 2023-06-20 DIAGNOSIS — E559 Vitamin D deficiency, unspecified: Secondary | ICD-10-CM | POA: Diagnosis not present

## 2023-06-20 DIAGNOSIS — R102 Pelvic and perineal pain: Secondary | ICD-10-CM | POA: Diagnosis not present

## 2023-06-20 DIAGNOSIS — Z1339 Encounter for screening examination for other mental health and behavioral disorders: Secondary | ICD-10-CM | POA: Diagnosis not present

## 2023-06-20 DIAGNOSIS — Z1331 Encounter for screening for depression: Secondary | ICD-10-CM | POA: Diagnosis not present

## 2023-06-20 DIAGNOSIS — I700 Atherosclerosis of aorta: Secondary | ICD-10-CM | POA: Diagnosis not present

## 2023-06-20 DIAGNOSIS — J449 Chronic obstructive pulmonary disease, unspecified: Secondary | ICD-10-CM | POA: Diagnosis not present

## 2023-06-20 DIAGNOSIS — H6120 Impacted cerumen, unspecified ear: Secondary | ICD-10-CM | POA: Diagnosis not present

## 2023-06-20 DIAGNOSIS — M810 Age-related osteoporosis without current pathological fracture: Secondary | ICD-10-CM | POA: Diagnosis not present

## 2023-06-20 DIAGNOSIS — I10 Essential (primary) hypertension: Secondary | ICD-10-CM | POA: Diagnosis not present

## 2023-06-20 DIAGNOSIS — D329 Benign neoplasm of meninges, unspecified: Secondary | ICD-10-CM | POA: Diagnosis not present

## 2023-06-20 DIAGNOSIS — Z0000 Encounter for general adult medical examination without abnormal findings: Secondary | ICD-10-CM | POA: Diagnosis not present

## 2023-06-20 DIAGNOSIS — E785 Hyperlipidemia, unspecified: Secondary | ICD-10-CM | POA: Diagnosis not present

## 2023-06-20 DIAGNOSIS — K5120 Ulcerative (chronic) proctitis without complications: Secondary | ICD-10-CM | POA: Diagnosis not present

## 2023-06-20 DIAGNOSIS — I471 Supraventricular tachycardia, unspecified: Secondary | ICD-10-CM | POA: Diagnosis not present

## 2023-07-06 ENCOUNTER — Ambulatory Visit
Admission: RE | Admit: 2023-07-06 | Discharge: 2023-07-06 | Disposition: A | Discharge status: Home / Self Care | Payer: PPO | Source: Ambulatory Visit | Attending: Neurosurgery | Admitting: Neurosurgery

## 2023-07-06 DIAGNOSIS — D329 Benign neoplasm of meninges, unspecified: Secondary | ICD-10-CM

## 2023-07-06 MED ORDER — GADOPICLENOL 0.5 MMOL/ML IV SOLN
6.5000 mL | Freq: Once | INTRAVENOUS | Status: AC | PRN
  Administered 2023-07-06: 6.5 mL via INTRAVENOUS

## 2023-07-11 ENCOUNTER — Other Ambulatory Visit: Payer: Self-pay | Admitting: Gastroenterology

## 2023-07-23 DIAGNOSIS — R300 Dysuria: Secondary | ICD-10-CM | POA: Diagnosis not present

## 2023-07-23 DIAGNOSIS — J449 Chronic obstructive pulmonary disease, unspecified: Secondary | ICD-10-CM | POA: Diagnosis not present

## 2023-07-23 DIAGNOSIS — R0981 Nasal congestion: Secondary | ICD-10-CM | POA: Diagnosis not present

## 2023-07-23 DIAGNOSIS — D329 Benign neoplasm of meninges, unspecified: Secondary | ICD-10-CM | POA: Diagnosis not present

## 2023-07-23 DIAGNOSIS — R058 Other specified cough: Secondary | ICD-10-CM | POA: Diagnosis not present

## 2023-07-23 DIAGNOSIS — N390 Urinary tract infection, site not specified: Secondary | ICD-10-CM | POA: Diagnosis not present

## 2023-08-28 DIAGNOSIS — M810 Age-related osteoporosis without current pathological fracture: Secondary | ICD-10-CM | POA: Diagnosis not present

## 2023-08-28 DIAGNOSIS — R590 Localized enlarged lymph nodes: Secondary | ICD-10-CM | POA: Diagnosis not present

## 2023-08-28 DIAGNOSIS — E559 Vitamin D deficiency, unspecified: Secondary | ICD-10-CM | POA: Diagnosis not present

## 2023-08-28 DIAGNOSIS — I10 Essential (primary) hypertension: Secondary | ICD-10-CM | POA: Diagnosis not present

## 2023-09-03 ENCOUNTER — Encounter: Payer: Self-pay | Admitting: Physician Assistant

## 2023-09-03 ENCOUNTER — Ambulatory Visit: Admitting: Physician Assistant

## 2023-09-03 VITALS — BP 118/64 | HR 81 | Ht 62.0 in | Wt 144.0 lb

## 2023-09-03 DIAGNOSIS — K5120 Ulcerative (chronic) proctitis without complications: Secondary | ICD-10-CM | POA: Diagnosis not present

## 2023-09-03 MED ORDER — SULFASALAZINE 500 MG PO TBEC: 1000.0000 mg | DELAYED_RELEASE_TABLET | Freq: Two times a day (BID) | ORAL | 3 refills | Status: AC

## 2023-09-03 NOTE — Progress Notes (Signed)
 Chief Complaint: Follow-up ulcerative proctitis  HPI:    This is Ashley Rivera is an 83 year old female with a past medical history as listed below including ulcerative proctitis on Sulfasalazine , COPD, emphysema and multiple others, known to Dr. Leonia Raman, who presents to clinic today for follow-up of her ulcerative proctitis.    04/10/2020 patient seen in clinic for history of colon polyps and rectal bleeding.  At that time started on Anusol  suppositories and recommended a colonoscopy.    07/03/2020 colonoscopy with 1 less than 1 mm polyp in the cecum, 3 4-6 mm polyps in the rectum, sigmoid colon and transverse colon as well as severe diverticulosis in the sigmoid, descending, transverse and ascending colon.  Evidence of diverticular spasm.  Prolapsed external and internal hemorrhoids.  Patient was given more Hydrocortisone  suppositories and told she did not need repeat colonoscopy due to age.  Pathology showed sessile serrated adenoma and hyperplastic polyp without cytologic dysplasia in the sigmoid, transverse and rectum.  The cecum polypectomies polypoid colonic mucosa.    04/25/2021 patient seen in clinic and described that she had been told she was anemic back in July and it started oral therapy.  Also discussed some reflux symptoms.  Also describes some bleeding with bowel movements.  She had repeat labs in December with no further anemia.  Discussed her rectal bleeding and recommended repeat Hydrocortisone  ointment on Preparation H suppositories twice a day x1 to 2 weeks.  Also recommend she take her Pantoprazole  40 mg for reflux.  At that time discussed possibility of EGD if she was still anemic.    04/25/2021 CBC was normal.  Her iron levels were still minimally decreased with a percent saturation of 15.5%.  It was recommended that she stay on her oral iron supplementation for another month.    07/22/2021 patient contacted us  in regards to still having some rectal bleeding.  Patient described still having  off-and-on rectal bleeding regardless of using the suppositories at times.    08/02/2021 patient seen in clinic and at that time described bleeding with every bowel movement, this is regardless of Hydrocortisone  ointment on Preparation H suppositories twice daily for 2 weeks.  At that time continued on Sulfasalazine .  Discussed the possibility of Canasa  suppositories which the patient said were too expensive for her.  Patient referred to Dr. Camilo Cella at [facility] for removal of ulcerated mucosa from the rectum.    12/24/2021 patient seen by general surgery and had a hemorrhoidectomy.    Today, the patient presents to clinic and tells me that she is doing well.  She is still on Sulfasalazine , most days she will just take two 500 mg tabs in the morning and she is having no complaints or concerns.    Denies fever, chills or weight loss.  Past Medical History:  Diagnosis Date   Allergy    Asthma    bronchial asthma   Cataract    left eye   Complication of anesthesia    Became congested after colonoscopy   COPD (chronic obstructive pulmonary disease) (HCC)    Diverticulosis    Emphysema of lung (HCC)    Genital warts    Dx about 30 years ago    GERD (gastroesophageal reflux disease)    Hemorrhoids    History of hiatal hernia    History of kidney stones    Hx of colonic polyp    Hx: UTI (urinary tract infection)    Hypertension    Osteoporosis    Pneumonia  Proctitis 11/12/2011   Skin cancer    basal cell   Urinary incontinence    On occasion    Past Surgical History:  Procedure Laterality Date   BIOPSY  07/03/2020   Procedure: BIOPSY;  Surgeon: Sergio Dandy, MD;  Location: [facility];  Service: Endoscopy;;   COLONOSCOPY     COLONOSCOPY WITH PROPOFOL  N/A 10/28/2019   Procedure: COLONOSCOPY WITH PROPOFOL   LARGE CECAL POLYP;  Surgeon: Sergio Dandy, MD;  Location: [facility];  Service: Endoscopy;  Laterality: N/A;   COLONOSCOPY WITH PROPOFOL  N/A 07/03/2020   Procedure:  COLONOSCOPY WITH PROPOFOL ;  Surgeon: Sergio Dandy, MD;  Location: [facility];  Service: Endoscopy;  Laterality: N/A;   CYSTOSCOPY/URETEROSCOPY/HOLMIUM LASER/STENT PLACEMENT Right 11/09/2019   Procedure: CYSTOSCOPY/URETEROSCOPY/HOLMIUM LASER/STENT PLACEMENT;  Surgeon: Christina Coyer, MD;  Location: [facility];  Service: Urology;  Laterality: Right;   DILATION AND CURETTAGE OF UTERUS     50 years ago. Removed rest of placenta from birth   ENDOSCOPIC MUCOSAL RESECTION N/A 10/28/2019   Procedure: ENDOSCOPIC MUCOSAL RESECTION;  Surgeon: Sergio Dandy, MD;  Location: [facility];  Service: Endoscopy;  Laterality: N/A;   EVALUATION UNDER ANESTHESIA WITH HEMORRHOIDECTOMY N/A 11/28/2021   Procedure: EXAM UNDER ANESTHESIA WITH HEMORRHOIDECTOMY 1 VS 2 COLUMN;  Surgeon: Melvenia Stabs, MD;  Location: [facility];  Service: General;  Laterality: N/A;   EYE SURGERY     cataract   HEMOSTASIS CLIP PLACEMENT  10/28/2019   Procedure: HEMOSTASIS CLIP PLACEMENT;  Surgeon: Sergio Dandy, MD;  Location: [facility];  Service: Endoscopy;;   INNER EAR SURGERY Right 2006   Tumor removed    POLYPECTOMY  10/28/2019   Procedure: POLYPECTOMY;  Surgeon: Sergio Dandy, MD;  [facility]: [facility];  Service: Endoscopy;;   POLYPECTOMY  07/03/2020   Procedure: POLYPECTOMY;  Surgeon: Sergio Dandy, MD;  Location: [facility];  Service: Endoscopy;;   SUBMUCOSAL LIFTING INJECTION  10/28/2019   Procedure: SUBMUCOSAL LIFTING INJECTION;  Surgeon: Nandigam, Kavitha V, MD;  Location: [facility];  Service: Endoscopy;;   SUBMUCOSAL TATTOO INJECTION  10/28/2019   Procedure: SUBMUCOSAL TATTOO INJECTION;  Surgeon: Nandigam, Kavitha V, MD;  Location: [facility];  Service: Endoscopy;;   TONSILLECTOMY AND ADENOIDECTOMY  1950    Current Outpatient Medications  Medication Sig Dispense Refill   Budeson-Glycopyrrol-Formoterol (BREZTRI AEROSPHERE IN) Inhale into the lungs. 2 puffs twice per  day     calcium carbonate (TUMS - DOSED IN MG ELEMENTAL CALCIUM) 500 MG chewable tablet Chew 1 tablet by mouth as needed for indigestion or heartburn.     carboxymethylcellulose (REFRESH PLUS) 0.5 % SOLN Place 2 drops into both eyes 2 (two) times daily as needed (dry/irritated eyes).     cetirizine (ZYRTEC) 10 MG tablet Take 10 mg by mouth daily as needed for allergies.      chlorthalidone (HYGROTON) 25 MG tablet Take 25 mg by mouth every other day.     Cholecalciferol (VITAMIN D3) 50 MCG (2000 UT) TABS Take 2,000 Units by mouth daily.     Cyanocobalamin  (VITAMIN B12 PO) Take by mouth. daily     diltiazem  (CARDIZEM  CD) 120 MG 24 hr capsule TAKE 1 CAPSULE(120 MG) BY MOUTH DAILY 90 capsule 2   ezetimibe (ZETIA) 10 MG tablet Take 10 mg by mouth daily.     hydrocortisone  2.5 % cream as needed.     MELATONIN PO Take 8 mg by mouth at bedtime.     Omega-3 Fatty Acids (FISH OIL ULTRA)  1400 MG CAPS Take 1,400 mg by mouth daily.     pantoprazole  (PROTONIX ) 20 MG tablet Take 20 mg by mouth as needed for heartburn or indigestion.     Probiotic Product (ALIGN) 4 MG CAPS Take 4 mg by mouth daily.     sulfaSALAzine  (AZULFIDINE ) 500 MG EC tablet TAKE 2 TABLETS BY MOUTH TWICE A DAY 120 tablet 2   tiotropium (SPIRIVA) 18 MCG inhalation capsule Place 18 mcg into inhaler and inhale daily as needed (asthma).      VENTOLIN HFA 108 (90 BASE) MCG/ACT inhaler Inhale 1 puff into the lungs as needed for wheezing or shortness of breath.   2   Wheat Dextrin (BENEFIBER PO) Take 1 Dose by mouth daily.     zoledronic  acid (RECLAST ) 5 MG/100ML SOLN injection Inject 5 mg into the vein once. yearly     No current facility-administered medications for this visit.    Allergies as of 09/03/2023 - Review Complete 09/03/2023  Allergen Reaction Noted   Cephalexin   04/10/2021    Family History  Problem Relation Age of Onset   Lung cancer Mother        smoker but had quit   Heart disease Father    Colon cancer Paternal  Grandmother    Breast cancer Maternal Aunt    Esophageal cancer Neg Hx    Rectal cancer Neg Hx    Stomach cancer Neg Hx    Pancreatic cancer Neg Hx     Social History   Socioeconomic History   Marital status: Married    Spouse name: Not on file   Number of children: 2   Years of education: Not on file   Highest education level: Not on file  Occupational History   Occupation: Retired   Tobacco Use   Smoking status: Former    Current packs/day: 0.00    Types: Cigarettes    Start date: 10/04/1977    Quit date: 10/05/2007    Years since quitting: 15.9   Smokeless tobacco: Never  Vaping Use   Vaping status: Never Used  Substance and Sexual Activity   Alcohol  use: Yes    Alcohol /week: 2.0 standard drinks of alcohol     Types: 2 Glasses of wine per week    Comment: once a week   Drug use: No   Sexual activity: Not Currently    Partners: Male    Birth control/protection: Post-menopausal  Other Topics Concern   Not on file  Social History Narrative   Daily caffeine    Social Drivers of Corporate investment banker Strain: Not on file  Food Insecurity: Not on file  Transportation Needs: Not on file  Physical Activity: Not on file  Stress: Not on file  Social Connections: Not on file  Intimate Partner Violence: Not on file    Review of Systems:    Constitutional: No weight loss, fever or chills Cardiovascular: No chest pain   Respiratory: No SOB  Gastrointestinal: See HPI and otherwise negative   Physical Exam:  Vital signs: BP 118/64   Pulse 81   Ht 5\' 2"  (1.575 m)   Wt 144 lb (65.3 kg)   BMI 26.34 kg/m    Constitutional:   Pleasant elderly Caucasian female appears to be in NAD, Well developed, Well nourished, alert and cooperative Respiratory: Respirations even and unlabored. Lungs clear to auscultation bilaterally.   No wheezes, crackles, or rhonchi.  Cardiovascular: Normal S1, S2. No MRG. Regular rate and rhythm. No peripheral edema, cyanosis  or pallor.   Gastrointestinal:  Soft, nondistended, nontender. No rebound or guarding. Normal bowel sounds. No appreciable masses or hepatomegaly. Rectal:  Not performed.  Psychiatric: Demonstrates good judgement and reason without abnormal affect or behaviors.  RELEVANT LABS AND IMAGING: CBC    Component Value Date/Time   WBC 7.1 08/02/2021 0952   RBC 4.34 08/02/2021 0952   HGB 12.6 11/28/2021 0742   HCT 37.0 11/28/2021 0742   PLT 153.0 08/02/2021 0952   MCV 90.1 08/02/2021 0952   MCH 29.3 11/04/2019 1132   MCHC 32.6 08/02/2021 0952   RDW 14.0 08/02/2021 0952   LYMPHSABS 1.4 04/25/2021 1547   MONOABS 0.7 04/25/2021 1547   EOSABS 0.1 04/25/2021 1547   BASOSABS 0.1 04/25/2021 1547    CMP     Component Value Date/Time   NA 142 11/28/2021 0742   K 3.6 11/28/2021 0742   CL 102 11/28/2021 0742   CO2 30 11/04/2019 1132   GLUCOSE 98 11/28/2021 0742   BUN 22 11/28/2021 0742   CREATININE 0.70 11/28/2021 0742   CALCIUM 9.4 11/04/2019 1132   PROT 6.8 07/30/2019 1214   ALBUMIN 4.0 07/30/2019 1214   AST 20 07/30/2019 1214   ALT 17 07/30/2019 1214   ALKPHOS 55 07/30/2019 1214   BILITOT 0.5 07/30/2019 1214   GFRNONAA >60 11/04/2019 1132   GFRAA >60 11/04/2019 1132    Assessment: 1.  Ulcerative proctitis: Controlled on Sulfasalazine  1000 mg daily  Plan: 1.  Continue Sulfasalazine , refill given for 90 days with 3 refills. 2.  Requesting recent labs from PCP for medication maintenance. 3.  Patient to follow in clinic with us  in a year or sooner if necessary.  Reginal Capra, PA-C [facility] 09/03/2023, 10:31 AM  Cc: Azalia Leo, MD

## 2023-09-09 ENCOUNTER — Other Ambulatory Visit (HOSPITAL_COMMUNITY): Payer: Self-pay | Admitting: *Deleted

## 2023-09-10 ENCOUNTER — Ambulatory Visit (HOSPITAL_COMMUNITY)
Admission: RE | Admit: 2023-09-10 | Discharge: 2023-09-10 | Disposition: A | Discharge status: Home / Self Care | Source: Ambulatory Visit | Attending: Internal Medicine | Admitting: Internal Medicine

## 2023-09-10 DIAGNOSIS — M810 Age-related osteoporosis without current pathological fracture: Secondary | ICD-10-CM | POA: Diagnosis not present

## 2023-09-10 MED ORDER — ZOLEDRONIC ACID 5 MG/100ML IV SOLN: 5.0000 mg | Freq: Once | INTRAVENOUS | Status: AC

## 2023-09-10 MED ORDER — ZOLEDRONIC ACID 5 MG/100ML IV SOLN
INTRAVENOUS | Status: AC
  Administered 2023-09-10: 5 mg via INTRAVENOUS
  Filled 2023-09-10: qty 100

## 2023-09-17 ENCOUNTER — Ambulatory Visit (HOSPITAL_COMMUNITY): Admission: EM | Admit: 2023-09-17 | Discharge: 2023-09-17 | Disposition: A | Discharge status: Home / Self Care

## 2023-09-17 ENCOUNTER — Encounter (HOSPITAL_COMMUNITY): Payer: Self-pay | Admitting: *Deleted

## 2023-09-17 DIAGNOSIS — Z5189 Encounter for other specified aftercare: Secondary | ICD-10-CM | POA: Diagnosis not present

## 2023-09-17 DIAGNOSIS — S41111A Laceration without foreign body of right upper arm, initial encounter: Secondary | ICD-10-CM

## 2023-09-17 NOTE — Discharge Instructions (Signed)
 Clean the area with mild soap and water. You can apply Neosporin or Bacitracin when doing dressing changes. Keep clean, dry, and covered. If you notice spreading of redness, swelling, or pus-like drainage from the wound return here for re-evaluation.

## 2023-09-17 NOTE — ED Triage Notes (Addendum)
 States 3 days ago sustained a laceration to right upper arm when her foot fell asleep and she stumbled, bumping into the corner of a wall. States she applied Neosporin and has kept it covered and wearing Ace wrap, but wishes to know that she's doing appropriate treatment at home.  Large full skin avulsion noted to right upper arm with surrounding ecchymosis.

## 2023-09-17 NOTE — ED Provider Notes (Signed)
 [facility]    CSN: [id number] Arrival date & time: 09/17/23  1347      History   Chief Complaint No chief complaint on file.   HPI Ashley Rivera is a 83 y.o. female.   Patient presents requesting wound check for injury to her right upper arm that occurred on 5/11.  Patient states that she stood up after sitting for a while and her foot fell asleep which caused her to stumble and fall into a door frame in her house.  Patient now has a large skin tear to her right upper arm.  Patient states that there was a flap of skin that was crumpled up and hanging on that she cut off because she believed it was nonviable.  Patient states that she has been applying Neosporin and has kept it covered and wearing an Ace wrap since the injury occurred.  Patient wants to be sure that she is taking care of this wound properly.  Denies any spreading of redness, swelling, or purulent drainage from the area.  The history is provided by the patient and medical records.    Past Medical History:  Diagnosis Date   Allergy    Asthma    bronchial asthma   Cataract    left eye   Complication of anesthesia    Became congested after colonoscopy   COPD (chronic obstructive pulmonary disease) (HCC)    Diverticulosis    Emphysema of lung (HCC)    Genital warts    Dx about 30 years ago    GERD (gastroesophageal reflux disease)    Hemorrhoids    History of hiatal hernia    History of kidney stones    Hx of colonic polyp    Hx: UTI (urinary tract infection)    Hypertension    Osteoporosis    Pneumonia    Proctitis 11/12/2011   Skin cancer    basal cell   Urinary incontinence    On occasion    Patient Active Problem List   Diagnosis Date Noted   PAC (premature atrial contraction) 11/26/2021   Paroxysmal SVT (supraventricular tachycardia) (HCC) 11/26/2021   Frequent PVCs 06/20/2021   Unspecified open wound, right lower leg, initial encounter 04/23/2021   Hardening of the aorta (main  artery of the heart) (HCC) 04/23/2021   Essential hypertension 04/23/2021   Anemia 04/23/2021   Meningioma (HCC) 07/10/2020   Polyp of rectum    Chronic obstructive pulmonary disease (HCC) 01/28/2020   Coronary artery calcification 01/28/2020   SOB (shortness of breath) 01/28/2020   Polyp of cecum    Non-thrombocytopenic purpura (HCC) 11/11/2015   Microscopic hematuria 10/26/2015   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   Diaphragmatic hernia 03/03/2015   GERD (gastroesophageal reflux disease) 01/17/2015   Vitamin D deficiency 10/31/2014   Peripheral venous insufficiency 09/16/2012   Pruritus ani 12/04/2011   Proctitis 12/04/2011   History of colonic polyps 10/22/2011   Internal hemorrhoids 10/22/2011   Hyperlipidemia 02/04/2009   Age-related osteoporosis without current pathological fracture 02/04/2009    Past Surgical History:  Procedure Laterality Date   BIOPSY  07/03/2020   Procedure: BIOPSY;  Surgeon: Sergio Dandy, MD;  Location: [facility];  Service: Endoscopy;;   COLONOSCOPY     COLONOSCOPY WITH PROPOFOL  N/A 10/28/2019   Procedure: COLONOSCOPY WITH PROPOFOL   LARGE CECAL POLYP;  Surgeon: Nandigam, Kavitha V, MD;  Location: [facility];  Service: Endoscopy;  Laterality: N/A;   COLONOSCOPY WITH PROPOFOL  N/A 07/03/2020   Procedure:  COLONOSCOPY WITH PROPOFOL ;  Surgeon: Sergio Dandy, MD;  Location: [facility];  Service: Endoscopy;  Laterality: N/A;   CYSTOSCOPY/URETEROSCOPY/HOLMIUM LASER/STENT PLACEMENT Right 11/09/2019   Procedure: CYSTOSCOPY/URETEROSCOPY/HOLMIUM LASER/STENT PLACEMENT;  Surgeon: Christina Coyer, MD;  Location: [facility];  Service: Urology;  Laterality: Right;   DILATION AND CURETTAGE OF UTERUS     50 years ago. Removed rest of placenta from birth   ENDOSCOPIC MUCOSAL RESECTION N/A 10/28/2019   Procedure: ENDOSCOPIC MUCOSAL RESECTION;  Surgeon: Sergio Dandy, MD;  Location: [facility];  Service: Endoscopy;  Laterality: N/A;   EVALUATION UNDER  ANESTHESIA WITH HEMORRHOIDECTOMY N/A 11/28/2021   Procedure: EXAM UNDER ANESTHESIA WITH HEMORRHOIDECTOMY 1 VS 2 COLUMN;  Surgeon: Melvenia Stabs, MD;  Location: [facility];  Service: General;  Laterality: N/A;   EYE SURGERY     cataract   HEMOSTASIS CLIP PLACEMENT  10/28/2019   Procedure: HEMOSTASIS CLIP PLACEMENT;  Surgeon: Sergio Dandy, MD;  [facility]: [facility];  Service: Endoscopy;;   INNER EAR SURGERY Right 2006   Tumor removed    POLYPECTOMY  10/28/2019   Procedure: POLYPECTOMY;  Surgeon: Sergio Dandy, MD;  Location: [facility];  Service: Endoscopy;;   POLYPECTOMY  07/03/2020   Procedure: POLYPECTOMY;  Surgeon: Sergio Dandy, MD;  Location: [facility];  Service: Endoscopy;;   SUBMUCOSAL LIFTING INJECTION  10/28/2019   Procedure: SUBMUCOSAL LIFTING INJECTION;  Surgeon: Nandigam, Kavitha V, MD;  Location: [facility];  Service: Endoscopy;;   SUBMUCOSAL TATTOO INJECTION  10/28/2019   Procedure: SUBMUCOSAL TATTOO INJECTION;  Surgeon: Nandigam, Kavitha V, MD;  Location: [facility];  Service: Endoscopy;;   TONSILLECTOMY AND ADENOIDECTOMY  1950    OB History     Gravida  2   Para  2   Term  2   Preterm      AB      Living  2      SAB      IAB      Ectopic      Multiple      Live Births  2            Home Medications    Prior to Admission medications   Medication Sig Start Date End Date Taking? Authorizing Provider  Budeson-Glycopyrrol-Formoterol (BREZTRI AEROSPHERE IN) Inhale into the lungs. 2 puffs twice per day   Yes [provider]  calcium carbonate (TUMS - DOSED IN MG ELEMENTAL CALCIUM) 500 MG chewable tablet Chew 1 tablet by mouth as needed for indigestion or heartburn.   Yes [provider]  carboxymethylcellulose (REFRESH PLUS) 0.5 % SOLN Place 2 drops into both eyes 2 (two) times daily as needed (dry/irritated eyes).   Yes [provider]  cetirizine (ZYRTEC) 10 MG tablet Take 10  mg by mouth daily as needed for allergies.    Yes [provider]  chlorthalidone (HYGROTON) 25 MG tablet Take 25 mg by mouth every other day. 03/01/21  Yes [provider]  Cholecalciferol (VITAMIN D3) 50 MCG (2000 UT) TABS Take 2,000 Units by mouth daily.   Yes [provider]  Cyanocobalamin  (VITAMIN B12 PO) Take by mouth. daily   Yes [provider]  diltiazem  (CARDIZEM  CD) 120 MG 24 hr capsule TAKE 1 CAPSULE(120 MG) BY MOUTH DAILY 02/12/23  Yes Chandrasekhar, Mahesh A, MD  ezetimibe (ZETIA) 10 MG tablet Take 10 mg by mouth daily. 03/01/21  Yes [provider]  FOLIC ACID  PO Take by mouth.   Yes [provider]  MELATONIN PO Take 8 mg by mouth at bedtime.   Yes [provider]  Omega-3 Fatty Acids (FISH OIL ULTRA) 1400 MG CAPS Take 1,400 mg by mouth daily.   Yes [provider]  Probiotic Product (ALIGN) 4 MG CAPS Take 4 mg by mouth daily.   Yes [provider]  sulfaSALAzine  (AZULFIDINE ) 500 MG EC tablet Take 2 tablets (1,000 mg total) by mouth 2 (two) times daily. 09/03/23  Yes Graciella Lavender, PA  tiotropium (SPIRIVA) 18 MCG inhalation capsule Place 18 mcg into inhaler and inhale daily as needed (asthma).    Yes [provider]  VENTOLIN HFA 108 (90 BASE) MCG/ACT inhaler Inhale 1 puff into the lungs as needed for wheezing or shortness of breath.  11/04/14  Yes [provider]  Wheat Dextrin (BENEFIBER PO) Take 1 Dose by mouth daily.   Yes [provider]  zoledronic  acid (RECLAST ) 5 MG/100ML SOLN injection Inject 5 mg into the vein once. yearly   Yes [provider]  hydrocortisone  2.5 % cream as needed. 02/28/21   [provider]  pantoprazole  (PROTONIX ) 20 MG tablet Take 20 mg by mouth as needed for heartburn or indigestion.    [provider]    Family History Family History  Problem Relation Age of Onset   Lung cancer Mother        smoker but had  quit   Heart disease Father    Colon cancer Paternal Grandmother    Breast cancer Maternal Aunt    Esophageal cancer Neg Hx    Rectal cancer Neg Hx    Stomach cancer Neg Hx    Pancreatic cancer Neg Hx     Social History Social History   Tobacco Use   Smoking status: Former    Current packs/day: 0.00    Types: Cigarettes    Start date: 10/04/1977    Quit date: 10/05/2007    Years since quitting: 15.9   Smokeless tobacco: Never  Vaping Use   Vaping status: Never Used  Substance Use Topics   Alcohol  use: Yes    Comment: occasional wine   Drug use: No     Allergies   Cephalexin    Review of Systems Review of Systems  Per HPI  Physical Exam Triage Vital Signs ED Triage Vitals  Encounter Vitals Group     BP 09/17/23 1401 130/61     Systolic BP Percentile --      Diastolic BP Percentile --      Pulse Rate 09/17/23 1401 97     Resp 09/17/23 1401 18     Temp 09/17/23 1401 98 F (36.7 C)     Temp Source 09/17/23 1401 Oral     SpO2 09/17/23 1401 93 %     Weight --      Height --      Head Circumference --      Peak Flow --      Pain Score 09/17/23 1402 0     Pain Loc --      Pain Education --      Exclude from Growth Chart --    No data found.  Updated Vital Signs BP 130/61   Pulse 97   Temp 98 F (36.7 C) (Oral)   Resp 18   SpO2 93%   Visual Acuity Right Eye Distance:   Left Eye Distance:   Bilateral Distance:    Right Eye Near:   Left Eye Near:  Bilateral Near:     Physical Exam Vitals and nursing note reviewed.  Constitutional:      General: She is awake. She is not in acute distress.    Appearance: Normal appearance. She is well-developed and well-groomed. She is not ill-appearing.  Skin:    General: Skin is warm and dry.     Findings: Bruising, ecchymosis and wound present.     Comments: Approximately 6 cm x 3 cm avulsion laceration noted to the anterior aspect of mid right upper arm.  There is some mild bruising and ecchymosis  surrounding the wound.  Without swelling or purulent drainage from the wound.  Neurological:     Mental Status: She is alert.  Psychiatric:        Behavior: Behavior is cooperative.      UC Treatments / Results  Labs (all labs ordered are listed, but only abnormal results are displayed) Labs Reviewed - No data to display  EKG   Radiology No results found.  Procedures Procedures (including critical care time)  Medications Ordered in UC Medications - No data to display  Initial Impression / Assessment and Plan / UC Course  I have reviewed the triage vital signs and the nursing notes.  Pertinent labs & imaging results that were available during my care of the patient were reviewed by me and considered in my medical decision making (see chart for details).     Patient is well-appearing.  Vitals are stable.  There is an approximately 6 cm x 3 cm avulsion laceration noted to the anterior aspect of the mid right upper arm.  There is some mild bruising and ecchymosis surrounding the wound.  Without swelling, purulent drainage, or signs of infection.  Deferred antibiotic treatment at this time due to there being no signs of infection at this time.  Provided with wound care and dressing in clinic.  Discussed proper wound care while at home.  Discussed return precautions. Final Clinical Impressions(s) / UC Diagnoses   Final diagnoses:  Visit for wound check  Skin tear of right upper arm without complication, initial encounter     Discharge Instructions      Clean the area with mild soap and water. You can apply Neosporin or Bacitracin when doing dressing changes. Keep clean, dry, and covered. If you notice spreading of redness, swelling, or pus-like drainage from the wound return here for re-evaluation.  ED Prescriptions   None    PDMP not reviewed this encounter.   Levora Reas A, NP 09/17/23 1456

## 2023-10-12 ENCOUNTER — Ambulatory Visit (HOSPITAL_COMMUNITY)
Admission: EM | Admit: 2023-10-12 | Discharge: 2023-10-12 | Disposition: A | Discharge status: Other Acute Inpatient Hospital | Attending: Emergency Medicine | Admitting: Emergency Medicine

## 2023-10-12 ENCOUNTER — Emergency Department (HOSPITAL_COMMUNITY)

## 2023-10-12 ENCOUNTER — Encounter (HOSPITAL_COMMUNITY): Payer: Self-pay

## 2023-10-12 ENCOUNTER — Inpatient Hospital Stay (HOSPITAL_COMMUNITY)
Admission: EM | Admit: 2023-10-12 | Discharge: 2023-10-15 | DRG: 871 | Disposition: A | Discharge status: Home / Self Care | Attending: Internal Medicine | Admitting: Internal Medicine

## 2023-10-12 ENCOUNTER — Other Ambulatory Visit: Payer: Self-pay

## 2023-10-12 DIAGNOSIS — J449 Chronic obstructive pulmonary disease, unspecified: Secondary | ICD-10-CM

## 2023-10-12 DIAGNOSIS — E785 Hyperlipidemia, unspecified: Secondary | ICD-10-CM | POA: Diagnosis not present

## 2023-10-12 DIAGNOSIS — I471 Supraventricular tachycardia, unspecified: Secondary | ICD-10-CM

## 2023-10-12 DIAGNOSIS — A419 Sepsis, unspecified organism: Secondary | ICD-10-CM | POA: Diagnosis present

## 2023-10-12 DIAGNOSIS — Z8249 Family history of ischemic heart disease and other diseases of the circulatory system: Secondary | ICD-10-CM

## 2023-10-12 DIAGNOSIS — Z7951 Long term (current) use of inhaled steroids: Secondary | ICD-10-CM

## 2023-10-12 DIAGNOSIS — R918 Other nonspecific abnormal finding of lung field: Secondary | ICD-10-CM | POA: Diagnosis not present

## 2023-10-12 DIAGNOSIS — Z801 Family history of malignant neoplasm of trachea, bronchus and lung: Secondary | ICD-10-CM

## 2023-10-12 DIAGNOSIS — R000 Tachycardia, unspecified: Secondary | ICD-10-CM | POA: Diagnosis not present

## 2023-10-12 DIAGNOSIS — R079 Chest pain, unspecified: Secondary | ICD-10-CM | POA: Diagnosis not present

## 2023-10-12 DIAGNOSIS — J440 Chronic obstructive pulmonary disease with acute lower respiratory infection: Secondary | ICD-10-CM | POA: Diagnosis present

## 2023-10-12 DIAGNOSIS — I4719 Other supraventricular tachycardia: Secondary | ICD-10-CM | POA: Diagnosis present

## 2023-10-12 DIAGNOSIS — J432 Centrilobular emphysema: Secondary | ICD-10-CM | POA: Diagnosis not present

## 2023-10-12 DIAGNOSIS — J189 Pneumonia, unspecified organism: Secondary | ICD-10-CM | POA: Diagnosis not present

## 2023-10-12 DIAGNOSIS — R0789 Other chest pain: Secondary | ICD-10-CM | POA: Diagnosis not present

## 2023-10-12 DIAGNOSIS — Z87891 Personal history of nicotine dependence: Secondary | ICD-10-CM

## 2023-10-12 DIAGNOSIS — Z8719 Personal history of other diseases of the digestive system: Secondary | ICD-10-CM

## 2023-10-12 DIAGNOSIS — R0602 Shortness of breath: Secondary | ICD-10-CM | POA: Diagnosis not present

## 2023-10-12 DIAGNOSIS — J9601 Acute respiratory failure with hypoxia: Secondary | ICD-10-CM | POA: Diagnosis present

## 2023-10-12 DIAGNOSIS — K5120 Ulcerative (chronic) proctitis without complications: Secondary | ICD-10-CM | POA: Diagnosis not present

## 2023-10-12 DIAGNOSIS — I10 Essential (primary) hypertension: Secondary | ICD-10-CM | POA: Diagnosis not present

## 2023-10-12 DIAGNOSIS — R911 Solitary pulmonary nodule: Secondary | ICD-10-CM | POA: Diagnosis not present

## 2023-10-12 DIAGNOSIS — Z881 Allergy status to other antibiotic agents status: Secondary | ICD-10-CM

## 2023-10-12 DIAGNOSIS — I2510 Atherosclerotic heart disease of native coronary artery without angina pectoris: Secondary | ICD-10-CM | POA: Diagnosis not present

## 2023-10-12 DIAGNOSIS — Z1152 Encounter for screening for COVID-19: Secondary | ICD-10-CM | POA: Diagnosis not present

## 2023-10-12 DIAGNOSIS — K219 Gastro-esophageal reflux disease without esophagitis: Secondary | ICD-10-CM | POA: Diagnosis present

## 2023-10-12 DIAGNOSIS — Z79899 Other long term (current) drug therapy: Secondary | ICD-10-CM

## 2023-10-12 DIAGNOSIS — Z800 Family history of malignant neoplasm of digestive organs: Secondary | ICD-10-CM

## 2023-10-12 DIAGNOSIS — Z85828 Personal history of other malignant neoplasm of skin: Secondary | ICD-10-CM | POA: Diagnosis not present

## 2023-10-12 DIAGNOSIS — I700 Atherosclerosis of aorta: Secondary | ICD-10-CM | POA: Diagnosis not present

## 2023-10-12 DIAGNOSIS — J439 Emphysema, unspecified: Secondary | ICD-10-CM | POA: Diagnosis not present

## 2023-10-12 DIAGNOSIS — M545 Low back pain, unspecified: Secondary | ICD-10-CM | POA: Diagnosis not present

## 2023-10-12 DIAGNOSIS — R0902 Hypoxemia: Secondary | ICD-10-CM

## 2023-10-12 DIAGNOSIS — Z803 Family history of malignant neoplasm of breast: Secondary | ICD-10-CM | POA: Diagnosis not present

## 2023-10-12 DIAGNOSIS — R0603 Acute respiratory distress: Secondary | ICD-10-CM | POA: Diagnosis not present

## 2023-10-12 LAB — LACTIC ACID, PLASMA
Lactic Acid, Venous: 1.4 mmol/L (ref 0.5–1.9)
Lactic Acid, Venous: 1.2 mmol/L (ref 0.5–1.9)

## 2023-10-12 LAB — URINALYSIS, W/ REFLEX TO CULTURE (INFECTION SUSPECTED)
Bacteria, UA: NONE SEEN
Bilirubin Urine: NEGATIVE
Glucose, UA: NEGATIVE mg/dL
Ketones, ur: 20 mg/dL — AB
Leukocytes,Ua: NEGATIVE
Nitrite: NEGATIVE
Protein, ur: 30 mg/dL — AB
RBC / HPF: >50 RBC/hpf (ref 0–5)
Specific Gravity, Urine: >1.046 — ABNORMAL HIGH (ref 1.005–1.030)
pH: 6 (ref 5.0–8.0)

## 2023-10-12 LAB — BASIC METABOLIC PANEL WITH GFR
Anion gap: 12 (ref 5–15)
BUN: 9 mg/dL (ref 8–23)
CO2: 27 mmol/L (ref 22–32)
Calcium: 9.1 mg/dL (ref 8.9–10.3)
Chloride: 97 mmol/L — ABNORMAL LOW (ref 98–111)
Creatinine, Ser: 0.73 mg/dL (ref 0.44–1.00)
GFR, Estimated: >60 mL/min (ref >60)
Glucose, Bld: 118 mg/dL — ABNORMAL HIGH (ref 70–99)
Potassium: 3.5 mmol/L (ref 3.5–5.1)
Sodium: 136 mmol/L (ref 135–145)

## 2023-10-12 LAB — RESPIRATORY PANEL BY PCR

## 2023-10-12 LAB — CBC
HCT: 42.1 % (ref 36.0–46.0)
Hemoglobin: 13.6 g/dL (ref 12.0–15.0)
MCH: 29.9 pg (ref 26.0–34.0)
MCHC: 32.3 g/dL (ref 30.0–36.0)
MCV: 92.5 fL (ref 80.0–100.0)
Platelets: 123 10*3/uL — ABNORMAL LOW (ref 150–400)
RBC: 4.55 MIL/uL (ref 3.87–5.11)
RDW: 12.8 % (ref 11.5–15.5)
WBC: 14.5 10*3/uL — ABNORMAL HIGH (ref 4.0–10.5)
nRBC: 0 % (ref 0.0–0.2)

## 2023-10-12 LAB — PROCALCITONIN: Procalcitonin: <0.1 ng/mL

## 2023-10-12 LAB — D-DIMER, QUANTITATIVE: D-Dimer, Quant: 0.33 ug{FEU}/mL (ref 0.00–0.50)

## 2023-10-12 LAB — TROPONIN I (HIGH SENSITIVITY)
Troponin I (High Sensitivity): 10 ng/L (ref <18)
Troponin I (High Sensitivity): 11 ng/L (ref <18)

## 2023-10-12 LAB — SARS CORONAVIRUS 2 BY RT PCR: SARS Coronavirus 2 by RT PCR: NEGATIVE

## 2023-10-12 MED ORDER — EZETIMIBE 10 MG PO TABS
10.0000 mg | ORAL_TABLET | Freq: Every morning | ORAL | Status: DC
  Administered 2023-10-13 – 2023-10-15 (×3): 10 mg via ORAL
  Filled 2023-10-12 (×3): qty 1

## 2023-10-12 MED ORDER — IPRATROPIUM-ALBUTEROL 0.5-2.5 (3) MG/3ML IN SOLN: 3.0000 mL | Freq: Four times a day (QID) | RESPIRATORY_TRACT | Status: DC

## 2023-10-12 MED ORDER — IPRATROPIUM-ALBUTEROL 0.5-2.5 (3) MG/3ML IN SOLN: 3.0000 mL | RESPIRATORY_TRACT | Status: DC | PRN

## 2023-10-12 MED ORDER — IPRATROPIUM-ALBUTEROL 0.5-2.5 (3) MG/3ML IN SOLN
3.0000 mL | Freq: Once | RESPIRATORY_TRACT | Status: AC
  Administered 2023-10-12: 3 mL via RESPIRATORY_TRACT
  Filled 2023-10-12: qty 3

## 2023-10-12 MED ORDER — ACETAMINOPHEN 325 MG PO TABS: 650.0000 mg | ORAL_TABLET | Freq: Four times a day (QID) | ORAL | Status: DC | PRN

## 2023-10-12 MED ORDER — IOHEXOL 350 MG/ML SOLN
75.0000 mL | Freq: Once | INTRAVENOUS | Status: AC | PRN
  Administered 2023-10-12: 75 mL via INTRAVENOUS

## 2023-10-12 MED ORDER — ONDANSETRON HCL 4 MG PO TABS: 4.0000 mg | ORAL_TABLET | Freq: Four times a day (QID) | ORAL | Status: DC | PRN

## 2023-10-12 MED ORDER — ACETAMINOPHEN 650 MG RE SUPP: 650.0000 mg | Freq: Four times a day (QID) | RECTAL | Status: DC | PRN

## 2023-10-12 MED ORDER — LEVOFLOXACIN IN D5W 750 MG/150ML IV SOLN
750.0000 mg | INTRAVENOUS | Status: DC
  Filled 2023-10-12: qty 150

## 2023-10-12 MED ORDER — ONDANSETRON HCL 4 MG/2ML IJ SOLN: 4.0000 mg | Freq: Four times a day (QID) | INTRAMUSCULAR | Status: DC | PRN

## 2023-10-12 MED ORDER — BUDESON-GLYCOPYRROL-FORMOTEROL 160-9-4.8 MCG/ACT IN AERO
2.0000 | INHALATION_SPRAY | Freq: Two times a day (BID) | RESPIRATORY_TRACT | Status: DC
  Administered 2023-10-12 – 2023-10-15 (×5): 2 via RESPIRATORY_TRACT
  Filled 2023-10-12: qty 5.9

## 2023-10-12 MED ORDER — ASPIRIN 81 MG PO CHEW
324.0000 mg | CHEWABLE_TABLET | Freq: Once | ORAL | Status: AC
  Administered 2023-10-12: 324 mg via ORAL
  Filled 2023-10-12: qty 4

## 2023-10-12 MED ORDER — SODIUM CHLORIDE 0.9% FLUSH
3.0000 mL | Freq: Two times a day (BID) | INTRAVENOUS | Status: DC
  Administered 2023-10-12 – 2023-10-15 (×6): 3 mL via INTRAVENOUS

## 2023-10-12 MED ORDER — POLYVINYL ALCOHOL 1.4 % OP SOLN: 1.0000 [drp] | OPHTHALMIC | Status: DC | PRN

## 2023-10-12 MED ORDER — CHLORTHALIDONE 25 MG PO TABS
25.0000 mg | ORAL_TABLET | ORAL | Status: DC
  Administered 2023-10-12 – 2023-10-14 (×2): 25 mg via ORAL
  Filled 2023-10-12 (×3): qty 1

## 2023-10-12 MED ORDER — SULFASALAZINE 500 MG PO TBEC
1000.0000 mg | DELAYED_RELEASE_TABLET | Freq: Every day | ORAL | Status: DC
  Administered 2023-10-12 – 2023-10-14 (×3): 1000 mg via ORAL
  Filled 2023-10-12 (×4): qty 2

## 2023-10-12 MED ORDER — DILTIAZEM HCL ER COATED BEADS 120 MG PO CP24
120.0000 mg | ORAL_CAPSULE | Freq: Every day | ORAL | Status: DC
  Administered 2023-10-12 – 2023-10-14 (×3): 120 mg via ORAL
  Filled 2023-10-12 (×4): qty 1

## 2023-10-12 MED ORDER — ENOXAPARIN SODIUM 40 MG/0.4ML IJ SOSY
40.0000 mg | PREFILLED_SYRINGE | INTRAMUSCULAR | Status: DC
  Administered 2023-10-12 – 2023-10-14 (×3): 40 mg via SUBCUTANEOUS
  Filled 2023-10-12 (×3): qty 0.4

## 2023-10-12 MED ORDER — FOLIC ACID 1 MG PO TABS
1.0000 mg | ORAL_TABLET | Freq: Every morning | ORAL | Status: DC
  Administered 2023-10-13 – 2023-10-15 (×3): 1 mg via ORAL
  Filled 2023-10-12 (×3): qty 1

## 2023-10-12 MED ORDER — LEVOFLOXACIN IN D5W 750 MG/150ML IV SOLN
750.0000 mg | Freq: Once | INTRAVENOUS | Status: AC
  Administered 2023-10-12: 750 mg via INTRAVENOUS
  Filled 2023-10-12: qty 150

## 2023-10-12 MED ORDER — GUAIFENESIN ER 600 MG PO TB12
600.0000 mg | ORAL_TABLET | Freq: Two times a day (BID) | ORAL | Status: DC
  Administered 2023-10-12 – 2023-10-15 (×6): 600 mg via ORAL
  Filled 2023-10-12 (×6): qty 1

## 2023-10-12 MED ORDER — NUTRISOURCE FIBER PO PACK
1.0000 | PACK | Freq: Every morning | ORAL | Status: DC
  Administered 2023-10-13 – 2023-10-15 (×3): 1 via ORAL
  Filled 2023-10-12 (×4): qty 1

## 2023-10-12 MED ORDER — MELATONIN 5 MG PO TABS
8.0000 mg | ORAL_TABLET | Freq: Every day | ORAL | Status: DC
  Administered 2023-10-12 – 2023-10-14 (×3): 8 mg via ORAL
  Filled 2023-10-12 (×3): qty 1

## 2023-10-12 MED ORDER — CARBOXYMETHYLCELLULOSE SODIUM 0.5 % OP SOLN: 2.0000 [drp] | Freq: Two times a day (BID) | OPHTHALMIC | Status: DC | PRN

## 2023-10-12 MED ORDER — SODIUM CHLORIDE 0.9 % IV BOLUS
500.0000 mL | Freq: Once | INTRAVENOUS | Status: AC
  Administered 2023-10-12: 500 mL via INTRAVENOUS

## 2023-10-12 NOTE — ED Triage Notes (Signed)
 Presenting with sharp right side chest pain, SOB, sore throat, and nausea onset last night. States did have an arm injury recently.

## 2023-10-12 NOTE — ED Provider Notes (Signed)
 [place] EMERGENCY DEPARTMENT AT [facility] Provider Note   CSN: [id number] Arrival date & time: 10/12/23  1132     History  Chief Complaint  Patient presents with   Chest Pain    Ashley Rivera is a 83 y.o. female.   Chest Pain    Patient has a history of COPD acid reflux hypertension who presents to the ED with complaints of chest pain.  Patient states she did started noticing symptoms last night the pain is primarily in the right side of her chest.  It does go to her back.  Patient states pain seems to come and go.  It does not last long.  She notices it particularly gets worse if she tries to take a deep breath.  She has not had any fevers or chills.  Just an occasional cough.  No leg swelling.  Patient went to an urgent care and was sent to the ED for further evaluation  Home Medications Prior to Admission medications   Medication Sig Start Date End Date Taking? Authorizing Provider  Budeson-Glycopyrrol-Formoterol (BREZTRI AEROSPHERE IN) Inhale into the lungs. 2 puffs twice per day    [provider]  calcium carbonate (TUMS - DOSED IN MG ELEMENTAL CALCIUM) 500 MG chewable tablet Chew 1 tablet by mouth as needed for indigestion or heartburn.    [provider]  carboxymethylcellulose (REFRESH PLUS) 0.5 % SOLN Place 2 drops into both eyes 2 (two) times daily as needed (dry/irritated eyes).    [provider]  cetirizine (ZYRTEC) 10 MG tablet Take 10 mg by mouth daily as needed for allergies.     [provider]  chlorthalidone (HYGROTON) 25 MG tablet Take 25 mg by mouth every other day. 03/01/21   [provider]  Cholecalciferol (VITAMIN D3) 50 MCG (2000 UT) TABS Take 2,000 Units by mouth daily.    [provider]  Cyanocobalamin  (VITAMIN B12 PO) Take by mouth. daily    [provider]  diltiazem  (CARDIZEM  CD) 120 MG 24 hr capsule TAKE 1 CAPSULE(120 MG) BY MOUTH DAILY 02/12/23   Chandrasekhar, Mahesh A, MD   ezetimibe (ZETIA) 10 MG tablet Take 10 mg by mouth daily. 03/01/21   [provider]  FOLIC ACID  PO Take by mouth.    [provider]  hydrocortisone  2.5 % cream as needed. 02/28/21   [provider]  MELATONIN PO Take 8 mg by mouth at bedtime.    [provider]  Omega-3 Fatty Acids (FISH OIL ULTRA) 1400 MG CAPS Take 1,400 mg by mouth daily.    [provider]  pantoprazole  (PROTONIX ) 20 MG tablet Take 20 mg by mouth as needed for heartburn or indigestion.    [provider]  Probiotic Product (ALIGN) 4 MG CAPS Take 4 mg by mouth daily.    [provider]  sulfaSALAzine  (AZULFIDINE ) 500 MG EC tablet Take 2 tablets (1,000 mg total) by mouth 2 (two) times daily. 09/03/23   Graciella Lavender, PA  tiotropium (SPIRIVA) 18 MCG inhalation capsule Place 18 mcg into inhaler and inhale daily as needed (asthma).     [provider]  VENTOLIN HFA 108 (90 BASE) MCG/ACT inhaler Inhale 1 puff into the lungs as needed for wheezing or shortness of breath.  11/04/14   [provider]  Wheat Dextrin (BENEFIBER PO) Take 1 Dose by mouth daily.    [provider]  zoledronic  acid (RECLAST ) 5 MG/100ML SOLN injection Inject 5 mg into the vein  once. yearly    [provider]      Allergies    Cephalexin     Review of Systems   Review of Systems  Cardiovascular:  Positive for chest pain.    Physical Exam Updated Vital Signs BP (!) 139/112 (BP Location: Right Wrist)   Pulse (!) 109   Temp 99.3 F (37.4 C) (Temporal)   Resp 20   SpO2 100%  Physical Exam Vitals and nursing note reviewed.  Constitutional:      General: She is not in acute distress.    Appearance: She is well-developed.  HENT:     Head: Normocephalic and atraumatic.     Right Ear: External ear normal.     Left Ear: External ear normal.  Eyes:     General: No scleral icterus.       Right eye: No discharge.        Left eye: No discharge.      Conjunctiva/sclera: Conjunctivae normal.  Neck:     Trachea: No tracheal deviation.  Cardiovascular:     Rate and Rhythm: Normal rate and regular rhythm.  Pulmonary:     Effort: Pulmonary effort is normal. No respiratory distress.     Breath sounds: No stridor. Decreased breath sounds present. No wheezing or rales.  Abdominal:     General: Bowel sounds are normal. There is no distension.     Palpations: Abdomen is soft.     Tenderness: There is no abdominal tenderness. There is no guarding or rebound.  Musculoskeletal:        General: No tenderness or deformity.     Cervical back: Neck supple.     Right lower leg: No edema.     Left lower leg: No edema.  Skin:    General: Skin is warm and dry.     Findings: No rash.  Neurological:     General: No focal deficit present.     Mental Status: She is alert.     Cranial Nerves: No cranial nerve deficit, dysarthria or facial asymmetry.     Sensory: No sensory deficit.     Motor: No abnormal muscle tone or seizure activity.     Coordination: Coordination normal.  Psychiatric:        Mood and Affect: Mood normal.     ED Results / Procedures / Treatments   Labs (all labs ordered are listed, but only abnormal results are displayed) Labs Reviewed  BASIC METABOLIC PANEL WITH GFR  CBC  D-DIMER, QUANTITATIVE  TROPONIN I (HIGH SENSITIVITY)    EKG EKG Interpretation Date/Time:  Sunday October 12 2023 11:39:42 EDT Ventricular Rate:  108 PR Interval:  153 QRS Duration:  115 QT Interval:  346 QTC Calculation: 462 R Axis:   64  Text Interpretation: Sinus tachycardia Probable left atrial enlargement Nonspecific intraventricular conduction delay Probable anteroseptal infarct, old Repol abnrm suggests ischemia, lateral leads Confirmed by Trish Furl [phone]) on 10/12/2023 11:44:05 AM  Radiology No results found.  Procedures Procedures    Medications Ordered in ED Medications  aspirin chewable tablet 324 mg (has no administration  in time range)  sodium chloride  0.9 % bolus 500 mL (has no administration in time range)    ED Course/ Medical Decision Making/ A&P Clinical Course as of 10/12/23 1603  Sun Oct 12, 2023  1319 D-dimer, quantitative D-dimer normal.  Troponin normal [JK]  1319 Chest x-ray without acute findings [JK]  1336 CBC(!) CBC shows leukocytosis.  Metabolic panel unremarkable [JK]  1431  D-dimer, quantitative [JK]  1451 Patient has borderline O2 sat off supplemental oxygen.  Will proceed with CT angio to rule out PE despite negative D-dimer.  Occult pneumonia is also concern [JK]  1601 CTA chest shows no PE.  Perhaps small infiltrate in the right upper lobe.  Infiltrate versus nodule.  Will recommend follow-up CT in 3 to 6 months.  Remained stable on room air.  Will ambulate to see if she has any recurrent tachycardia or hypoxia with walking [MP]    Clinical Course User Index [JK] Trish Furl, MD [MP] Sallyanne Creamer, DO                                 Medical Decision Making Amount and/or Complexity of Data Reviewed Labs: ordered. Decision-making details documented in ED Course. Radiology: ordered and independent interpretation performed.  Risk OTC drugs. Prescription drug management.   Patient presented to ED for evaluation of tachycardia and pleuritic chest pain.  Patient has history of COPD but is not chronically on oxygen at home.  There is also report of O2 requirement at urgent care.  In the ED patient did not have a leukocytosis.  No signs of cardiac injury with normal troponin.  EKG did not show any signs of SVT or atrial fibrillation.  I did see a sinus tachycardia though.  I also referred the urgent care EKG and that also appeared to be a sinus tachycardia  Patient continues to have pleuritic chest pain.  He noted that her oxygen saturation was borderline low.  CT scan ordered to evaluate for possible PE as well as occult pneumonia.  Care turned over to Dr Antoniette Batty at shift change  pending CT result        Final Clinical Impression(s) / ED Diagnoses Final diagnoses:  None    Rx / DC Orders ED Discharge Orders     None         Trish Furl, MD 10/12/23 [phone]

## 2023-10-12 NOTE — ED Triage Notes (Signed)
 Pt BIB [facility] from home an UC where the pt was found to be in SVT. Upon [facility] arrival , pt had already self converted to [facility] . A&O x4. Denies cp at this time.

## 2023-10-12 NOTE — ED Notes (Signed)
 [facility] CALLED FOR EMERGENT TRANSPORT AND STATES WILL SEND A TRUCK.   CALLED [facility] AND THEY WERE INFORMED OF PATIENT INCOMING.

## 2023-10-12 NOTE — ED Notes (Signed)
 Pt informed we need a urine sample. Pt does not need to urinate at this time

## 2023-10-12 NOTE — ED Provider Notes (Signed)
 [facility]    CSN: [id number] Arrival date & time: 10/12/23  1038      History   Chief Complaint Chief Complaint  Patient presents with   Chest Pain   Shortness of Breath    HPI Ashley Rivera is a 83 y.o. female.  Last night developed sharp chest pain with shortness of breath, and nausea. Chest pain is located right side. She denies palpations or fast heart rate at this time  History of COPD, SVT, HTN  Does not wear oxygen at baseline  On diltiazem    Past Medical History:  Diagnosis Date   Allergy    Asthma    bronchial asthma   Cataract    left eye   Complication of anesthesia    Became congested after colonoscopy   COPD (chronic obstructive pulmonary disease) (HCC)    Diverticulosis    Emphysema of lung (HCC)    Genital warts    Dx about 30 years ago    GERD (gastroesophageal reflux disease)    Hemorrhoids    History of hiatal hernia    History of kidney stones    Hx of colonic polyp    Hx: UTI (urinary tract infection)    Hypertension    Osteoporosis    Pneumonia    Proctitis 11/12/2011   Skin cancer    basal cell   Urinary incontinence    On occasion    Patient Active Problem List   Diagnosis Date Noted   PAC (premature atrial contraction) 11/26/2021   Paroxysmal SVT (supraventricular tachycardia) (HCC) 11/26/2021   Frequent PVCs 06/20/2021   Unspecified open wound, right lower leg, initial encounter 04/23/2021   Hardening of the aorta (main artery of the heart) (HCC) 04/23/2021   Essential hypertension 04/23/2021   Anemia 04/23/2021   Meningioma (HCC) 07/10/2020   Polyp of rectum    Chronic obstructive pulmonary disease (HCC) 01/28/2020   Coronary artery calcification 01/28/2020   SOB (shortness of breath) 01/28/2020   Polyp of cecum    Non-thrombocytopenic purpura (HCC) 11/11/2015   Microscopic hematuria 10/26/2015   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   Diaphragmatic hernia 03/03/2015   GERD (gastroesophageal reflux disease)  01/17/2015   Vitamin D deficiency 10/31/2014   Peripheral venous insufficiency 09/16/2012   Pruritus ani 12/04/2011   Proctitis 12/04/2011   History of colonic polyps 10/22/2011   Internal hemorrhoids 10/22/2011   Hyperlipidemia 02/04/2009   Age-related osteoporosis without current pathological fracture 02/04/2009    Past Surgical History:  Procedure Laterality Date   BIOPSY  07/03/2020   Procedure: BIOPSY;  Surgeon: Sergio Dandy, MD;  Location: [facility];  Service: Endoscopy;;   COLONOSCOPY     COLONOSCOPY WITH PROPOFOL  N/A 10/28/2019   Procedure: COLONOSCOPY WITH PROPOFOL   LARGE CECAL POLYP;  Surgeon: Nandigam, Kavitha V, MD;  Location: [facility];  Service: Endoscopy;  Laterality: N/A;   COLONOSCOPY WITH PROPOFOL  N/A 07/03/2020   Procedure: COLONOSCOPY WITH PROPOFOL ;  Surgeon: Sergio Dandy, MD;  Location: [facility];  Service: Endoscopy;  Laterality: N/A;   CYSTOSCOPY/URETEROSCOPY/HOLMIUM LASER/STENT PLACEMENT Right 11/09/2019   Procedure: CYSTOSCOPY/URETEROSCOPY/HOLMIUM LASER/STENT PLACEMENT;  Surgeon: Christina Coyer, MD;  Location: [facility];  Service: Urology;  Laterality: Right;   DILATION AND CURETTAGE OF UTERUS     50 years ago. Removed rest of placenta from birth   ENDOSCOPIC MUCOSAL RESECTION N/A 10/28/2019   Procedure: ENDOSCOPIC MUCOSAL RESECTION;  Surgeon: Sergio Dandy, MD;  Location: [facility];  Service: Endoscopy;  Laterality: N/A;  EVALUATION UNDER ANESTHESIA WITH HEMORRHOIDECTOMY N/A 11/28/2021   Procedure: EXAM UNDER ANESTHESIA WITH HEMORRHOIDECTOMY 1 VS 2 COLUMN;  Surgeon: Melvenia Stabs, MD;  Location: [facility];  Service: General;  Laterality: N/A;   EYE SURGERY     cataract   HEMOSTASIS CLIP PLACEMENT  10/28/2019   Procedure: HEMOSTASIS CLIP PLACEMENT;  Surgeon: Sergio Dandy, MD;  Location: [facility];  Service: Endoscopy;;   INNER EAR SURGERY Right 2006   Tumor removed    POLYPECTOMY  10/28/2019    Procedure: POLYPECTOMY;  Surgeon: Sergio Dandy, MD;  Location: [facility];  Service: Endoscopy;;   POLYPECTOMY  07/03/2020   Procedure: POLYPECTOMY;  Surgeon: Sergio Dandy, MD;  Location: [facility];  Service: Endoscopy;;   SUBMUCOSAL LIFTING INJECTION  10/28/2019   Procedure: SUBMUCOSAL LIFTING INJECTION;  Surgeon: Nandigam, Kavitha V, MD;  Location: [facility];  Service: Endoscopy;;   SUBMUCOSAL TATTOO INJECTION  10/28/2019   Procedure: SUBMUCOSAL TATTOO INJECTION;  Surgeon: Nandigam, Kavitha V, MD;  Location: [facility];  Service: Endoscopy;;   TONSILLECTOMY AND ADENOIDECTOMY  1950    OB History     Gravida  2   Para  2   Term  2   Preterm      AB      Living  2      SAB      IAB      Ectopic      Multiple      Live Births  2            Home Medications    Prior to Admission medications   Medication Sig Start Date End Date Taking? Authorizing Provider  Budeson-Glycopyrrol-Formoterol (BREZTRI AEROSPHERE IN) Inhale into the lungs. 2 puffs twice per day    [provider]  calcium carbonate (TUMS - DOSED IN MG ELEMENTAL CALCIUM) 500 MG chewable tablet Chew 1 tablet by mouth as needed for indigestion or heartburn.    [provider]  carboxymethylcellulose (REFRESH PLUS) 0.5 % SOLN Place 2 drops into both eyes 2 (two) times daily as needed (dry/irritated eyes).    [provider]  cetirizine (ZYRTEC) 10 MG tablet Take 10 mg by mouth daily as needed for allergies.     [provider]  chlorthalidone (HYGROTON) 25 MG tablet Take 25 mg by mouth every other day. 03/01/21   [provider]  Cholecalciferol (VITAMIN D3) 50 MCG (2000 UT) TABS Take 2,000 Units by mouth daily.    [provider]  Cyanocobalamin  (VITAMIN B12 PO) Take by mouth. daily    [provider]  diltiazem  (CARDIZEM  CD) 120 MG 24 hr capsule TAKE 1 CAPSULE(120 MG) BY MOUTH DAILY 02/12/23   Chandrasekhar, Mahesh A, MD   ezetimibe (ZETIA) 10 MG tablet Take 10 mg by mouth daily. 03/01/21   [provider]  FOLIC ACID  PO Take by mouth.    [provider]  hydrocortisone  2.5 % cream as needed. 02/28/21   [provider]  MELATONIN PO Take 8 mg by mouth at bedtime.    [provider]  Omega-3 Fatty Acids (FISH OIL ULTRA) 1400 MG CAPS Take 1,400 mg by mouth daily.    [provider]  pantoprazole  (PROTONIX ) 20 MG tablet Take 20 mg by mouth as needed for heartburn or indigestion.    [provider]  Probiotic Product (ALIGN) 4 MG CAPS Take 4 mg by mouth daily.    [provider]  sulfaSALAzine  (AZULFIDINE ) 500  MG EC tablet Take 2 tablets (1,000 mg total) by mouth 2 (two) times daily. 09/03/23   Graciella Lavender, PA  tiotropium (SPIRIVA) 18 MCG inhalation capsule Place 18 mcg into inhaler and inhale daily as needed (asthma).     [provider]  VENTOLIN HFA 108 (90 BASE) MCG/ACT inhaler Inhale 1 puff into the lungs as needed for wheezing or shortness of breath.  11/04/14   [provider]  Wheat Dextrin (BENEFIBER PO) Take 1 Dose by mouth daily.    [provider]  zoledronic  acid (RECLAST ) 5 MG/100ML SOLN injection Inject 5 mg into the vein once. yearly    [provider]    Family History Family History  Problem Relation Age of Onset   Lung cancer Mother        smoker but had quit   Heart disease Father    Colon cancer Paternal Grandmother    Breast cancer Maternal Aunt    Esophageal cancer Neg Hx    Rectal cancer Neg Hx    Stomach cancer Neg Hx    Pancreatic cancer Neg Hx     Social History Social History   Tobacco Use   Smoking status: Former    Current packs/day: 0.00    Types: Cigarettes    Start date: 10/04/1977    Quit date: 10/05/2007    Years since quitting: 16.0   Smokeless tobacco: Never  Vaping Use   Vaping status: Never Used  Substance Use Topics   Alcohol  use: Yes    Comment:  occasional wine   Drug use: No     Allergies   Cephalexin    Review of Systems Review of Systems  Respiratory:  Positive for shortness of breath.   Cardiovascular:  Positive for chest pain.   As per HPI  Physical Exam Triage Vital Signs ED Triage Vitals [10/12/23 1046]  Encounter Vitals Group     BP 136/60     Systolic BP Percentile      Diastolic BP Percentile      Pulse Rate (!) 114     Resp (!) 22     Temp 98.1 F (36.7 C)     Temp Source Oral     SpO2 (!) 85 %     Weight      Height      Head Circumference      Peak Flow      Pain Score      Pain Loc      Pain Education      Exclude from Growth Chart    No data found.  Updated Vital Signs BP 136/60 (BP Location: Left Arm)   Pulse (!) 114   Temp 98.1 F (36.7 C) (Oral)   Resp (!) 22   SpO2 94%    Physical Exam Vitals and nursing note reviewed.  Constitutional:      General: She is not in acute distress.    Appearance: Normal appearance.  HENT:     Mouth/Throat:     Pharynx: Oropharynx is clear.  Cardiovascular:     Rate and Rhythm: Tachycardia present.     Pulses:          Radial pulses are 2+ on the right side and 2+ on the left side.  Pulmonary:     Effort: Tachypnea present. No respiratory distress.     Breath sounds: Decreased breath sounds present.  Abdominal:     Palpations: Abdomen is soft.  Skin:  General: Skin is warm and dry.  Neurological:     Mental Status: She is alert and oriented to person, place, and time.     UC Treatments / Results  Labs (all labs ordered are listed, but only abnormal results are displayed) Labs Reviewed - No data to display  EKG  Radiology No results found.  Procedures Procedures   Medications Ordered in UC Medications - No data to display  Initial Impression / Assessment and Plan / UC Course  I have reviewed the triage vital signs and the nursing notes.  Pertinent labs & imaging results that were available during my care of the patient  were reviewed by me and considered in my medical decision making (see chart for details).  Patient is afebrile, normotensive  Tachycardic, tachypneic, and hypoxic 85% room air Placed on 4L [place], oxygen up to 94%  EKG is monitored by this provider, with heart rate fluctuating from 130-250 bpm. Patient likely in SVT.  [facility] is called for transport to ED. Patient requires higher level of care.  18G placed in left [facility]  Final Clinical Impressions(s) / UC Diagnoses   Final diagnoses:  SVT (supraventricular tachycardia) (HCC)  Chest pain, unspecified type  Shortness of breath  Hypoxia   Discharge Instructions   None    ED Prescriptions   None    PDMP not reviewed this encounter.   Creighton Doffing, [place] 10/12/23 1130

## 2023-10-12 NOTE — ED Provider Notes (Signed)
  Physical Exam  BP (!) 149/80 (BP Location: Right Wrist)   Pulse (!) 103   Temp 97.7 F (36.5 C) (Temporal)   Resp 18   SpO2 96%   Physical Exam Vitals and nursing note reviewed.  HENT:     Head: Normocephalic and atraumatic.  Eyes:     Pupils: Pupils are equal, round, and reactive to light.  Cardiovascular:     Rate and Rhythm: Normal rate and regular rhythm.  Pulmonary:     Effort: Pulmonary effort is normal.     Breath sounds: Normal breath sounds.  Abdominal:     Palpations: Abdomen is soft.     Tenderness: There is no abdominal tenderness.  Skin:    General: Skin is warm and dry.  Neurological:     Mental Status: She is alert.  Psychiatric:        Mood and Affect: Mood normal.     Procedures  Procedures  ED Course / MDM   Clinical Course as of 10/12/23 1621  Sun Oct 12, 2023  1319 D-dimer, quantitative D-dimer normal.  Troponin normal [JK]  1319 Chest x-ray without acute findings [JK]  1336 CBC(!) CBC shows leukocytosis.  Metabolic panel unremarkable [JK]  1431 D-dimer, quantitative [JK]  1451 Patient has borderline O2 sat off supplemental oxygen.  Will proceed with CT angio to rule out PE despite negative D-dimer.  Occult pneumonia is also concern [JK]  1601 CTA chest shows no PE.  Perhaps small infiltrate in the right upper lobe.  Infiltrate versus nodule.  Will recommend follow-up CT in 3 to 6 months.  Remained stable on room air.  Will ambulate to see if she has any recurrent tachycardia or hypoxia with walking [MP]  1608 Hypoxemia to 87% while ambulating.  No home O2 at baseline.  Will give a dose of antibiotics here and admit to medicine for inpatient management of community-acquired pneumonia with associated acute hypoxemia [MP]  1621 Discussed with admitting hospitalist who accepts patient for admission [MP]    Clinical Course User Index [JK] Trish Furl, MD [MP] Sallyanne Creamer, DO   Medical Decision Making I, Rafael Bun DO, have assumed care of  this patient from the previous provider pending results of CTA chest, reevaluation and disposition  Amount and/or Complexity of Data Reviewed Labs: ordered. Decision-making details documented in ED Course. Radiology: ordered.  Risk OTC drugs. Prescription drug management. Decision regarding hospitalization.          Sallyanne Creamer, DO 10/12/23 1621

## 2023-10-12 NOTE — H&P (Signed)
 History and Physical    Patient: Ashley Rivera MVH:[id number] DOB: November 25, 1940 DOA: 10/12/2023 DOS: the patient was seen and examined on 10/12/2023 PCP: Azalia Leo, MD  Patient coming from: Home  Chief Complaint:  Chief Complaint  Patient presents with   Chest Pain   HPI: Ashley Rivera is a 83 y.o. female with medical history significant of HTN, hyperlipidemia, premature atrial complexes, paroxysmal SVT, COPD, ulcerative proctitis, former tobacco abuse, and GERD who presents with shortness of breath and chest pain. She is accompanied by her husband, Vallorie Gayer.  She has been experiencing shortness of breath since last night, which began suddenly while preparing for bed. The shortness of breath  was accompanied by a sharp pain in the right side of her chest, radiating to her back.  She mentions that she had been having a 'little dry cough' that was not significant.  She reports feeling chills and warmth several days ago but denies recent fever. She has not been around anyone known to be sick, though she play bridge with a group of 16 elderly women couple days ago.  She has a history of COPD but is not on oxygen therapy. She visited urgent care to avoid hospitalization today, but was sent to the hospital by ambulance.  Records note patient have gone into SVT with heart rates in the 130s to 250 bpm.  She was placed on 4 L of nasal cannula oxygen with improvement after noted to be hypoxic with O2 saturations down to 85% on room air.  She denies palpitations but mentions being treated for a heart condition that does not cause skipped beats. She takes diltiazem  at night for this condition.  She reports a little nausea, describing it as a lack of hunger and a mild sensation that could escalate to nausea. She also mentions low back pain and expresses concern about a possible UTI.  She has a history of smoking but quit about 20 years ago.  In the emergency department patient was noted to be afebrile with  pulse elevated up to 114, respirations 22, blood pressures maintained, and O2 saturations noted to be currently maintained on room air.  Patient was noted to have drop in O2 saturations into the 80s with ambulation..  Labs significant for WBC 14.5, platelets 123, D-dimer 0.33, and high-sensitivity troponins negative x 2.  Chest x-ray noted no acute abnormality.  CT angiogram of the chest with contrast have been obtained which noted no pulmonary embolism, but scattered nodular groundglass and some solid opacities throughout the lungs measuring up to 0.6 cm with small irregular consolidations in the right upper lobe concerning for an infectious/inflammatory process.  Patient had been given DuoNeb breathing treatment, sodium chloride  500 mL bolus, full dose aspirin, and Levaquin.  Review of Systems: As mentioned in the history of present illness. All other systems reviewed and are negative. Past Medical History:  Diagnosis Date   Allergy    Asthma    bronchial asthma   Cataract    left eye   Complication of anesthesia    Became congested after colonoscopy   COPD (chronic obstructive pulmonary disease) (HCC)    Diverticulosis    Emphysema of lung (HCC)    Genital warts    Dx about 30 years ago    GERD (gastroesophageal reflux disease)    Hemorrhoids    History of hiatal hernia    History of kidney stones    Hx of colonic polyp    Hx: UTI (urinary tract  infection)    Hypertension    Osteoporosis    Pneumonia    Proctitis 11/12/2011   Skin cancer    basal cell   Urinary incontinence    On occasion   Past Surgical History:  Procedure Laterality Date   BIOPSY  07/03/2020   Procedure: BIOPSY;  Surgeon: Sergio Dandy, MD;  Location: [facility];  Service: Endoscopy;;   COLONOSCOPY     COLONOSCOPY WITH PROPOFOL  N/A 10/28/2019   Procedure: COLONOSCOPY WITH PROPOFOL   LARGE CECAL POLYP;  Surgeon: Sergio Dandy, MD;  Location: [facility];  Service: Endoscopy;  Laterality: N/A;    COLONOSCOPY WITH PROPOFOL  N/A 07/03/2020   Procedure: COLONOSCOPY WITH PROPOFOL ;  Surgeon: Sergio Dandy, MD;  Location: [facility];  Service: Endoscopy;  Laterality: N/A;   CYSTOSCOPY/URETEROSCOPY/HOLMIUM LASER/STENT PLACEMENT Right 11/09/2019   Procedure: CYSTOSCOPY/URETEROSCOPY/HOLMIUM LASER/STENT PLACEMENT;  Surgeon: Christina Coyer, MD;  Location: [facility];  Service: Urology;  Laterality: Right;   DILATION AND CURETTAGE OF UTERUS     50 years ago. Removed rest of placenta from birth   ENDOSCOPIC MUCOSAL RESECTION N/A 10/28/2019   Procedure: ENDOSCOPIC MUCOSAL RESECTION;  Surgeon: Sergio Dandy, MD;  Location: [facility];  Service: Endoscopy;  Laterality: N/A;   EVALUATION UNDER ANESTHESIA WITH HEMORRHOIDECTOMY N/A 11/28/2021   Procedure: EXAM UNDER ANESTHESIA WITH HEMORRHOIDECTOMY 1 VS 2 COLUMN;  Surgeon: Melvenia Stabs, MD;  Location: [facility];  Service: General;  Laterality: N/A;   EYE SURGERY     cataract   HEMOSTASIS CLIP PLACEMENT  10/28/2019   Procedure: HEMOSTASIS CLIP PLACEMENT;  Surgeon: Sergio Dandy, MD;  Location: [facility];  Service: Endoscopy;;   INNER EAR SURGERY Right 2006   Tumor removed    POLYPECTOMY  10/28/2019   Procedure: POLYPECTOMY;  Surgeon: Sergio Dandy, MD;  Location: [facility];  Service: Endoscopy;;   POLYPECTOMY  07/03/2020   Procedure: POLYPECTOMY;  Surgeon: Sergio Dandy, MD;  Location: [facility];  Service: Endoscopy;;   SUBMUCOSAL LIFTING INJECTION  10/28/2019   Procedure: SUBMUCOSAL LIFTING INJECTION;  Surgeon: Nandigam, Kavitha V, MD;  Location: [facility];  Service: Endoscopy;;   SUBMUCOSAL TATTOO INJECTION  10/28/2019   Procedure: SUBMUCOSAL TATTOO INJECTION;  Surgeon: Nandigam, Kavitha V, MD;  Location: [facility];  Service: Endoscopy;;   TONSILLECTOMY AND ADENOIDECTOMY  1950   Social History:  reports that she quit smoking about 16 years ago. Her smoking use included cigarettes. She  started smoking about 46 years ago. She has never used smokeless tobacco. She reports current alcohol  use. She reports that she does not use drugs.  Allergies  Allergen Reactions   Cephalexin      Other reaction(s): rash on chest/hives    Family History  Problem Relation Age of Onset   Lung cancer Mother        smoker but had quit   Heart disease Father    Colon cancer Paternal Grandmother    Breast cancer Maternal Aunt    Esophageal cancer Neg Hx    Rectal cancer Neg Hx    Stomach cancer Neg Hx    Pancreatic cancer Neg Hx     Prior to Admission medications   Medication Sig Start Date End Date Taking? Authorizing Provider  Budeson-Glycopyrrol-Formoterol (BREZTRI AEROSPHERE IN) Inhale into the lungs. 2 puffs twice per day    [provider]  calcium carbonate (TUMS - DOSED IN MG ELEMENTAL CALCIUM) 500 MG chewable tablet Chew 1 tablet by mouth as needed for indigestion  or heartburn.    [provider]  carboxymethylcellulose (REFRESH PLUS) 0.5 % SOLN Place 2 drops into both eyes 2 (two) times daily as needed (dry/irritated eyes).    [provider]  cetirizine (ZYRTEC) 10 MG tablet Take 10 mg by mouth daily as needed for allergies.     [provider]  chlorthalidone (HYGROTON) 25 MG tablet Take 25 mg by mouth every other day. 03/01/21   [provider]  Cholecalciferol (VITAMIN D3) 50 MCG (2000 UT) TABS Take 2,000 Units by mouth daily.    [provider]  Cyanocobalamin  (VITAMIN B12 PO) Take by mouth. daily    [provider]  diltiazem  (CARDIZEM  CD) 120 MG 24 hr capsule TAKE 1 CAPSULE(120 MG) BY MOUTH DAILY 02/12/23   Chandrasekhar, Mahesh A, MD  ezetimibe (ZETIA) 10 MG tablet Take 10 mg by mouth daily. 03/01/21   [provider]  FOLIC ACID  PO Take by mouth.    [provider]  hydrocortisone  2.5 % cream as needed. 02/28/21   [provider]  MELATONIN PO Take 8 mg by mouth at bedtime.     [provider]  Omega-3 Fatty Acids (FISH OIL ULTRA) 1400 MG CAPS Take 1,400 mg by mouth daily.    [provider]  pantoprazole  (PROTONIX ) 20 MG tablet Take 20 mg by mouth as needed for heartburn or indigestion.    [provider]  Probiotic Product (ALIGN) 4 MG CAPS Take 4 mg by mouth daily.    [provider]  sulfaSALAzine  (AZULFIDINE ) 500 MG EC tablet Take 2 tablets (1,000 mg total) by mouth 2 (two) times daily. 09/03/23   Graciella Lavender, PA  tiotropium (SPIRIVA) 18 MCG inhalation capsule Place 18 mcg into inhaler and inhale daily as needed (asthma).     [provider]  VENTOLIN HFA 108 (90 BASE) MCG/ACT inhaler Inhale 1 puff into the lungs as needed for wheezing or shortness of breath.  11/04/14   [provider]  Wheat Dextrin (BENEFIBER PO) Take 1 Dose by mouth daily.    [provider]  zoledronic  acid (RECLAST ) 5 MG/100ML SOLN injection Inject 5 mg into the vein once. yearly    [provider]    Physical Exam: Vitals:   10/12/23 1141 10/12/23 1430 10/12/23 1618  BP: (!) 139/112  (!) 149/80  Pulse: (!) 109 99 (!) 103  Resp: 20 20 18   Temp: 99.3 F (37.4 C)  97.7 F (36.5 C)  TempSrc: Temporal  Temporal  SpO2: 100% 98% 96%   Constitutional: Elderly female who appears ill but in no acute distress Eyes: PERRL, lids and conjunctivae normal ENMT: Mucous membranes are moist.  Normal dentition.  Neck: normal, supple, no JVD appreciated Respiratory: Decreased aeration without significant wheezes appreciated at this time.  O2 saturations currently maintained on room air.  Patient talking in short sentences. Cardiovascular: Regular rate and rhythm, no murmurs / rubs / gallops. No extremity edema.   Abdomen: Mild suprapubic tenderness to palpation appreciated.  Bowel sounds present in all 4 quadrants. Musculoskeletal: no clubbing / cyanosis. No joint deformity upper and lower extremities. Good ROM, no  contractures. Normal muscle tone.  Skin: no rashes, lesions, ulcers. No induration Neurologic: CN 2-12 grossly intact.   Strength 5/5 in all 4.  Psychiatric: Normal judgment and insight. Alert and oriented x 3. Normal mood.   Data Reviewed:  EKG reveals sinus tachycardia 108 bpm with probable left atrial enlargement.  Assessment and Plan:  Acute respiratory  failure with hypoxia  Sepsis secondary to community-acquired pneumonia Patient presented with complaints of right-sided chest pain and increasing shortness of breath starting last night.  Reports having minimal nonproductive cough.  CT angiogram of the chest noted concern for bilateral pulmonary nodular opacities as well as right upper lobe consolidation concerning for infectious/inflammatory pathology.  Patiently noted to be tachycardic and tachypneic with white blood cell count elevated up to 14.5 meeting SIRS criteria.  Patient noted to have acute respiratory failure with hypoxia with O2 saturations noted to be as low as 85% on room air at urgent care prior to arrival.  Patient had been started on empiric antibiotics of Levaquin. -Admit to a telemetry bed - Continuous pulse oximetry with nasal cannula oxygen maintain O2 saturation greater than 90% -Incentive spirometry and flutter valve - Check blood cultures and lactic acid - Check respiratory virus panel - Check procalcitonin - Continue empiric antibiotics of Levaquin due to cephalosporin allergy - Mucinex  COPD On physical exam patient without significant wheezing appreciated. - Continue Breztri - DuoNebs as needed for shortness of breath/wheezing  Paroxysmal SVT Patient reportedly had gone into SVT while at urgent care with heart rates reported to be 130 to 250 bpm.  Records note patient is followed by cardiology and had been placed on Cardizem  in regards to this which she does report taking regularly at night.  Records note patient previously had been noted to have paroxysmal  atrial contractions and paroxysmal SVT. - Continue Cardizem  - Follow-up telemetry overnight  Essential hypertension Blood pressures noted to be elevated up to 149/80.  - Continue home blood pressure regimen  Low back pain Patient did report having some abdominal discomfort with patient and reports some complaints of lower back pain for which she was concerned for the possibility of a urinary tract infection.  Denies any injury to onset of symptoms. - Check urinalysis  History of ulcerative proctitis - Continue sulfasalazine   Lung nodules Patient was noted to have scattered nodular groundglass and subsolid opacities throughout the lungs measuring up to 0.6 m.  Patient does have a significant history previously of tobacco abuse but quit over 10 years ago. - Recommend noncontrasted CT of the chest in 3 to 6 months.   DVT prophylaxis: Lovenox Advance Care Planning:   Code Status: Full Code    Consults: None  Family Communication: Husband updated at bedside  Severity of Illness: The appropriate patient status for this patient is INPATIENT. Inpatient status is judged to be reasonable and necessary in order to provide the required intensity of service to ensure the patient's safety. The patient's presenting symptoms, physical exam findings, and initial radiographic and laboratory data in the context of their chronic comorbidities is felt to place them at high risk for further clinical deterioration. Furthermore, it is not anticipated that the patient will be medically stable for discharge from the hospital within 2 midnights of admission.   * I certify that at the point of admission it is my clinical judgment that the patient will require inpatient hospital care spanning beyond 2 midnights from the point of admission due to high intensity of service, high risk for further deterioration and high frequency of surveillance required.*  Author: Lena Qualia, MD 10/12/2023 4:19 PM  For on  call review [email or web address].

## 2023-10-12 NOTE — ED Notes (Signed)
 Patient is being discharged from the Urgent Care and sent to the Emergency Department via [facility] . Per [facility] PA, patient is in need of higher level of care due to Hypoxia and SVT. Patient is aware and verbalizes understanding of plan of care.  Vitals:   10/12/23 1046 10/12/23 1056  BP: 136/60   Pulse: (!) 114   Resp: (!) 22   Temp: 98.1 F (36.7 C)   SpO2: (!) 85% 94%

## 2023-10-13 DIAGNOSIS — A419 Sepsis, unspecified organism: Secondary | ICD-10-CM | POA: Diagnosis not present

## 2023-10-13 DIAGNOSIS — J189 Pneumonia, unspecified organism: Secondary | ICD-10-CM | POA: Diagnosis not present

## 2023-10-13 LAB — BASIC METABOLIC PANEL WITH GFR
Anion gap: 12 (ref 5–15)
BUN: 6 mg/dL — ABNORMAL LOW (ref 8–23)
CO2: 27 mmol/L (ref 22–32)
Calcium: 8.8 mg/dL — ABNORMAL LOW (ref 8.9–10.3)
Chloride: 99 mmol/L (ref 98–111)
Creatinine, Ser: 0.72 mg/dL (ref 0.44–1.00)
GFR, Estimated: >60 mL/min (ref >60)
Glucose, Bld: 105 mg/dL — ABNORMAL HIGH (ref 70–99)
Potassium: 3.2 mmol/L — ABNORMAL LOW (ref 3.5–5.1)
Sodium: 138 mmol/L (ref 135–145)

## 2023-10-13 LAB — CBC
HCT: 40.4 % (ref 36.0–46.0)
Hemoglobin: 13.3 g/dL (ref 12.0–15.0)
MCH: 30 pg (ref 26.0–34.0)
MCHC: 32.9 g/dL (ref 30.0–36.0)
MCV: 91 fL (ref 80.0–100.0)
Platelets: 134 10*3/uL — ABNORMAL LOW (ref 150–400)
RBC: 4.44 MIL/uL (ref 3.87–5.11)
RDW: 12.8 % (ref 11.5–15.5)
WBC: 12.7 10*3/uL — ABNORMAL HIGH (ref 4.0–10.5)
nRBC: 0 % (ref 0.0–0.2)

## 2023-10-13 MED ORDER — LEVOFLOXACIN IN D5W 750 MG/150ML IV SOLN: 750.0000 mg | INTRAVENOUS | Status: DC

## 2023-10-13 MED ORDER — POTASSIUM CHLORIDE CRYS ER 20 MEQ PO TBCR
40.0000 meq | EXTENDED_RELEASE_TABLET | ORAL | Status: AC
  Administered 2023-10-13 (×2): 40 meq via ORAL
  Filled 2023-10-13 (×2): qty 2

## 2023-10-13 NOTE — TOC Initial Note (Signed)
 Transition of Care (TOC) - Initial/Assessment Note   Patient from home with husband. Has walkers and canes at home ( currently not using).   Patient does not have home oxygen. Currently patient is on room air. If home oxygen needed will need ambulation note and order.    Transition of Care Department [facility]) has reviewed patient and no TOC needs have been identified at this time. We will continue to monitor patient advancement through interdisciplinary progression rounds. If new patient transition needs arise, please place a TOC consult.   Patient Details  Name: Ashley Rivera MRN: [id number] Date of Birth: 12-18-1940  Transition of Care [facility]) CM/SW Contact:    Terre Ferri, RN Phone Number: 10/13/2023, 2:17 PM  Clinical Narrative:                   Expected Discharge Plan: Home/Self Care Barriers to Discharge: Continued Medical Work up   Patient Goals and CMS Choice Patient states their goals for this hospitalization and ongoing recovery are:: to return to home   Choice offered to / list presented to : NA      Expected Discharge Plan and Services   Discharge Planning Services: CM Consult Post Acute Care Choice: NA Living arrangements for the past 2 months: Single Family Home                 DME Arranged: N/A DME Agency: NA       HH Arranged: NA HH Agency: NA        Prior Living Arrangements/Services Living arrangements for the past 2 months: Single Family Home Lives with:: Spouse Patient language and need for interpreter reviewed:: Yes Do you feel safe going back to the place where you live?: Yes      Need for Family Participation in Patient Care: Yes (Comment) Care giver support system in place?: Yes (comment) Current home services: DME Criminal Activity/Legal Involvement Pertinent to Current Situation/Hospitalization: No - Comment as needed  Activities of Daily Living      Permission Sought/Granted   Permission granted to share information with :  No              Emotional Assessment Appearance:: Appears stated age Attitude/Demeanor/Rapport: Engaged Affect (typically observed): Appropriate Orientation: : Oriented to Self, Oriented to Place, Oriented to  Time, Oriented to Situation Alcohol  / Substance Use: Not Applicable Psych Involvement: No (comment)  Admission diagnosis:  Hypoxemia [R09.02] Sepsis due to pneumonia (HCC) [J18.9, A41.9] Community acquired pneumonia of right upper lobe of lung [J18.9] Patient Active Problem List   Diagnosis Date Noted   Sepsis due to pneumonia (HCC) 10/12/2023   Acute respiratory failure with hypoxia (HCC) 10/12/2023   Low back pain 10/12/2023   Lung nodule 10/12/2023   PAC (premature atrial contraction) 11/26/2021   Paroxysmal SVT (supraventricular tachycardia) (HCC) 11/26/2021   Frequent PVCs 06/20/2021   Unspecified open wound, right lower leg, initial encounter 04/23/2021   Hardening of the aorta (main artery of the heart) (HCC) 04/23/2021   Essential hypertension 04/23/2021   Anemia 04/23/2021   Meningioma (HCC) 07/10/2020   Polyp of rectum    Chronic obstructive pulmonary disease (HCC) 01/28/2020   Coronary artery calcification 01/28/2020   SOB (shortness of breath) 01/28/2020   Polyp of cecum    Non-thrombocytopenic purpura (HCC) 11/11/2015   Microscopic hematuria 10/26/2015   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   Diaphragmatic hernia 03/03/2015   GERD (gastroesophageal reflux disease) 01/17/2015   Vitamin D deficiency 10/31/2014  Peripheral venous insufficiency 09/16/2012   Pruritus ani 12/04/2011   History of proctitis 12/04/2011   History of colonic polyps 10/22/2011   Internal hemorrhoids 10/22/2011   Hyperlipidemia 02/04/2009   Age-related osteoporosis without current pathological fracture 02/04/2009   PCP:  Azalia Leo, MD Pharmacy:   [facility] [id number] - [place], [place] - [address] [place] [address] Phone: [phone] Fax:  [phone]     Social Drivers of Health (SDOH) Social History: SDOH Screenings   Food Insecurity: No Food Insecurity (10/12/2023)  Housing: Low Risk  (10/12/2023)  Transportation Needs: No Transportation Needs (10/12/2023)  Utilities: Not At Risk (10/12/2023)  Social Connections: Moderately Integrated (10/12/2023)  Tobacco Use: Medium Risk (10/12/2023)   SDOH Interventions:     Readmission Risk Interventions     No data to display

## 2023-10-13 NOTE — Hospital Course (Signed)
 83yo with h/o HTN, HLD, PSVT, COPD not on O2, ulcerative proctitis, and GERD who presented on 6/8 with SOB and CP.  Seen at UC, SVT with HR 130s-250s, O2 sats 85%.  CXR negative, CTA with GGO suggestive of infectious/inflammatory process.  Started on Levaquin, nebs, ASA.

## 2023-10-13 NOTE — Progress Notes (Signed)
 Progress Note   Patient: Ashley Rivera ZOX:[id number] DOB: [birth year] DOA: 10/12/2023     1 DOS: the patient was seen and examined on 10/13/2023   Brief hospital course: 83yo with h/o HTN, HLD, PSVT, COPD not on O2, ulcerative proctitis, and GERD who presented on 6/8 with SOB and CP.  Seen at UC, SVT with HR 130s-250s, O2 sats 85%.  CXR negative, CTA with GGO suggestive of infectious/inflammatory process.  Started on Levaquin, nebs, ASA.      Assessment and Plan:  Sepsis secondary to community-acquired pneumonia Patient presented with complaints of right-sided chest pain and increasing shortness of breath starting last night Minimal nonproductive cough CT angiogram of the chest noted concern for bilateral pulmonary nodular opacities as well as right upper lobe consolidation concerning for infectious/inflammatory pathology Patiently noted to be tachycardic and tachypneic with white blood cell count elevated up to 14.5 meeting SIRS criteria Also with acute respiratory failure with hypoxia with O2 saturations noted to be as low as 85% on room air at urgent care prior to arrival SIRS physiology and hypoxia have resolved Patient had been started on empiric Levaquin Admit to telemetry Incentive spirometry and flutter valve Negative lactate and procalcitonin Blood cultures NTD Mucinex   COPD On physical exam patient without significant wheezing appreciated Not on home O2 Will do ambulatory pulse ox in AM Continue Breztri DuoNebs as needed for shortness of breath/wheezing   Paroxysmal SVT Patient reportedly had gone into SVT while at urgent care with heart rates reported to be 130 to 250 bpm Followed by cardiology and started on diltiazem   Continue Cardizem  Follow-up telemetry overnight Will check echo to determine whether inpatient or outpatient cardiology consult is needed   Essential hypertension Blood pressures noted to be elevated up to 149/80 Continue diltiazem , chlorthalidone QOD    Low back pain Patient did report having some abdominal discomfort with patient and reports some complaints of lower back pain for which she was concerned for the possibility of a urinary tract infection Denies any injury to onset of symptoms Unremarkable urinalysis   History of ulcerative proctitis Continue sulfasalazine    Lung nodules Patient was noted to have scattered nodular groundglass and subsolid opacities throughout the lungs measuring up to 0.6 m.   Significant history previously of tobacco abuse but quit over 10 years ago Recommend noncontrasted CT of the chest in 3 to 6 months.    Consultants: None  Procedures: Echocardiogram ordered  Antibiotics: Levaquin 6/8-  30 Day Unplanned Readmission Risk Score    Flowsheet Row ED to Hosp-Admission (Current) from 10/12/2023 in [facility]  SURGICAL  30 Day Unplanned Readmission Risk Score (%) 11.26 Filed at 10/13/2023 0801       This score is the patient's risk of an unplanned readmission within 30 days of being discharged (0 -100%). The score is based on dignosis, age, lab data, medications, orders, and past utilization.   Low:  0-14.9   Medium: 15-21.9   High: 22-29.9   Extreme: 30 and above           Subjective: Feeling better.  Off O2.  Cannot tell when she is having tachycardia   Objective: Vitals:   10/13/23 1216 10/13/23 1413  BP: (!) 116/51 112/62  Pulse: 92 (!) 104  Resp:  17  Temp:  98.2 F (36.8 C)  SpO2: 98% 97%    Intake/Output Summary (Last 24 hours) at 10/13/2023 1559 Last data filed at 10/13/2023 1325 Gross per 24 hour  Intake 120 ml  Output --  Net 120 ml   Filed Weights   10/12/23 1842  Weight: 63.7 kg    Exam:  General:  Appears calm and comfortable and is in NAD Eyes:  normal lids, iris ENT:  grossly normal hearing, lips & tongue, mmm Cardiovascular:  RRR. No LE edema.  Respiratory:   CTA bilaterally with no wheezes/rales/rhonchi.  Normal respiratory  effort. Abdomen:  soft, NT, ND Skin:  no rash or induration seen on limited exam Musculoskeletal:  grossly normal tone BUE/BLE, good ROM, no bony abnormality Psychiatric:  grossly normal mood and affect, speech fluent and appropriate, AOx3 Neurologic:  CN 2-12 grossly intact, moves all extremities in coordinated fashion   Data Reviewed: I have reviewed the patient's lab results since admission.  Pertinent labs for today include:   K+ 3.2, repleted Glucose 105 WBC 12.7, down from 14.5 Platelets 134, improved COVID negative RVP negative UA: large Hgb, 20 ketones, 30 protein Blood cultures NTD    Family Communication: Husband and son were present  Disposition: Status is: Inpatient Remains inpatient appropriate because: ongoing management, possibly home 6/10     Time spent: 50 minutes  Unresulted Labs (From admission, onward)     Start     Ordered   Unscheduled  CBC with Differential/Platelet  Tomorrow morning,   R        10/13/23 1559   Unscheduled  Basic metabolic panel with GFR  Tomorrow morning,   R        10/13/23 1559             Author: Lorita Rosa, MD 10/13/2023 3:59 PM  For on call review [email or web address].

## 2023-10-13 NOTE — Progress Notes (Signed)
 Mobility Specialist Progress Note:    10/13/23 1123  Mobility  Activity Ambulated with assistance in hallway  Level of Assistance Standby assist, set-up cues, supervision of patient - no hands on  Assistive Device None  Distance Ambulated (ft) 180 ft  Activity Response Tolerated well  Mobility Referral Yes  Mobility visit 1 Mobility  Mobility Specialist Start Time (ACUTE ONLY) 1100  Mobility Specialist Stop Time (ACUTE ONLY) 1116  Mobility Specialist Time Calculation (min) (ACUTE ONLY) 16 min   Pt received pt seated EOB, agreeable to mobility. Requested to go to the BR prior to ambulating halls. Distance limited d/t fatigue. Returned to room w/o fault. Call bell and personal belongings in reach. All needs met. Son in room.  Inetta Manes Mobility Specialist  Please contact vis Secure Chat or  Rehab Office [phone]

## 2023-10-13 NOTE — Progress Notes (Signed)
 PHARMACY NOTE:  ANTIMICROBIAL RENAL DOSAGE ADJUSTMENT  Current antimicrobial regimen includes a mismatch between antimicrobial dosage and estimated renal function.  As per policy approved by the Pharmacy & Therapeutics and Medical Executive Committees, the antimicrobial dosage will be adjusted accordingly.  Current antimicrobial dosage:  Levaquin 750mg  IV daily  Indication: CAP   Renal Function:  Estimated Creatinine Clearance: 45.6 mL/min (by C-G formula based on SCr of 0.72 mg/dL).    Antimicrobial dosage has been changed to:  Levaquin 750mg  IV q48h. Consider change to doxy in next day or so.   Thank you for allowing pharmacy to be a part of this patient's care.  Enrigue Harvard, PharmD, BCPS Please see amion for complete clinical pharmacist phone list 10/13/2023 1:52 PM

## 2023-10-13 NOTE — Plan of Care (Signed)

## 2023-10-14 ENCOUNTER — Other Ambulatory Visit (HOSPITAL_COMMUNITY)

## 2023-10-14 ENCOUNTER — Inpatient Hospital Stay (HOSPITAL_COMMUNITY)

## 2023-10-14 DIAGNOSIS — A419 Sepsis, unspecified organism: Secondary | ICD-10-CM | POA: Diagnosis not present

## 2023-10-14 DIAGNOSIS — R0603 Acute respiratory distress: Secondary | ICD-10-CM

## 2023-10-14 DIAGNOSIS — J189 Pneumonia, unspecified organism: Secondary | ICD-10-CM | POA: Diagnosis not present

## 2023-10-14 LAB — CBC WITH DIFFERENTIAL/PLATELET
Abs Immature Granulocytes: 0.02 10*3/uL (ref 0.00–0.07)
Basophils Absolute: 0 10*3/uL (ref 0.0–0.1)
Basophils Relative: 1 %
Eosinophils Absolute: 0.1 10*3/uL (ref 0.0–0.5)
Eosinophils Relative: 2 %
HCT: 37.9 % (ref 36.0–46.0)
Hemoglobin: 12.3 g/dL (ref 12.0–15.0)
Immature Granulocytes: 0 %
Lymphocytes Relative: 11 %
Lymphs Abs: 0.9 10*3/uL (ref 0.7–4.0)
MCH: 29.9 pg (ref 26.0–34.0)
MCHC: 32.5 g/dL (ref 30.0–36.0)
MCV: 92 fL (ref 80.0–100.0)
Monocytes Absolute: 1 10*3/uL (ref 0.1–1.0)
Monocytes Relative: 12 %
Neutro Abs: 5.9 10*3/uL (ref 1.7–7.7)
Neutrophils Relative %: 74 %
Platelets: 125 10*3/uL — ABNORMAL LOW (ref 150–400)
RBC: 4.12 MIL/uL (ref 3.87–5.11)
RDW: 12.7 % (ref 11.5–15.5)
WBC: 7.9 10*3/uL (ref 4.0–10.5)
nRBC: 0 % (ref 0.0–0.2)

## 2023-10-14 LAB — BASIC METABOLIC PANEL WITH GFR
Anion gap: 8 (ref 5–15)
BUN: 11 mg/dL (ref 8–23)
CO2: 29 mmol/L (ref 22–32)
Calcium: 9.2 mg/dL (ref 8.9–10.3)
Chloride: 101 mmol/L (ref 98–111)
Creatinine, Ser: 0.74 mg/dL (ref 0.44–1.00)
GFR, Estimated: >60 mL/min (ref >60)
Glucose, Bld: 98 mg/dL (ref 70–99)
Potassium: 4.4 mmol/L (ref 3.5–5.1)
Sodium: 138 mmol/L (ref 135–145)

## 2023-10-14 LAB — ECHOCARDIOGRAM COMPLETE
Area-P 1/2: 3.17 cm2
Height: 61 in
S' Lateral: 2.5 cm
Weight: 2246.93 [oz_av]

## 2023-10-14 MED ORDER — LEVOFLOXACIN 750 MG PO TABS
750.0000 mg | ORAL_TABLET | ORAL | Status: DC
  Administered 2023-10-14: 750 mg via ORAL
  Filled 2023-10-14: qty 1

## 2023-10-14 MED ORDER — PANTOPRAZOLE SODIUM 40 MG PO TBEC
40.0000 mg | DELAYED_RELEASE_TABLET | Freq: Every day | ORAL | Status: DC
  Administered 2023-10-14 – 2023-10-15 (×2): 40 mg via ORAL
  Filled 2023-10-14 (×2): qty 1

## 2023-10-14 NOTE — Plan of Care (Signed)
  Problem: Education: Goal: Knowledge of General Education information will improve Description: Including pain rating scale, medication(s)/side effects and non-pharmacologic comfort measures Outcome: Progressing   Problem: Health Behavior/Discharge Planning: Goal: Ability to manage health-related needs will improve Outcome: Progressing   Problem: Clinical Measurements: Goal: Will remain free from infection Outcome: Progressing   Problem: Clinical Measurements: Goal: Respiratory complications will improve Outcome: Progressing   Problem: Nutrition: Goal: Adequate nutrition will be maintained Outcome: Progressing   Problem: Elimination: Goal: Will not experience complications related to bowel motility Outcome: Progressing   Problem: Elimination: Goal: Will not experience complications related to urinary retention Outcome: Progressing   Problem: Pain Managment: Goal: General experience of comfort will improve and/or be controlled Outcome: Progressing

## 2023-10-14 NOTE — Progress Notes (Signed)
  Echocardiogram 2D Echocardiogram has been performed.  Farley Honer, RDCS 10/14/2023, 3:59 PM

## 2023-10-14 NOTE — Progress Notes (Signed)
 Mobility Specialist Progress Note:  Nurse requested Mobility Specialist to perform oxygen saturation test with pt which includes removing pt from oxygen both at rest and while ambulating.  Below are the results from that testing.     Patient Saturations on Room Air at Rest = spO2 94%  Patient Saturations on Room Air while Ambulating = sp02 88% .  Rested and performed pursed lip breathing for 1 minute with sp02 at 91%.  At end of testing pt left in room on 0  Liters of oxygen.  Reported results to nurse.    Inetta Manes Mobility Specialist  Please contact vis Secure Chat or  Rehab Office [phone]

## 2023-10-14 NOTE — Progress Notes (Signed)
 PHARMACIST - PHYSICIAN COMMUNICATION DR:   Carlette Cheers CONCERNING: Antibiotic IV to Oral Route Change Policy  RECOMMENDATION: This patient is receiving Levofloxacin by the intravenous route.  Based on criteria approved by the Pharmacy and Therapeutics Committee, the antibiotic(s) is/are being converted to the equivalent oral dose form(s).   DESCRIPTION: These criteria include: Patient being treated for a respiratory tract infection, urinary tract infection, cellulitis or clostridium difficile associated diarrhea if on metronidazole The patient is not neutropenic and does not exhibit a GI malabsorption state The patient is eating (either orally or via tube) and/or has been taking other orally administered medications for a least 24 hours The patient is improving clinically and has a Tmax < 100.5  If you have questions about this conversion, please contact the Pharmacy Department  []   [phone] )  Cristine Done []   [phone] )  [facility] [x]   [phone] )  Arlin Benes []   [phone] )  [facility] []   [phone] )  [facility]

## 2023-10-14 NOTE — Progress Notes (Signed)
 Mobility Specialist Progress Note:   10/14/23 1224  Mobility  Activity Ambulated with assistance in hallway  Level of Assistance Standby assist, set-up cues, supervision of patient - no hands on  Assistive Device None  Distance Ambulated (ft) 220 ft  Activity Response Tolerated well  Mobility Referral Yes  Mobility visit 1 Mobility  Mobility Specialist Start Time (ACUTE ONLY) 1205  Mobility Specialist Stop Time (ACUTE ONLY) 1216  Mobility Specialist Time Calculation (min) (ACUTE ONLY) 11 min   Pt received seated EOB agreeable to mobility. No physical assistance required. No c/o throughout and no SOB. Returned to room w/o fault. Left seated EOB w/ call bell and personal belongings in reach. All needs met. Left on RA.   Pre Mobility RA SPO2 94% During Mobility RA SPO2 88%-91% Post Mobility RA SPO2 91%  Inetta Manes Mobility Specialist  Please contact vis Secure Chat or  Rehab Office [phone]

## 2023-10-14 NOTE — Progress Notes (Signed)
  Progress Note   Patient: Ashley Rivera WGN:[id number] DOB: 02/12/1941 DOA: 10/12/2023     2 DOS: the patient was seen and examined on 10/14/2023   Brief hospital course: 83yo with h/o HTN, HLD, PSVT, COPD not on O2, ulcerative proctitis, and GERD who presented on 6/8 with SOB and CP.  Seen at UC, SVT with HR 130s-250s, O2 sats 85%.  CXR negative, CTA with GGO suggestive of infectious/inflammatory process.  Started on Levaquin, nebs, ASA.      Assessment and Plan: Sepsis due to CAP  - Levofloxacin 750 mg PO q48 hr  - Mucinex 600 mg PO bid   COPD - Breztri 2 puff bid  - Duoneb q4 hr PRN   Paroxysmal SVT - Diltiazem  120 mg PO daily   HTN - Chlorthalidone 25 mg PO every other day   Low back pain  - Monitor   Hx of ulcerative proctitis  - Sulfasalazine  1000 mg PO at bedtime   Lung nodules  - This will require outpt follow up once discharged   Subjective: Pt seen and examined at the bedside. WBC has down trended 12.7 -->7.9. She continues on antibx and breathing tx's. Titrate oxygen off as tolerated as she does not wear oxygen at home.  Physical Exam: Vitals:   10/13/23 2011 10/14/23 0336 10/14/23 0818 10/14/23 0846  BP: 135/69 138/67 124/71   Pulse: 92 95 92   Resp: 18 19 17    Temp: 98.2 F (36.8 C) 97.8 F (36.6 C) 97.6 F (36.4 C)   TempSrc: Oral Oral Oral   SpO2: 95% 95% 96% 96%  Weight:      Height:       Physical Exam HENT:     Head: Normocephalic.  Cardiovascular:     Rate and Rhythm: Normal rate.  Pulmonary:     Effort: Pulmonary effort is normal.  Abdominal:     Palpations: Abdomen is soft.  Musculoskeletal:        General: Normal range of motion.  Skin:    General: Skin is warm.  Neurological:     Mental Status: She is alert and oriented to person, place, and time.  Psychiatric:        Mood and Affect: Mood normal.      Disposition: Status is: Inpatient Remains inpatient appropriate because: Titrate oxygen   Planned Discharge Destination:  Home    Time spent: 35 minutes  Author: Bhargav Barbaro , MD 10/14/2023 10:03 AM  For on call review [email or web address].

## 2023-10-15 DIAGNOSIS — J189 Pneumonia, unspecified organism: Secondary | ICD-10-CM | POA: Diagnosis not present

## 2023-10-15 DIAGNOSIS — A419 Sepsis, unspecified organism: Secondary | ICD-10-CM | POA: Diagnosis not present

## 2023-10-15 LAB — COMPREHENSIVE METABOLIC PANEL WITH GFR
ALT: 18 U/L (ref 0–44)
AST: 17 U/L (ref 15–41)
Albumin: 2.8 g/dL — ABNORMAL LOW (ref 3.5–5.0)
Alkaline Phosphatase: 40 U/L (ref 38–126)
Anion gap: 7 (ref 5–15)
BUN: 11 mg/dL (ref 8–23)
CO2: 33 mmol/L — ABNORMAL HIGH (ref 22–32)
Calcium: 9.4 mg/dL (ref 8.9–10.3)
Chloride: 97 mmol/L — ABNORMAL LOW (ref 98–111)
Creatinine, Ser: 0.81 mg/dL (ref 0.44–1.00)
GFR, Estimated: >60 mL/min (ref >60)
Glucose, Bld: 105 mg/dL — ABNORMAL HIGH (ref 70–99)
Potassium: 3.8 mmol/L (ref 3.5–5.1)
Sodium: 137 mmol/L (ref 135–145)
Total Bilirubin: 0.5 mg/dL (ref 0.0–1.2)
Total Protein: 6.1 g/dL — ABNORMAL LOW (ref 6.5–8.1)

## 2023-10-15 LAB — CBC
HCT: 39.1 % (ref 36.0–46.0)
Hemoglobin: 12.5 g/dL (ref 12.0–15.0)
MCH: 29.2 pg (ref 26.0–34.0)
MCHC: 32 g/dL (ref 30.0–36.0)
MCV: 91.4 fL (ref 80.0–100.0)
Platelets: 140 10*3/uL — ABNORMAL LOW (ref 150–400)
RBC: 4.28 MIL/uL (ref 3.87–5.11)
RDW: 12.5 % (ref 11.5–15.5)
WBC: 5.8 10*3/uL (ref 4.0–10.5)
nRBC: 0 % (ref 0.0–0.2)

## 2023-10-15 LAB — MAGNESIUM: Magnesium: 1.8 mg/dL (ref 1.7–2.4)

## 2023-10-15 MED ORDER — LEVOFLOXACIN 750 MG PO TABS
750.0000 mg | ORAL_TABLET | ORAL | 0 refills | Status: AC
Start: 2023-10-16 — End: 2023-10-25

## 2023-10-15 MED ORDER — BREZTRI AEROSPHERE 160-9-4.8 MCG/ACT IN AERO: 2.0000 | INHALATION_SPRAY | Freq: Two times a day (BID) | RESPIRATORY_TRACT | 0 refills | Status: AC

## 2023-10-15 NOTE — Progress Notes (Signed)
 Ashley Rivera to be D/C'd  per MD order.  Discussed with the patient and all questions fully answered.  VSS, Skin clean, dry and intact without evidence of skin break down, no evidence of skin tears noted.  IV catheter discontinued intact. Site without signs and symptoms of complications. Dressing and pressure applied.  An After Visit Summary was printed and given to the patient.   D/c education completed with patient/family including follow up instructions, medication list, d/c activities limitations if indicated, with other d/c instructions as indicated by MD - patient able to verbalize understanding, all questions fully answered.   Patient instructed to return to ED, call 911, or call MD for any changes in condition.   Patient to be escorted via WC, and D/C home via private auto.

## 2023-10-15 NOTE — Discharge Summary (Signed)
 Physician Discharge Summary   Patient: Ashley Rivera MRN: [id number] DOB: 07/04/1940  Admit date:     10/12/2023  Discharge date: 10/15/23  Discharge Physician: Mickle Albe    PCP: Azalia Leo, MD     Discharge Diagnoses: Principal Problem:   Sepsis due to pneumonia [facility]) Active Problems:   Acute respiratory failure with hypoxia (HCC)   Chronic obstructive pulmonary disease (HCC)   Paroxysmal SVT (supraventricular tachycardia) (HCC)   Essential hypertension   Low back pain   History of proctitis   Lung nodule  Resolved Problems:   * No resolved hospital problems. *  Hospital Course:  83 yo F admitted for sepsis due to CAP. Pt received Levofloxacin 750 mg PO q48 hr, Mucinex 600 mg PO bid, Breztri 2 puff bid and Duoneb q4 hr PRN. WBC downtrended from 14 -->5.8.  She was able to come off of the oxygen and she was on room air on the morning of 10/15/2023. She was requesting discharge home on 10/15/2023. She will go home with scripts for PO levofloxacin and breztri and pulmonary referral (she has emphysema on CT imaging and pulmonary nodules which require follow up). Lastly, the lung nodules on her CT scan should be followed up on as an outpt (PCP and pulmonary doctor).  DISCHARGE MEDICATION: Allergies as of 10/15/2023       Reactions   Cephalexin     Other reaction(s): rash on chest/hives        Medication List     TAKE these medications    acetaminophen  500 MG tablet Commonly known as: TYLENOL  Take 1,000 mg by mouth as needed for mild pain (pain score 1-3) or moderate pain (pain score 4-6).   aspirin EC 325 MG tablet Take 325 mg by mouth as needed for moderate pain (pain score 4-6) or mild pain (pain score 1-3).   BENEFIBER PO Take 1 Dose by mouth in the morning.   Breztri Aerosphere 160-9-4.8 MCG/ACT Aero inhaler Generic drug: budesonide-glycopyrrolate-formoterol Inhale 2 puffs into the lungs 2 (two) times daily. What changed:  medication strength when to take  this   calcium carbonate 500 MG chewable tablet Commonly known as: TUMS - dosed in mg elemental calcium Chew 1 tablet by mouth as needed for indigestion or heartburn.   carboxymethylcellulose 0.5 % Soln Commonly known as: REFRESH PLUS Place 2 drops into both eyes 2 (two) times daily as needed (dry/irritated eyes).   cetirizine 10 MG tablet Commonly known as: ZYRTEC Take 10 mg by mouth daily as needed for allergies.   chlorthalidone 25 MG tablet Commonly known as: HYGROTON Take 25 mg by mouth every other day.   diltiazem  120 MG 24 hr capsule Commonly known as: CARDIZEM  CD TAKE 1 CAPSULE(120 MG) BY MOUTH DAILY What changed: See the new instructions.   ezetimibe 10 MG tablet Commonly known as: ZETIA Take 10 mg by mouth in the morning.   ferrous sulfate 325 (65 FE) MG EC tablet Take 325 mg by mouth in the morning.   Fish Oil Ultra 1400 MG Caps Take 1,400 mg by mouth at bedtime.   FOLIC ACID  PO Take 1 tablet by mouth in the morning.   levofloxacin 750 MG tablet Commonly known as: LEVAQUIN Take 1 tablet (750 mg total) by mouth every other day for 5 doses. Start taking on: October 16, 2023   MELATONIN PO Take 8 mg by mouth at bedtime.   Reclast  5 MG/100ML Soln injection Generic drug: zoledronic  acid Inject 5 mg into the  vein once. yearly   sulfaSALAzine  500 MG EC tablet Commonly known as: AZULFIDINE  Take 2 tablets (1,000 mg total) by mouth 2 (two) times daily. What changed: when to take this   Ventolin HFA 108 (90 Base) MCG/ACT inhaler Generic drug: albuterol Inhale 1 puff into the lungs as needed for wheezing or shortness of breath.   VITAMIN B12 PO Take 1 tablet by mouth in the morning. daily   Vitamin D3 50 MCG (2000 UT) Tabs Take 2,000 Units by mouth in the morning.        Discharge Exam: Filed Weights   10/12/23 1842  Weight: 63.7 kg   Physical Exam HENT:     Head: Normocephalic.  Cardiovascular:     Rate and Rhythm: Normal rate.  Pulmonary:      Effort: Pulmonary effort is normal.  Abdominal:     Palpations: Abdomen is soft.  Musculoskeletal:        General: Normal range of motion.  Neurological:     Mental Status: She is alert and oriented to person, place, and time.  Psychiatric:        Mood and Affect: Mood normal.      Condition at discharge: good  The results of significant diagnostics from this hospitalization (including imaging, microbiology, ancillary and laboratory) are listed below for reference.   Imaging Studies: ECHOCARDIOGRAM COMPLETE Result Date: 10/14/2023    ECHOCARDIOGRAM REPORT   Patient Name:   Ashley Rivera [facility] Date of Exam: 10/14/2023 Medical Rec #:  [id number]     Height:       61.0 in Accession #:    [id number]    Weight:       140.4 lb Date of Birth:  January 10, 1941      BSA:          1.625 m Patient Age:    83 years      BP:           124/71 mmHg Patient Gender: F             HR:           95 bpm. Exam Location:  Inpatient Procedure: 2D Echo, Cardiac Doppler and Color Doppler (Both Spectral and Color            Flow Doppler were utilized during procedure). Indications:    Acute Respiratory distress R06.03  History:        Patient has prior history of Echocardiogram examinations, most                 recent 06/28/2021. COPD; Risk Factors:Hypertension.  Sonographer:    Kip Peon RDCS Referring Phys: [id number] JENNIFER YATES  Sonographer Comments: Image acquisition challenging due to respiratory motion and Image acquisition challenging due to COPD. IMPRESSIONS  1. Left ventricular ejection fraction, by estimation, is 60 to 65%. The left ventricle has normal function. The left ventricle has no regional wall motion abnormalities. Left ventricular diastolic parameters are consistent with Grade I diastolic dysfunction (impaired relaxation).  2. Right ventricular systolic function is hyperdynamic. The right ventricular size is not well visualized. There is normal pulmonary artery systolic pressure.  3. The mitral valve is abnormal.  No evidence of mitral valve regurgitation. No evidence of mitral stenosis.  4. The aortic valve is tricuspid. Aortic valve regurgitation is not visualized. No aortic stenosis is present.  5. The inferior vena cava is normal in size with greater than 50% respiratory variability, suggesting right atrial pressure of 3 mmHg. Comparison(s): No  significant change from prior study. Prior images reviewed side by side. FINDINGS  Left Ventricle: Left ventricular ejection fraction, by estimation, is 60 to 65%. The left ventricle has normal function. The left ventricle has no regional wall motion abnormalities. The left ventricular internal cavity size was normal in size. There is  no left ventricular hypertrophy. Left ventricular diastolic parameters are consistent with Grade I diastolic dysfunction (impaired relaxation). Right Ventricle: The right ventricular size is not well visualized. No increase in right ventricular wall thickness. Right ventricular systolic function is hyperdynamic. There is normal pulmonary artery systolic pressure. The tricuspid regurgitant velocity is 2.25 m/s, and with an assumed right atrial pressure of 3 mmHg, the estimated right ventricular systolic pressure is 23.2 mmHg. Left Atrium: Left atrial size was normal in size. Right Atrium: Right atrial size was normal in size. Pericardium: There is no evidence of pericardial effusion. Mitral Valve: The mitral valve is abnormal. No evidence of mitral valve regurgitation. No evidence of mitral valve stenosis. Tricuspid Valve: The tricuspid valve is grossly normal. Tricuspid valve regurgitation is not demonstrated. No evidence of tricuspid stenosis. Aortic Valve: The aortic valve is tricuspid. Aortic valve regurgitation is not visualized. No aortic stenosis is present. Pulmonic Valve: The pulmonic valve was normal in structure. Pulmonic valve regurgitation is not visualized. No evidence of pulmonic stenosis. Aorta: The aortic root and ascending aorta are  structurally normal, with no evidence of dilitation. Venous: The inferior vena cava is normal in size with greater than 50% respiratory variability, suggesting right atrial pressure of 3 mmHg. IAS/Shunts: The interatrial septum was not well visualized.  LEFT VENTRICLE PLAX 2D LVIDd:         3.90 cm   Diastology LVIDs:         2.50 cm   LV e' medial:    7.83 cm/s LV PW:         0.80 cm   LV E/e' medial:  7.3 LV IVS:        0.90 cm   LV e' lateral:   9.36 cm/s LVOT diam:     2.00 cm   LV E/e' lateral: 6.1 LV SV:         65 LV SV Index:   40 LVOT Area:     3.14 cm  RIGHT VENTRICLE             IVC RV S prime:     22.80 cm/s  IVC diam: 1.85 cm TAPSE (M-mode): 2.4 cm LEFT ATRIUM             Index        RIGHT ATRIUM          Index LA diam:        3.50 cm 2.15 cm/m   RA Area:     8.92 cm LA Vol (A2C):   21.6 ml 13.29 ml/m  RA Volume:   17.90 ml 11.01 ml/m LA Vol (A4C):   23.2 ml 14.27 ml/m LA Biplane Vol: 23.5 ml 14.46 ml/m  AORTIC VALVE LVOT Vmax:   102.00 cm/s LVOT Vmean:  67.100 cm/s LVOT VTI:    0.206 m  AORTA Ao Root diam: 2.90 cm Ao Asc diam:  3.30 cm MITRAL VALVE               TRICUSPID VALVE MV Area (PHT): 3.17 cm    TR Peak grad:   20.2 mmHg MV Decel Time: 239 msec    TR Vmax:  225.00 cm/s MV E velocity: 57.10 cm/s MV A velocity: 87.50 cm/s  SHUNTS MV E/A ratio:  0.65        Systemic VTI:  0.21 m                            Systemic Diam: 2.00 cm Gloriann Larger MD Electronically signed by Gloriann Larger MD Signature Date/Time: 10/14/2023/5:14:15 PM    Final    CT Angio Chest PE W and/or Wo Contrast Result Date: 10/12/2023 CLINICAL DATA:  PE suspected * Tracking Code: BO * EXAM: CT ANGIOGRAPHY CHEST WITH CONTRAST TECHNIQUE: Multidetector CT imaging of the chest was performed using the standard protocol during bolus administration of intravenous contrast. Multiplanar CT image reconstructions and MIPs were obtained to evaluate the vascular anatomy. RADIATION DOSE REDUCTION: This exam was  performed according to the departmental dose-optimization program which includes automated exposure control, adjustment of the mA and/or kV according to patient size and/or use of iterative reconstruction technique. CONTRAST:  75mL OMNIPAQUE  IOHEXOL  350 MG/ML SOLN COMPARISON:  None Available. FINDINGS: Cardiovascular: Satisfactory opacification of the pulmonary arteries to the segmental level. No evidence of pulmonary embolism. Normal heart size. Left and right coronary artery calcifications. No pericardial effusion. Aortic atherosclerosis. Mediastinum/Nodes: No enlarged mediastinal, hilar, or axillary lymph nodes. Moderate hiatal hernia. Thyroid  gland, trachea, and esophagus demonstrate no significant findings. Lungs/Pleura: Moderate centrilobular and paraseptal emphysema. Diffuse bilateral bronchial wall thickening. Scattered nodular ground-glass and subsolid opacities throughout the lungs, for example in the superior segment left lower lobe measuring up to 0.6 cm (series 6, image 41). Small irregular consolidations in the medial right upper lobe (series 6, image 69). No pleural effusion or pneumothorax. Upper Abdomen: No acute abnormality. Mildly coarse contour of the liver. Partially imaged splenomegaly. Musculoskeletal: No chest wall abnormality. No acute osseous findings. Review of the MIP images confirms the above findings. IMPRESSION: 1. Negative examination for pulmonary embolism. 2. Scattered nodular ground-glass and subsolid opacities throughout the lungs, measuring up to 0.6 cm. Small irregular consolidations in the medial right upper lobe. Findings are most likely infectious or inflammatory, however nodules warrant short-term follow-up to ensure stability. Non-contrast chest CT at 3-6 months is recommended. If nodules persist, subsequent management will be based upon the most suspicious nodule(s). This recommendation follows the consensus statement: Guidelines for Management of Incidental Pulmonary  Nodules Detected on CT Images: From the Fleischner Society 2017; Radiology 2017; 284:228-243. 3. Emphysema and diffuse bilateral bronchial wall thickening. 4. Coronary artery disease. 5. Moderate hiatal hernia. 6. Mildly coarse contour of the liver, suggestive of cirrhosis. Partially imaged splenomegaly. Correlate with biochemical findings. Aortic Atherosclerosis (ICD10-I70.0) and Emphysema (ICD10-J43.9). Electronically Signed   By: Fredricka Jenny M.D.   On: 10/12/2023 15:57   DG Chest Portable 1 View Result Date: 10/12/2023 CLINICAL DATA:  Chest pain. EXAM: PORTABLE CHEST - 1 VIEW COMPARISON:  02/05/2007 FINDINGS: Cardiac silhouette is unremarkable. No pneumothorax or pleural effusion. The lungs are clear. Aorta is calcified. The visualized skeletal structures are unremarkable. IMPRESSION: No acute cardiopulmonary process. Electronically Signed   By: Sydell Eva M.D.   On: 10/12/2023 12:06    Microbiology: Results for orders placed or performed during the hospital encounter of 10/12/23  Culture, blood (Routine X 2) w Reflex to ID Panel     Status: None (Preliminary result)   Collection Time: 10/12/23  4:51 PM   Specimen: BLOOD RIGHT ARM  Result Value Ref Range Status   Specimen Description BLOOD  RIGHT ARM  Final   Special Requests   Final    BOTTLES DRAWN AEROBIC AND ANAEROBIC Blood Culture adequate volume   Culture   Final    NO GROWTH 3 DAYS Performed at [facility], [address] [place], [place] [address]    Report Status PENDING  Incomplete  Culture, blood (Routine X 2) w Reflex to ID Panel     Status: None (Preliminary result)   Collection Time: 10/12/23  4:51 PM   Specimen: BLOOD RIGHT ARM  Result Value Ref Range Status   Specimen Description BLOOD RIGHT ARM  Final   Special Requests   Final    BOTTLES DRAWN AEROBIC AND ANAEROBIC Blood Culture adequate volume   Culture   Final    NO GROWTH 3 DAYS Performed at [facility], [address] [place], [place]  [address]    Report Status PENDING  Incomplete  Respiratory (~20 pathogens) panel by PCR     Status: None   Collection Time: 10/12/23  5:48 PM   Specimen: Nasopharyngeal Swab; Respiratory  Result Value Ref Range Status   Adenovirus NOT DETECTED NOT DETECTED Final   Coronavirus 229E NOT DETECTED NOT DETECTED Final    Comment: (NOTE) The Coronavirus on the Respiratory Panel, DOES NOT test for the novel  Coronavirus (2019 nCoV)    Coronavirus HKU1 NOT DETECTED NOT DETECTED Final   Coronavirus NL63 NOT DETECTED NOT DETECTED Final   Coronavirus OC43 NOT DETECTED NOT DETECTED Final   Metapneumovirus NOT DETECTED NOT DETECTED Final   Rhinovirus / Enterovirus NOT DETECTED NOT DETECTED Final   Influenza A NOT DETECTED NOT DETECTED Final   Influenza B NOT DETECTED NOT DETECTED Final   Parainfluenza Virus 1 NOT DETECTED NOT DETECTED Final   Parainfluenza Virus 2 NOT DETECTED NOT DETECTED Final   Parainfluenza Virus 3 NOT DETECTED NOT DETECTED Final   Parainfluenza Virus 4 NOT DETECTED NOT DETECTED Final   Respiratory Syncytial Virus NOT DETECTED NOT DETECTED Final   Bordetella pertussis NOT DETECTED NOT DETECTED Final   Bordetella Parapertussis NOT DETECTED NOT DETECTED Final   Chlamydophila pneumoniae NOT DETECTED NOT DETECTED Final   Mycoplasma pneumoniae NOT DETECTED NOT DETECTED Final    Comment: Performed at [facility], [address], [place], [place] [address]  SARS Coronavirus 2 by RT PCR (hospital order, performed in [facility] hospital lab) *cepheid single result test* Anterior Nasal Swab     Status: None   Collection Time: 10/12/23  5:48 PM   Specimen: Anterior Nasal Swab  Result Value Ref Range Status   SARS Coronavirus 2 by RT PCR NEGATIVE NEGATIVE Final    Comment: Performed at [facility], [address], [place], [place] [address]    Labs: CBC: Recent Labs  Lab 10/12/23 1200 10/13/23 0608 10/14/23 0603 10/15/23 0626  WBC 14.5* 12.7* 7.9 5.8  NEUTROABS   --   --  5.9  --   HGB 13.6 13.3 12.3 12.5  HCT 42.1 40.4 37.9 39.1  MCV 92.5 91.0 92.0 91.4  PLT 123* 134* 125* 140*   Basic Metabolic Panel: Recent Labs  Lab 10/12/23 1200 10/13/23 0608 10/14/23 0603 10/15/23 0626  NA 136 138 138 137  K 3.5 3.2* 4.4 3.8  CL 97* 99 101 97*  CO2 27 27 29  33*  GLUCOSE 118* 105* 98 105*  BUN 9 6* 11 11  CREATININE 0.73 0.72 0.74 0.81  CALCIUM 9.1 8.8* 9.2 9.4  MG  --   --   --  1.8   Liver Function Tests: Recent Labs  Lab 10/15/23 0626  AST 17  ALT 18  ALKPHOS 40  BILITOT 0.5  PROT 6.1*  ALBUMIN 2.8*   CBG: No results for input(s): GLUCAP in the last 168 hours.  Discharge time spent: greater than 30 minutes.  Signed: Marshea Wisher , MD [facility] 10/15/2023

## 2023-10-16 LAB — CULTURE, BLOOD (ROUTINE X 2)
Culture: NO GROWTH
Special Requests: ADEQUATE
Special Requests: ADEQUATE

## 2023-10-24 DIAGNOSIS — J449 Chronic obstructive pulmonary disease, unspecified: Secondary | ICD-10-CM | POA: Diagnosis not present

## 2023-10-24 DIAGNOSIS — I10 Essential (primary) hypertension: Secondary | ICD-10-CM | POA: Diagnosis not present

## 2023-10-24 DIAGNOSIS — R32 Unspecified urinary incontinence: Secondary | ICD-10-CM | POA: Diagnosis not present

## 2023-10-24 DIAGNOSIS — K5900 Constipation, unspecified: Secondary | ICD-10-CM | POA: Diagnosis not present

## 2023-10-24 DIAGNOSIS — I472 Ventricular tachycardia, unspecified: Secondary | ICD-10-CM | POA: Diagnosis not present

## 2023-10-24 DIAGNOSIS — J189 Pneumonia, unspecified organism: Secondary | ICD-10-CM | POA: Diagnosis not present

## 2023-10-24 DIAGNOSIS — A419 Sepsis, unspecified organism: Secondary | ICD-10-CM | POA: Diagnosis not present

## 2023-10-24 DIAGNOSIS — E785 Hyperlipidemia, unspecified: Secondary | ICD-10-CM | POA: Diagnosis not present

## 2023-10-24 DIAGNOSIS — K5120 Ulcerative (chronic) proctitis without complications: Secondary | ICD-10-CM | POA: Diagnosis not present

## 2023-10-24 DIAGNOSIS — R918 Other nonspecific abnormal finding of lung field: Secondary | ICD-10-CM | POA: Diagnosis not present

## 2023-10-24 DIAGNOSIS — I471 Supraventricular tachycardia, unspecified: Secondary | ICD-10-CM | POA: Diagnosis not present

## 2023-10-24 DIAGNOSIS — E876 Hypokalemia: Secondary | ICD-10-CM | POA: Diagnosis not present

## 2023-10-31 DIAGNOSIS — J189 Pneumonia, unspecified organism: Secondary | ICD-10-CM | POA: Diagnosis not present

## 2023-10-31 DIAGNOSIS — R918 Other nonspecific abnormal finding of lung field: Secondary | ICD-10-CM | POA: Diagnosis not present

## 2023-11-04 NOTE — Progress Notes (Signed)
 Cardiology Office Note   Date:  11/12/2023  ID:  [name], [name] [birth year], MRN [id number] PCP: Stephane Leita DEL, MD  [facility] Providers Cardiologist:  Stanly DELENA Leavens, MD    History of Present Illness Ashley Rivera is a 83 y.o. female with a past medical history of HTN, HLD, PACs, SVT, COPD. He is followed by Dr. Leavens and presents today for a hospital follow up appointment   Patient established care with cardiology in 2021. Underwent stress test 02/2020 that was a normal, low risk study. No perfusion defect suggestive of ischemia or infarction. Later echocardiogram in 06/2021 showed EF 62%, no regional wall motion abnormalities, normal LV diastolic parameters, normal RV systolic function, no significant valvular abnormalities. Cardiac monitor in 07/2021 showed many runs of SVT with the longest lasting 14 seconds, frequent PACs with 7% burden. Was treated with diltiazem    Patient recently admitted 6/8-6/11/25 with pneumonia. Treated with antibiotics and nebulizer treatments. While admitted, she underwent echocardiogram 6/10 that showed EF 60-65%, no regional wall motion abnormalities, grade I DD, hyperdynamic RV systolic function, no significant valvular abnormalities. Patient was referred to pulmonary due to findings of emphysema and pulmonary nodules on chest CT scan   Today, patient reports that his overall done well since getting out of the hospital.  She continues to be a bit fatigued but overall feels much better.  Reports that she had initially gone to an urgent care because she was having right sided chest pain.  While there, she was told that her heart rate was in the 200s and she was sent to the ED.  Ended up admitted and treated for pneumonia.  She denies having any palpitations when her heart rate was elevated.  She was told she might be in SVT in the ER.  EKGs showed sinus tachycardia.  She completed her treatment for pneumonia.  Since getting out of the hospital,  she has not had any palpitations or elevated heart rates at home.  She has a history of COPD and is going to establish with a pulmonologist soon.  Sometimes has some shortness of breath that improves with her breathing treatments.  Denies orthopnea or lower extremity swelling.  Denies dizziness, syncope, near syncope.  Has not had episodes of elevated heart rate at home.  She has an area of tenderness on her left chest near her left breast.  This pain is often reproducible on palpation.  Pain is not associated with exertion, position.  Not pleuritic.  Studies Reviewed  Cardiac Studies & Procedures   ______________________________________________________________________________________________   STRESS TESTS  MYOCARDIAL PERFUSION IMAGING 02/09/2020  Interpretation Summary  Nuclear stress EF: 73%. No wall motion abnormalities  There was no ST segment deviation noted during stress.  The study is normal.  Supine images during stress today reveal a subtle inferoseptal defect however this defect is not apparent on upright images. This would be suggestive of some degree of attenuation artifact.  This is a low risk study. There are no perfusion defect suggestive of ischemia or infarction.  Oneil Parchment, MD   ECHOCARDIOGRAM  ECHOCARDIOGRAM COMPLETE 10/14/2023  Narrative ECHOCARDIOGRAM REPORT    Patient Name:   Ashley Rivera [facility] Date of Exam: 10/14/2023 Medical Rec #:  [id number]     Height:       61.0 in Accession #:    [id number]    Weight:       140.4 lb Date of Birth:  1941/04/11      BSA:  1.625 m Patient Age:    83 years      BP:           124/71 mmHg Patient Gender: F             HR:           95 bpm. Exam Location:  Inpatient  Procedure: 2D Echo, Cardiac Doppler and Color Doppler (Both Spectral and Color Flow Doppler were utilized during procedure).  Indications:    Acute Respiratory distress R06.03  History:        Patient has prior history of Echocardiogram examinations,  most recent 06/28/2021. COPD; Risk Factors:Hypertension.  Sonographer:    Koleen Popper RDCS Referring Phys: [id number] JENNIFER YATES   Sonographer Comments: Image acquisition challenging due to respiratory motion and Image acquisition challenging due to COPD. IMPRESSIONS   1. Left ventricular ejection fraction, by estimation, is 60 to 65%. The left ventricle has normal function. The left ventricle has no regional wall motion abnormalities. Left ventricular diastolic parameters are consistent with Grade I diastolic dysfunction (impaired relaxation). 2. Right ventricular systolic function is hyperdynamic. The right ventricular size is not well visualized. There is normal pulmonary artery systolic pressure. 3. The mitral valve is abnormal. No evidence of mitral valve regurgitation. No evidence of mitral stenosis. 4. The aortic valve is tricuspid. Aortic valve regurgitation is not visualized. No aortic stenosis is present. 5. The inferior vena cava is normal in size with greater than 50% respiratory variability, suggesting right atrial pressure of 3 mmHg.  Comparison(s): No significant change from prior study. Prior images reviewed side by side.  FINDINGS Left Ventricle: Left ventricular ejection fraction, by estimation, is 60 to 65%. The left ventricle has normal function. The left ventricle has no regional wall motion abnormalities. The left ventricular internal cavity size was normal in size. There is no left ventricular hypertrophy. Left ventricular diastolic parameters are consistent with Grade I diastolic dysfunction (impaired relaxation).  Right Ventricle: The right ventricular size is not well visualized. No increase in right ventricular wall thickness. Right ventricular systolic function is hyperdynamic. There is normal pulmonary artery systolic pressure. The tricuspid regurgitant velocity is 2.25 m/s, and with an assumed right atrial pressure of 3 mmHg, the estimated right ventricular  systolic pressure is 23.2 mmHg.  Left Atrium: Left atrial size was normal in size.  Right Atrium: Right atrial size was normal in size.  Pericardium: There is no evidence of pericardial effusion.  Mitral Valve: The mitral valve is abnormal. No evidence of mitral valve regurgitation. No evidence of mitral valve stenosis.  Tricuspid Valve: The tricuspid valve is grossly normal. Tricuspid valve regurgitation is not demonstrated. No evidence of tricuspid stenosis.  Aortic Valve: The aortic valve is tricuspid. Aortic valve regurgitation is not visualized. No aortic stenosis is present.  Pulmonic Valve: The pulmonic valve was normal in structure. Pulmonic valve regurgitation is not visualized. No evidence of pulmonic stenosis.  Aorta: The aortic root and ascending aorta are structurally normal, with no evidence of dilitation.  Venous: The inferior vena cava is normal in size with greater than 50% respiratory variability, suggesting right atrial pressure of 3 mmHg.  IAS/Shunts: The interatrial septum was not well visualized.   LEFT VENTRICLE PLAX 2D LVIDd:         3.90 cm   Diastology LVIDs:         2.50 cm   LV e' medial:    7.83 cm/s LV PW:         0.80  cm   LV E/e' medial:  7.3 LV IVS:        0.90 cm   LV e' lateral:   9.36 cm/s LVOT diam:     2.00 cm   LV E/e' lateral: 6.1 LV SV:         65 LV SV Index:   40 LVOT Area:     3.14 cm   RIGHT VENTRICLE             IVC RV S prime:     22.80 cm/s  IVC diam: 1.85 cm TAPSE (M-mode): 2.4 cm  LEFT ATRIUM             Index        RIGHT ATRIUM          Index LA diam:        3.50 cm 2.15 cm/m   RA Area:     8.92 cm LA Vol (A2C):   21.6 ml 13.29 ml/m  RA Volume:   17.90 ml 11.01 ml/m LA Vol (A4C):   23.2 ml 14.27 ml/m LA Biplane Vol: 23.5 ml 14.46 ml/m AORTIC VALVE LVOT Vmax:   102.00 cm/s LVOT Vmean:  67.100 cm/s LVOT VTI:    0.206 m  AORTA Ao Root diam: 2.90 cm Ao Asc diam:  3.30 cm  MITRAL VALVE               TRICUSPID  VALVE MV Area (PHT): 3.17 cm    TR Peak grad:   20.2 mmHg MV Decel Time: 239 msec    TR Vmax:        225.00 cm/s MV E velocity: 57.10 cm/s MV A velocity: 87.50 cm/s  SHUNTS MV E/A ratio:  0.65        Systemic VTI:  0.21 m Systemic Diam: 2.00 cm  Stanly Leavens MD Electronically signed by Stanly Leavens MD Signature Date/Time: 10/14/2023/5:14:15 PM    Final    MONITORS  LONG TERM MONITOR (3-14 DAYS) 07/19/2021  Narrative  Patient had a minimum heart rate of 63 bpm, maximum heart rate of 200 bpm, and average heart rate of 82 bpm.  Predominant underlying rhythm was sinus rhythm.  Many runs of SVT occurred lasting 14 seconds at longest with a max rate of 200 bpm at fastest.  Isolated PACs were frequent (7%).  Triggered and diary events associated with sinus rhythm, PACs, and PVCs.  Asymptomatic SVT.       ______________________________________________________________________________________________       Risk Assessment/Calculations           Physical Exam VS:  BP 108/62   Pulse 91   Ht 5' 1 (1.549 m)   Wt 136 lb 11.2 oz (62 kg)   SpO2 96%   BMI 25.83 kg/m        Wt Readings from Last 3 Encounters:  11/12/23 136 lb 11.2 oz (62 kg)  10/12/23 140 lb 6.9 oz (63.7 kg)  09/10/23 140 lb (63.5 kg)    GEN: Well nourished, well developed in no acute distress. Sitting comfortably on the exam table  NECK: No JVD  CARDIAC:  RRR, no murmurs, rubs, gallops. Radial pulses 2+ bilaterally  RESPIRATORY:  breathing unlabored, lungs clear to auscultation throughout   ABDOMEN: Soft, non-tender, non-distended EXTREMITIES:  No edema in BLE; No deformity   ASSESSMENT AND PLAN  SVT  PACs  - Previous cardiac monitor in 07/2021 showed many runs of SVT with the longest lasting 14 seconds, frequent PACs with 7%  burden - When in the ED with pneumonia, patient had SVT. Denies palpitations, elevated HR since then. Discussed that infection can trigger SVT - Encouraged  her to make sure she is staying hydrated and avoiding caffeine  - Offered cardiac monitor, but discussed that this is not absolutely necessary as she is not having palpitations or tachycardia at home. We agreed to hold off for now  - continue diltiazem  120 mg daily   HTN  - BP well controlled. Denies dizziness  - Continue chlorthalidone  25 mg daily, diltiazem  120 mg daily - K 3.8 and creatinine 0.81 on 10/15/23   HLD  - Lipid panel from 06/2023 showed LDL 83 - Continue zetia  10 mg daily   Former tobacco abuse  COPD  Pulmonary nodules  - CTA chest from 10/12/23 showed no PE, scattered nodular ground-glass and subsolid opacities measuring up to 0.6 cm. Also noted empysema and diffuse bilateral bronchial wall thickening  - Reports that breathing has overall been stable, but could be better. She is hoping to have her inhalers adjusted at her upcoming pulmonology appointment   Coronary calcifications  - Noted on Chest CTA 10/2023  - Denies chest pain on exertion. She does have an area of soreness on her left chest. Discomfort is not exertional, positional, or pleuritic. Reproducible on palpation. Musculoskeletal, no indication for ischemic work up at this time  - Echocardiogram 10/14/23 showed EF 60-65%, no wall motion abnormalities, grade I DD, no significant valvular abnormalities  - Continue zetia  10 mg daily    Dispo: Follow up with Dr. Santo as scheduled in 04/2024   Signed, Rollo FABIENE Louder, PA-C

## 2023-11-10 ENCOUNTER — Other Ambulatory Visit: Payer: Self-pay | Admitting: Internal Medicine

## 2023-11-12 ENCOUNTER — Ambulatory Visit: Attending: Cardiology | Admitting: Cardiology

## 2023-11-12 ENCOUNTER — Encounter: Payer: Self-pay | Admitting: Cardiology

## 2023-11-12 VITALS — BP 108/62 | HR 91 | Ht 61.0 in | Wt 136.7 lb

## 2023-11-12 DIAGNOSIS — I10 Essential (primary) hypertension: Secondary | ICD-10-CM

## 2023-11-12 DIAGNOSIS — J449 Chronic obstructive pulmonary disease, unspecified: Secondary | ICD-10-CM

## 2023-11-12 DIAGNOSIS — I491 Atrial premature depolarization: Secondary | ICD-10-CM | POA: Diagnosis not present

## 2023-11-12 DIAGNOSIS — I2510 Atherosclerotic heart disease of native coronary artery without angina pectoris: Secondary | ICD-10-CM

## 2023-11-12 DIAGNOSIS — I471 Supraventricular tachycardia, unspecified: Secondary | ICD-10-CM | POA: Diagnosis not present

## 2023-11-12 NOTE — Patient Instructions (Signed)
 Medication Instructions:  No changes *If you need a refill on your cardiac medications before your next appointment, please call your pharmacy*  Lab Work: No labs  Testing/Procedures: No testing  Follow-Up: At [facility], you and your health needs are our priority.  As part of our continuing mission to provide you with exceptional heart care, our providers are all part of one team.  This team includes your primary Cardiologist (physician) and Advanced Practice Providers or APPs (Physician Assistants and Nurse Practitioners) who all work together to provide you with the care you need, when you need it.  Your next appointment:   Rescheduled the September appointment to December  Provider:   Stanly DELENA Leavens, MD    We recommend signing up for the patient portal called MyChart.  Sign up information is provided on this After Visit Summary.  MyChart is used to connect with patients for Virtual Visits (Telemedicine).  Patients are able to view lab/test results, encounter notes, upcoming appointments, etc.  Non-urgent messages can be sent to your provider as well.   To learn more about what you can do with MyChart, go to [email or web address].

## 2023-11-17 ENCOUNTER — Ambulatory Visit: Admitting: Acute Care

## 2023-11-17 ENCOUNTER — Encounter: Payer: Self-pay | Admitting: Acute Care

## 2023-11-17 VITALS — BP 113/85 | HR 77 | Ht 61.0 in | Wt 140.2 lb

## 2023-11-17 DIAGNOSIS — Z87891 Personal history of nicotine dependence: Secondary | ICD-10-CM

## 2023-11-17 DIAGNOSIS — R918 Other nonspecific abnormal finding of lung field: Secondary | ICD-10-CM | POA: Diagnosis not present

## 2023-11-17 DIAGNOSIS — J42 Unspecified chronic bronchitis: Secondary | ICD-10-CM | POA: Diagnosis not present

## 2023-11-17 DIAGNOSIS — R0609 Other forms of dyspnea: Secondary | ICD-10-CM

## 2023-11-17 NOTE — Patient Instructions (Addendum)
 It is good to see you today. We will do a CT Chest in 3 months to re-evaluate the lung nodules. This will be due 01/2024. You will get a call to get this scheduled closer to the time. Continue Breztri , 2 puffs in the morning, 2 puffs in the evening. Rinse mouth after use. Try the spacer I gave you to see if you feel you are getting additional benefit from the inhalers. Continue using the albuterol  as needed for breakthrough shortness of breath or wheezing.  Monitor your oxygen saturations . Goal is always to be greater than 88%. I have ordered PFT's . You will get  a call to get these scheduled. Follow up after CT chest and PFT's to review results. We may be able to send you to Pulmonary rehab, if your PFT's show COPD.  Note your daily symptoms > remember red flags for COPD:  Increase in cough, increase in sputum production, increase in shortness of breath or activity intolerance. If you notice these symptoms, please call to be seen.    Please contact office for sooner follow up if symptoms do not improve or worsen or seek emergency care

## 2023-11-17 NOTE — Progress Notes (Signed)
 History of Present Illness Ashley Rivera is a 83 y.o. female former smoker with COPD referred for abnormal chest imaging noted on CTA while patient was hospitalized for pneumonia.She will be followed by Dr. Shelah.   11/17/2023 Pt. Presents for follow up after ED visit with admission for pulmonary nodules found incidentally when she went to the ED for shortness of breath and increased heart rate. She also has chronic bronchitis and COPD that is managed by her PCP.She is a former smoker quit 2009 , smoked 1/2 PPD x 31 years. She also had second hand smoke exposure as a child, both parents smoked. Grandmother had colon cancer, Mother developed cancer later in life.    Pt. Is currently on Trelegy per her PCP. She does not have PFT's or spirometry in [facility].  We have reviewed the patient's CTA Chest done in the ED 10/12/2023. Scan was read as  scattered nodular ground-glass and subsolid opacities throughout the lungs, measuring up to 0.6 cm. Small irregular consolidations in the medial right upper lobe. Findings were most likely infectious or inflammatory, but short term Non-contrast chest CT at 3-6 months was recommended.   She was admitted 10/12/2023 and treated for her CAP and sepsis 2/2 pneumonia.Pt received Levofloxacin  750 mg PO q48 hr, Mucinex  600 mg PO bid, Breztri  2 puff bid and Duoneb q4 hr PRN. WBC downtrended from 14 -->5.8.  She was able to come off of the oxygen and she was on room air on the morning of 10/15/2023. She requested discharge home on 10/15/2023. She was discharged with scripts for PO levofloxacin  and breztri  and pulmonary referral.   Pt. States she does experience shortness of breath on a daily basis. She has been using her Breztri  as prescribed since her hospitalization, but prior to that she did not use it as an every day maintenance inhaler.   We will also do PFT's to stage her PFT's and look closely at her diffusion capacity. She does check her oxygen sats at home and  understands they need to be > 88-90% at all times.   She does have emphysema, she had CAD and is followed by cardiology.   Her family history is significant for cancer, with her grandmother having had colon cancer, her aunt breast cancer, and her mother lung cancer and emphysema.    Test Results: CTA 10/12/2023 Negative examination for pulmonary embolism. 2. Scattered nodular ground-glass and subsolid opacities throughout the lungs, measuring up to 0.6 cm. Small irregular consolidations in the medial right upper lobe. Findings are most likely infectious or inflammatory, however nodules warrant short-term follow-up to ensure stability. Non-contrast chest CT at 3-6 months is recommended. If nodules persist, subsequent management will be based upon the most suspicious nodule(s). This recommendation follows the consensus statement: Guidelines for Management of Incidental Pulmonary Nodules Detected on CT Images: From the Fleischner Society 2017; Radiology 2017; 284:228-243. 3. Emphysema and diffuse bilateral bronchial wall thickening. 4. Coronary artery disease. 5. Moderate hiatal hernia. 6. Mildly coarse contour of the liver, suggestive of cirrhosis. Partially imaged splenomegaly. Correlate with biochemical findings.       Latest Ref Rng & Units 10/15/2023    6:26 AM 10/14/2023    6:03 AM 10/13/2023    6:08 AM  CBC  WBC 4.0 - 10.5 K/uL 5.8  7.9  12.7   Hemoglobin 12.0 - 15.0 g/dL 87.4  87.6  86.6   Hematocrit 36.0 - 46.0 % 39.1  37.9  40.4   Platelets 150 - 400 K/uL  140  125  134        Latest Ref Rng & Units 10/15/2023    6:26 AM 10/14/2023    6:03 AM 10/13/2023    6:08 AM  BMP  Glucose 70 - 99 mg/dL 894  98  894   BUN 8 - 23 mg/dL 11  11  6    Creatinine 0.44 - 1.00 mg/dL 9.18  9.25  9.27   Sodium 135 - 145 mmol/L 137  138  138   Potassium 3.5 - 5.1 mmol/L 3.8  4.4  3.2   Chloride 98 - 111 mmol/L 97  101  99   CO2 22 - 32 mmol/L 33  29  27   Calcium 8.9 - 10.3 mg/dL 9.4  9.2   8.8     BNP No results found for: BNP  ProBNP No results found for: PROBNP  PFT No results found for: FEV1PRE, FEV1POST, FVCPRE, FVCPOST, TLC, DLCOUNC, PREFEV1FVCRT, PSTFEV1FVCRT  No results found.   Past medical hx Past Medical History:  Diagnosis Date   Allergy    Asthma    bronchial asthma   Cataract    left eye   Complication of anesthesia    Became congested after colonoscopy   COPD (chronic obstructive pulmonary disease) (HCC)    Diverticulosis    Emphysema of lung (HCC)    Genital warts    Dx about 30 years ago    GERD (gastroesophageal reflux disease)    Hemorrhoids    History of hiatal hernia    History of kidney stones    Hx of colonic polyp    Hx: UTI (urinary tract infection)    Hypertension    Osteoporosis    Pneumonia    Proctitis 11/12/2011   Skin cancer    basal cell   Urinary incontinence    On occasion     Social History   Tobacco Use   Smoking status: Former    Current packs/day: 0.00    Types: Cigarettes    Start date: 10/04/1977    Quit date: 10/05/2007    Years since quitting: 16.1   Smokeless tobacco: Never  Vaping Use   Vaping status: Never Used  Substance Use Topics   Alcohol  use: Yes    Comment: occasional wine   Drug use: No    AshleyRivera reports that she quit smoking about 16 years ago. Her smoking use included cigarettes. She started smoking about 46 years ago. She has never used smokeless tobacco. She reports current alcohol  use. She reports that she does not use drugs.  Tobacco Cessation: Counseling given: Not Answered Former smoker , smoked x 30 years about 1/2 PPD.  Past surgical hx, Family hx, Social hx all reviewed.  Current Outpatient Medications on File Prior to Visit  Medication Sig   acetaminophen  (TYLENOL ) 500 MG tablet Take 1,000 mg by mouth as needed for mild pain (pain score 1-3) or moderate pain (pain score 4-6).   aspirin  EC 325 MG tablet Take 325 mg by mouth as needed for moderate  pain (pain score 4-6) or mild pain (pain score 1-3).   calcium carbonate (TUMS - DOSED IN MG ELEMENTAL CALCIUM) 500 MG chewable tablet Chew 1 tablet by mouth as needed for indigestion or heartburn.   carboxymethylcellulose (REFRESH PLUS) 0.5 % SOLN Place 2 drops into both eyes 2 (two) times daily as needed (dry/irritated eyes).   cetirizine (ZYRTEC) 10 MG tablet Take 10 mg by mouth daily as needed for allergies.  chlorthalidone  (HYGROTON ) 25 MG tablet Take 25 mg by mouth every other day.   Cholecalciferol (VITAMIN D3) 50 MCG (2000 UT) TABS Take 2,000 Units by mouth in the morning.   Cyanocobalamin  (VITAMIN B12 PO) Take 1 tablet by mouth in the morning. daily   diltiazem  (CARDIZEM  CD) 120 MG 24 hr capsule TAKE 1 CAPSULE BY MOUTH DAILY   ezetimibe  (ZETIA ) 10 MG tablet Take 10 mg by mouth in the morning.   ferrous sulfate 325 (65 FE) MG EC tablet Take 325 mg by mouth in the morning.   FOLIC ACID  PO Take 1 tablet by mouth in the morning.   MELATONIN PO Take 8 mg by mouth at bedtime.   Omega-3 Fatty Acids (FISH OIL ULTRA) 1400 MG CAPS Take 1,400 mg by mouth at bedtime.   sulfaSALAzine  (AZULFIDINE ) 500 MG EC tablet Take 2 tablets (1,000 mg total) by mouth 2 (two) times daily. (Patient taking differently: Take 1,000 mg by mouth at bedtime.)   VENTOLIN  HFA 108 (90 BASE) MCG/ACT inhaler Inhale 1 puff into the lungs as needed for wheezing or shortness of breath.    Wheat Dextrin (BENEFIBER PO) Take 1 Dose by mouth in the morning.   zoledronic  acid (RECLAST ) 5 MG/100ML SOLN injection Inject 5 mg into the vein once. yearly   No current facility-administered medications on file prior to visit.     Allergies  Allergen Reactions   Cephalexin      Other reaction(s): rash on chest/hives    Review Of Systems:  Constitutional:   No  weight loss, night sweats,  Fevers, chills, fatigue, or  lassitude.  HEENT:   No headaches,  Difficulty swallowing,  Tooth/dental problems, or  Sore throat,                 No sneezing, itching, ear ache, nasal congestion, post nasal drip,   CV:  No chest pain,  Orthopnea, PND, swelling in lower extremities, anasarca, dizziness, palpitations, syncope.   GI  No heartburn, indigestion, abdominal pain, nausea, vomiting, diarrhea, change in bowel habits, loss of appetite, bloody stools.   Resp: + shortness of breath with exertion less at rest.  No excess mucus, no productive cough,  No non-productive cough,  No coughing up of blood.  No change in color of mucus.  No wheezing.  No chest wall deformity  Skin: no rash or lesions.  GU: no dysuria, change in color of urine, no urgency or frequency.  No flank pain, no hematuria   MS:  No joint pain or swelling.  + Decreased range of motion.  No back pain.  Psych:  No change in mood or affect. No depression or anxiety.  No memory loss.   Vital Signs BP 113/85 (BP Location: Left Arm, Patient Position: Sitting, Cuff Size: Normal)   Pulse 77   Ht 5' 1 (1.549 m)   Wt 140 lb 3.2 oz (63.6 kg)   SpO2 94%   BMI 26.49 kg/m    Physical Exam:  General- No distress,  A&Ox3, pleasant ENT: No sinus tenderness, TM clear, pale nasal mucosa, no oral exudate,no post nasal drip, no LAN Cardiac: S1, S2, regular rate and rhythm, no murmur Chest: No wheeze/ rales/ dullness; no accessory muscle use, no nasal flaring, no sternal retractions, slightly diminished per bases Abd.: Soft Non-tender, ND, BS +, Body mass index is 26.49 kg/m.  Ext: No clubbing cyanosis, edema, no obvious deformities Neuro:  normal strength, MAE x 4, A&O x 3 , appropriate Skin: No rashes,  warm and dry, No obvious skin lesions  Psych: normal mood and behavior   Assessment/Plan Chronic Obstructive Pulmonary Disease (COPD) COPD managed with Breztri . Non-compliance noted. Baseline dyspnea present, less severe than during pneumonia. - Continue Breztri , two puffs morning and evening. - Rinse mouth after Breztri  use. - Use spacer with inhaler. Provided  today in the office - Continue albuterol  as needed for dyspnea or wheezing. - Order pulmonary function tests to assess severity. - Educated on COPD exacerbation red flags: increased cough, sputum, dyspnea, activity intolerance.  Chronic Bronchitis Chronic bronchitis managed with COPD regimen.  Emphysema Moderate centrilobular and paraseptal emphysema on CT.  No acute exacerbation. Plan Monitor  Pneumonia June CAP pneumonia episode resolved.  Improved condition noted. Plan Follow up CT Chest 01/2024  Ground Glass Opacities and Nodules Scattered ground glass opacities and nodules on CT, likely infectious or inflammatory. - Order repeat CT chest in early September. - Ensure CT at [facility]. - Schedule follow-up after CT and pulmonary function tests. - Instructed to call if not contacted for scheduling.  Coronary Artery Disease Coronary artery disease with plaques noted on imaging. Follow up with cardiology  I spent 40 minutes dedicated to the care of this patient on the date of this encounter to include pre-visit review of records, face-to-face time with the patient discussing conditions above, post visit ordering of testing, clinical documentation with the electronic health record, making appropriate referrals as documented, and communicating necessary information to the patient's healthcare team.    Lauraine JULIANNA Lites, NP 11/17/2023  8:57 AM

## 2023-12-08 DIAGNOSIS — D485 Neoplasm of uncertain behavior of skin: Secondary | ICD-10-CM | POA: Diagnosis not present

## 2023-12-08 DIAGNOSIS — L570 Actinic keratosis: Secondary | ICD-10-CM | POA: Diagnosis not present

## 2023-12-08 DIAGNOSIS — D1801 Hemangioma of skin and subcutaneous tissue: Secondary | ICD-10-CM | POA: Diagnosis not present

## 2023-12-08 DIAGNOSIS — D3613 Benign neoplasm of peripheral nerves and autonomic nervous system of lower limb, including hip: Secondary | ICD-10-CM | POA: Diagnosis not present

## 2023-12-08 DIAGNOSIS — L814 Other melanin hyperpigmentation: Secondary | ICD-10-CM | POA: Diagnosis not present

## 2023-12-08 DIAGNOSIS — L820 Inflamed seborrheic keratosis: Secondary | ICD-10-CM | POA: Diagnosis not present

## 2023-12-08 DIAGNOSIS — D225 Melanocytic nevi of trunk: Secondary | ICD-10-CM | POA: Diagnosis not present

## 2023-12-08 DIAGNOSIS — L905 Scar conditions and fibrosis of skin: Secondary | ICD-10-CM | POA: Diagnosis not present

## 2023-12-08 DIAGNOSIS — L821 Other seborrheic keratosis: Secondary | ICD-10-CM | POA: Diagnosis not present

## 2023-12-08 DIAGNOSIS — Z85828 Personal history of other malignant neoplasm of skin: Secondary | ICD-10-CM | POA: Diagnosis not present

## 2023-12-09 NOTE — Telephone Encounter (Signed)
 Can we schedule her Ct scan at [facility] in early mid September please

## 2023-12-16 NOTE — Telephone Encounter (Signed)
 Scheduled CT scan and follow-up. Called Mrs. Nordmann left a detailed voice message of appointment. Printed and mailed reminder.

## 2023-12-29 DIAGNOSIS — L988 Other specified disorders of the skin and subcutaneous tissue: Secondary | ICD-10-CM | POA: Diagnosis not present

## 2023-12-29 DIAGNOSIS — D485 Neoplasm of uncertain behavior of skin: Secondary | ICD-10-CM | POA: Diagnosis not present

## 2024-01-02 DIAGNOSIS — N200 Calculus of kidney: Secondary | ICD-10-CM | POA: Diagnosis not present

## 2024-01-02 DIAGNOSIS — N281 Cyst of kidney, acquired: Secondary | ICD-10-CM | POA: Diagnosis not present

## 2024-01-06 ENCOUNTER — Ambulatory Visit: Admitting: Internal Medicine

## 2024-01-06 DIAGNOSIS — Z87891 Personal history of nicotine dependence: Secondary | ICD-10-CM

## 2024-01-06 LAB — PULMONARY FUNCTION TEST
DL/VA % pred: 84 %
DL/VA: 3.48 ml/min/mmHg/L
DLCO cor % pred: 69 %
DLCO cor: 12.09 ml/min/mmHg
DLCO unc % pred: 69 %
DLCO unc: 12.09 ml/min/mmHg
FEF 25-75 Post: 0.53 L/s
FEF 25-75 Pre: 0.36 L/s
FEF2575-%Change-Post: 45 %
FEF2575-%Pred-Post: 46 %
FEF2575-%Pred-Pre: 31 %
FEV1-%Change-Post: 13 %
FEV1-%Pred-Post: 56 %
FEV1-%Pred-Pre: 49 %
FEV1-Post: 0.93 L
FEV1-Pre: 0.82 L
FEV1FVC-%Change-Post: 2 %
FEV1FVC-%Pred-Pre: 68 %
FEV6-%Change-Post: 12 %
FEV6-%Pred-Post: 84 %
FEV6-%Pred-Pre: 75 %
FEV6-Post: 1.78 L
FEV6-Pre: 1.58 L
FEV6FVC-%Change-Post: 0 %
FEV6FVC-%Pred-Post: 104 %
FEV6FVC-%Pred-Pre: 104 %
FVC-%Change-Post: 11 %
FVC-%Pred-Post: 80 %
FVC-%Pred-Pre: 72 %
FVC-Post: 1.81 L
FVC-Pre: 1.62 L
Post FEV1/FVC ratio: 51 %
Post FEV6/FVC ratio: 98 %
Pre FEV1/FVC ratio: 50 %
Pre FEV6/FVC Ratio: 98 %
RV % pred: 198 %
RV: 4.67 L
TLC % pred: 140 %
TLC: 6.67 L

## 2024-01-06 NOTE — Patient Instructions (Signed)
 Full PFT performed today.

## 2024-01-06 NOTE — Progress Notes (Signed)
 Full PFT performed today.

## 2024-01-08 DIAGNOSIS — M810 Age-related osteoporosis without current pathological fracture: Secondary | ICD-10-CM | POA: Diagnosis not present

## 2024-01-08 DIAGNOSIS — D329 Benign neoplasm of meninges, unspecified: Secondary | ICD-10-CM | POA: Diagnosis not present

## 2024-01-08 DIAGNOSIS — Z23 Encounter for immunization: Secondary | ICD-10-CM | POA: Diagnosis not present

## 2024-01-08 DIAGNOSIS — E785 Hyperlipidemia, unspecified: Secondary | ICD-10-CM | POA: Diagnosis not present

## 2024-01-08 DIAGNOSIS — J449 Chronic obstructive pulmonary disease, unspecified: Secondary | ICD-10-CM | POA: Diagnosis not present

## 2024-01-08 DIAGNOSIS — Z87891 Personal history of nicotine dependence: Secondary | ICD-10-CM | POA: Diagnosis not present

## 2024-01-08 DIAGNOSIS — I10 Essential (primary) hypertension: Secondary | ICD-10-CM | POA: Diagnosis not present

## 2024-01-08 DIAGNOSIS — I471 Supraventricular tachycardia, unspecified: Secondary | ICD-10-CM | POA: Diagnosis not present

## 2024-01-12 ENCOUNTER — Ambulatory Visit
Admission: RE | Admit: 2024-01-12 | Discharge: 2024-01-12 | Disposition: A | Discharge status: Home / Self Care | Source: Ambulatory Visit | Attending: Acute Care | Admitting: Acute Care

## 2024-01-12 DIAGNOSIS — J984 Other disorders of lung: Secondary | ICD-10-CM | POA: Diagnosis not present

## 2024-01-12 DIAGNOSIS — R918 Other nonspecific abnormal finding of lung field: Secondary | ICD-10-CM

## 2024-01-12 DIAGNOSIS — J439 Emphysema, unspecified: Secondary | ICD-10-CM | POA: Diagnosis not present

## 2024-01-20 ENCOUNTER — Encounter: Payer: Self-pay | Admitting: Acute Care

## 2024-01-20 ENCOUNTER — Ambulatory Visit (INDEPENDENT_AMBULATORY_CARE_PROVIDER_SITE_OTHER): Admitting: Acute Care

## 2024-01-20 VITALS — BP 122/66 | HR 88 | Temp 97.7°F | Ht 61.0 in | Wt 138.0 lb

## 2024-01-20 DIAGNOSIS — R9389 Abnormal findings on diagnostic imaging of other specified body structures: Secondary | ICD-10-CM | POA: Diagnosis not present

## 2024-01-20 DIAGNOSIS — R911 Solitary pulmonary nodule: Secondary | ICD-10-CM

## 2024-01-20 DIAGNOSIS — J441 Chronic obstructive pulmonary disease with (acute) exacerbation: Secondary | ICD-10-CM | POA: Diagnosis not present

## 2024-01-20 DIAGNOSIS — Z87891 Personal history of nicotine dependence: Secondary | ICD-10-CM

## 2024-01-20 DIAGNOSIS — J439 Emphysema, unspecified: Secondary | ICD-10-CM

## 2024-01-20 DIAGNOSIS — J181 Lobar pneumonia, unspecified organism: Secondary | ICD-10-CM

## 2024-01-20 NOTE — Patient Instructions (Signed)
 It is great to see you today. We have reviewed your CT scan which shows improvement over previous scans. There are still some areas that we want to follow until complete resolution. We will do a CT scan in 6 months, which will be March 2026. You will get a call to get this scheduled. You will follow-up with me 1 to 2 weeks after the scan so we can review the results. We have reviewed your pulmonary function testing. This shows severe COPD, with a mild diffusion defect. Continue taking your Breztri  2 puffs twice daily as you have been doing. Rinse mouth after use. Use albuterol  as needed for breakthrough shortness of breath or wheezing. Note your daily symptoms > remember red flags for COPD:  Increase in cough, increase in sputum production, increase in shortness of breath or activity intolerance. If you notice these symptoms, please call to be seen.  Please contact office for sooner follow up if symptoms do not improve or worsen or seek emergency care   Have a great holiday season.

## 2024-01-20 NOTE — Progress Notes (Signed)
 History of Present Illness Ashley Rivera is a 83 y.o. female female former smoker with COPD referred for abnormal chest imaging noted on CTA while patient was hospitalized for pneumonia.She will be followed by Dr. Shelah.   PMH Includes Chronic bronchitis and COPD that is managed by her PCP.She is a former smoker quit 2009 , smoked 1/2 PPD x 31 years. She also had second hand smoke exposure as a child, both parents smoked.  Grandmother had colon cancer, Mother developed cancer later in life.   Recent admission 10/12/2023 and treated for her CAP and sepsis 2/2 pneumonia.Pt received Levofloxacin  750 mg PO q48 hr, Mucinex  600 mg PO bid, Breztri  2 puff bid and Duoneb q4 hr PRN. Here today for 3 month follow up Ct Chest to ensure she continues to get better.   Maintenance is Breztri    01/20/2024 Discussed the use of AI scribe software for clinical note transcription with the patient, who gave verbal consent to proceed.  History of Present Illness Ashley Rivera is an 83 year old female with severe COPD who presents for a follow-up after a CT scan and pulmonary function tests.  She is following up after being ill in June, with a CT scan showing continued improvement of the previously diagnosed pneumonia. The scan revealed stable to improved ground glass nodules, possibly due to an infectious or inflammatory process, with a recommendation for continued follow-up to ensure complete resolution. She feels back to her baseline and denies any choking on food. Plan will be for a 6 month follow up to ensure continued resolution. She denies any fever, discolored secretions, hemoptysis or unintentional weight loss.   She has a history of COPD and emphysema. We have reviewed her PFT's . They showed severe obstruction, mixed type,  hyperinflation and airway trapping, Insignificant BD response, and mild diffusion reduction.She reports that nobody has ever told her her COPD is severe .  She experiences long-standing  shortness of breath but states it is not worse than her baseline. She is not on oxygen and uses Breztri , two puffs twice daily, and albuterol  as needed, which she reports not using very much. No cough or fever. No chest pain, which is her usual symptom of pneumonia.  She received a pneumonia shot and is unsure about the timing of her last vaccine, but recalls being told she was 'good for life' on that.       Test Results: CT chest 01/12/2024 Right lower lobe ground-glass bronchocentric nodule measuring 12 mm. (8/65). Additional scattered pulmonary micro nodules for example left upper lobe measuring 4 mm (8/26), right lower lobe perifissural measuring 9 mm (8/86), right upper lobe 8/20. Multiple foci of mucus stasis and additional pulmonary micro nodules for example right lower lobe (8/82). These nodules are stable to prior. Some of the left lower lobe and anterior right upper lobe ground-glass nodules are resolved. No new nodule. Upper lobe predominant mild centrilobular emphysematous changes.   Moderate bronchial and bronchiolar wall thickening suggestive of medium and large size airway disease. Large hiatal hernia containing proximal stomach measuring approximately 5.7 cm.    Stable to improved ground-glass nodules, possibly due to with infectious/inflammatory process. Remainder of the nodules and foci of mucus stasis are unchanged. Recommend continued attention on follow-up to ensure complete resolution.   Bronchial and bronchiolar wall thickening  PFT 01/06/2024                Latest Ref Rng & Units 10/15/2023    6:26 AM  10/14/2023    6:03 AM 10/13/2023    6:08 AM  CBC  WBC 4.0 - 10.5 K/uL 5.8  7.9  12.7   Hemoglobin 12.0 - 15.0 g/dL 87.4  87.6  86.6   Hematocrit 36.0 - 46.0 % 39.1  37.9  40.4   Platelets 150 - 400 K/uL 140  125  134        Latest Ref Rng & Units 10/15/2023    6:26 AM 10/14/2023    6:03 AM 10/13/2023    6:08 AM  BMP  Glucose 70 - 99 mg/dL 894   98  894   BUN 8 - 23 mg/dL 11  11  6    Creatinine 0.44 - 1.00 mg/dL 9.18  9.25  9.27   Sodium 135 - 145 mmol/L 137  138  138   Potassium 3.5 - 5.1 mmol/L 3.8  4.4  3.2   Chloride 98 - 111 mmol/L 97  101  99   CO2 22 - 32 mmol/L 33  29  27   Calcium 8.9 - 10.3 mg/dL 9.4  9.2  8.8     BNP No results found for: BNP  ProBNP No results found for: PROBNP  PFT    Component Value Date/Time   FEV1PRE 0.82 01/06/2024 0912   FEV1POST 0.93 01/06/2024 0912   FVCPRE 1.62 01/06/2024 0912   FVCPOST 1.81 01/06/2024 0912   TLC 6.67 01/06/2024 0912   DLCOUNC 12.09 01/06/2024 0912   PREFEV1FVCRT 50 01/06/2024 0912   PSTFEV1FVCRT 51 01/06/2024 0912    CT CHEST WO CONTRAST Result Date: 01/19/2024 CLINICAL DATA:  Pulmonary nodules EXAM: CT CHEST WITHOUT CONTRAST TECHNIQUE: Multidetector CT imaging of the chest was performed following the standard protocol without IV contrast. RADIATION DOSE REDUCTION: This exam was performed according to the departmental dose-optimization program which includes automated exposure control, adjustment of the mA and/or kV according to patient size and/or use of iterative reconstruction technique. COMPARISON:  CT angio chest October 12, 2023 FINDINGS: Cardiovascular: The heart size is normal. Atherosclerotic calcifications of coronary arteries. No pericardial Mediastinum/Nodes: Subcentimeter mediastinal lymph nodes index node measuring 9 mm in precarinal. Lungs/Pleura: Right lower lobe ground-glass bronchocentric nodule measuring 12 mm. (8/65). Additional scattered pulmonary micro nodules for example left upper lobe measuring 4 mm (8/26), right lower lobe perifissural measuring 9 mm (8/86), right upper lobe 8/20. Multiple foci of mucus stasis and additional pulmonary micro nodules for example right lower lobe (8/82). These nodules are stable to prior. Some of the left lower lobe and anterior right upper lobe ground-glass nodules are resolved. No new nodule. Upper lobe predominant  mild centrilobular emphysematous changes. Moderate bronchial and bronchiolar wall thickening suggestive of medium and large size airway disease. Large hiatal hernia containing proximal stomach measuring approximately 5.7 cm. Right posterolateral 9 mm tracheal diverticulum. Upper Abdomen: Colonic diverticulosis without diverticulitis. Musculoskeletal: Multilevel degenerative changes of the spine. IMPRESSION: Stable to improved ground-glass nodules, possibly due to with infectious/inflammatory process. Remainder of the nodules and foci of mucus stasis are unchanged. Recommend continued attention on follow-up to ensure complete resolution. Bronchial and bronchiolar wall thickening suggestive of medium and size airway disease. Fleischner subsolid 2017 Fleischner Society 2017 Guidelines for Management of Incidentally Detected Pulmonary subsolid nodules in Adults Ground glass : <23mm: No routine follow-up >= 6mm: CT at 6-12 months to confirm persistence, then CT every 2 years until 5 years In certain suspicious nodules < 6 mm, consider follow-up at 2 and 4 years. If solid component(s) or growth develops, consider  resection. (Recommendations 3A and 4A) Note.-These recommendations do not apply to lung cancer screening, patients with immunosuppression, or patients with known primary cancer. https://urldefense.com/v3/__https://pubs.InstantPosition[employer].nl.7982838340__[id number]$ Electronically Signed   By: Duwaine Severs M.D.   On: 01/19/2024 18:13     Past medical hx Past Medical History:  Diagnosis Date   Allergy    Asthma    bronchial asthma   Cataract    left eye   Complication of anesthesia    Became congested after colonoscopy   COPD (chronic obstructive pulmonary disease) (HCC)    Diverticulosis    Emphysema of lung (HCC)    Genital warts    Dx about 30 years ago    GERD (gastroesophageal reflux  disease)    Hemorrhoids    History of hiatal hernia    History of kidney stones    Hx of colonic polyp    Hx: UTI (urinary tract infection)    Hypertension    Osteoporosis    Pneumonia    Proctitis 11/12/2011   Skin cancer    basal cell   Urinary incontinence    On occasion     Social History   Tobacco Use   Smoking status: Former    Current packs/day: 0.00    Types: Cigarettes    Start date: 10/04/1977    Quit date: 10/05/2007    Years since quitting: 16.3   Smokeless tobacco: Never  Vaping Use   Vaping status: Never Used  Substance Use Topics   Alcohol  use: Yes    Comment: occasional wine   Drug use: No    Ms.Noy reports that she quit smoking about 16 years ago. Her smoking use included cigarettes. She started smoking about 46 years ago. She has never used smokeless tobacco. She reports current alcohol  use. She reports that she does not use drugs.  Tobacco Cessation: Counseling given: Not Answered Former smoker with a 30 pack year smoking history, quit 2009  Past surgical hx, Family hx, Social hx all reviewed.  Current Outpatient Medications on File Prior to Visit  Medication Sig   acetaminophen  (TYLENOL ) 500 MG tablet Take 1,000 mg by mouth as needed for mild pain (pain score 1-3) or moderate pain (pain score 4-6).   aspirin  EC 325 MG tablet Take 325 mg by mouth as needed for moderate pain (pain score 4-6) or mild pain (pain score 1-3).   BREZTRI  AEROSPHERE 160-9-4.8 MCG/ACT AERO inhaler Inhale 2 puffs into the lungs.   calcium carbonate (TUMS - DOSED IN MG ELEMENTAL CALCIUM) 500 MG chewable tablet Chew 1 tablet by mouth as needed for indigestion or heartburn.   carboxymethylcellulose (REFRESH PLUS) 0.5 % SOLN Place 2 drops into both eyes 2 (two) times daily as needed (dry/irritated eyes).   cetirizine (ZYRTEC) 10 MG tablet Take 10 mg by mouth daily as needed for allergies.    chlorthalidone  (HYGROTON ) 25 MG tablet Take 25 mg by mouth every other day.    Cholecalciferol (VITAMIN D3) 50 MCG (2000 UT) TABS Take 2,000 Units by mouth in the morning.   Cyanocobalamin  (VITAMIN B12 PO) Take 1 tablet by mouth in the morning. daily   diltiazem  (CARDIZEM  CD) 120 MG 24 hr capsule TAKE 1 CAPSULE BY MOUTH DAILY   ezetimibe  (ZETIA ) 10 MG tablet Take 10 mg by mouth in the morning.   FOLIC ACID  PO Take 1 tablet by mouth in the morning.   MELATONIN PO Take 8 mg by mouth at bedtime.   Omega-3 Fatty Acids (FISH OIL ULTRA) 1400 MG  CAPS Take 1,400 mg by mouth at bedtime.   pantoprazole  (PROTONIX ) 40 MG tablet 1 tablet Orally Once a day; Duration: 90 days As needed   potassium chloride  (KLOR-CON  M) 10 MEQ tablet Take 10 mEq by mouth 2 (two) times daily.   sulfaSALAzine  (AZULFIDINE ) 500 MG EC tablet Take 2 tablets (1,000 mg total) by mouth 2 (two) times daily.   VENTOLIN  HFA 108 (90 BASE) MCG/ACT inhaler Inhale 1 puff into the lungs as needed for wheezing or shortness of breath.    Wheat Dextrin (BENEFIBER PO) Take 1 Dose by mouth in the morning.   zoledronic  acid (RECLAST ) 5 MG/100ML SOLN injection Inject 5 mg into the vein once. yearly   ferrous sulfate 325 (65 FE) MG EC tablet Take 325 mg by mouth in the morning. (Patient not taking: Reported on 01/20/2024)   No current facility-administered medications on file prior to visit.     Allergies  Allergen Reactions   Cephalexin      Other reaction(s): rash on chest/hives    Review Of Systems:  Constitutional:   No  weight loss, night sweats,  Fevers, chills, fatigue, or  lassitude.  HEENT:   No headaches,  Difficulty swallowing,  Tooth/dental problems, or  Sore throat,                No sneezing, itching, ear ache, nasal congestion, post nasal drip,   CV:  No chest pain,  Orthopnea, PND, swelling in lower extremities, anasarca, dizziness, palpitations, syncope.   GI  No heartburn, indigestion, abdominal pain, nausea, vomiting, diarrhea, change in bowel habits, loss of appetite, bloody stools.   Resp: No  shortness of breath with exertion or at rest.  No excess mucus, no productive cough,  No non-productive cough,  No coughing up of blood.  No change in color of mucus.  No wheezing.  No chest wall deformity  Skin: no rash or lesions.  GU: no dysuria, change in color of urine, no urgency or frequency.  No flank pain, no hematuria   MS:  No joint pain or swelling.  No decreased range of motion.  No back pain.  Psych:  No change in mood or affect. No depression or anxiety.  No memory loss.   Vital Signs BP 122/66   Pulse 88   Temp 97.7 F (36.5 C) (Oral)   Ht 5' 1 (1.549 m)   Wt 138 lb (62.6 kg)   SpO2 95%   BMI 26.07 kg/m    Physical Exam:  General- No distress,  A&Ox3, pleasant ENT: No sinus tenderness, TM clear, pale nasal mucosa, no oral exudate,no post nasal drip, no LAN Cardiac: S1, S2, regular rate and rhythm, no murmur Chest: No wheeze/ rales/ dullness; no accessory muscle use, no nasal flaring, no sternal retractions, diminished per bases Abd.: Soft Non-tender, ND, BS +, Body mass index is 26.07 kg/m.  Ext: No clubbing cyanosis, edema, no obvious deformities Neuro:  normal strength, MAE x 4, A&O x 3, appropriate Skin: No rashes, warm and dry, no obvious skin lesions  Psych: normal mood and behavior   Assessment & Plan Chronic obstructive pulmonary disease (COPD) with emphysema Severe COPD with emphysema, severe obstructive airways disease, mild diffusion reduction, no significant bronchodilator response, stable symptoms. - Continue Breztri , two puffs twice daily. - Use albuterol  as needed. - Seek medical attention for exacerbation signs, such as increased cough or dark/green secretions.  Pulmonary nodules under surveillance Stable to improved pulmonary nodules, ground glass nodules likely infectious or inflammatory,  continued surveillance for resolution and malignancy monitoring. - Order follow-up CT scan in six months. - Schedule follow-up appointment 1-2 weeks  post-CT scan to review results.  I spent 30 minutes dedicated to the care of this patient on the date of this encounter to include pre-visit review of records, face-to-face time with the patient discussing conditions above, post visit ordering of testing, clinical documentation with the electronic health record, making appropriate referrals as documented, and communicating necessary information to the patient's healthcare team.    AVS 01/20/2024 We have reviewed your CT scan which shows improvement over previous scans. There are still some areas that we want to follow until complete resolution. We will do a CT scan in 6 months, which will be March 2026. You will get a call to get this scheduled. You will follow-up with me 1 to 2 weeks after the scan so we can review the results. We have reviewed your pulmonary function testing. This shows severe COPD, with a mild diffusion defect. Continue taking your Breztri  2 puffs twice daily as you have been doing. Rinse mouth after use. Use albuterol  as needed for breakthrough shortness of breath or wheezing. Note your daily symptoms > remember red flags for COPD:  Increase in cough, increase in sputum production, increase in shortness of breath or activity intolerance. If you notice these symptoms, please call to be seen.  Please contact office for sooner follow up if symptoms do not improve or worsen or seek emergency care   Have a great holiday season.    Lauraine JULIANNA Lites, NP 01/20/2024  1:38 PM

## 2024-01-21 DIAGNOSIS — Z961 Presence of intraocular lens: Secondary | ICD-10-CM | POA: Diagnosis not present

## 2024-01-21 DIAGNOSIS — D3131 Benign neoplasm of right choroid: Secondary | ICD-10-CM | POA: Diagnosis not present

## 2024-01-21 DIAGNOSIS — H1789 Other corneal scars and opacities: Secondary | ICD-10-CM | POA: Diagnosis not present

## 2024-01-21 DIAGNOSIS — H52203 Unspecified astigmatism, bilateral: Secondary | ICD-10-CM | POA: Diagnosis not present

## 2024-02-04 ENCOUNTER — Ambulatory Visit: Admitting: Internal Medicine

## 2024-02-11 ENCOUNTER — Other Ambulatory Visit: Payer: Self-pay | Admitting: Internal Medicine

## 2024-03-19 DIAGNOSIS — M810 Age-related osteoporosis without current pathological fracture: Secondary | ICD-10-CM | POA: Diagnosis not present

## 2024-03-19 DIAGNOSIS — Z1231 Encounter for screening mammogram for malignant neoplasm of breast: Secondary | ICD-10-CM | POA: Diagnosis not present

## 2024-04-12 ENCOUNTER — Ambulatory Visit: Admitting: Internal Medicine

## 2024-07-19 ENCOUNTER — Ambulatory Visit: Admitting: Internal Medicine
